# Patient Record
Sex: Male | Born: 1975 | Race: Black or African American | Hispanic: No | Smoking: Current every day smoker
Health system: Southern US, Community
[De-identification: ages and names within clinical notes are randomized; demographics above are authoritative.]

## PROBLEM LIST (undated history)

## (undated) DIAGNOSIS — I10 Essential (primary) hypertension: Secondary | ICD-10-CM

## (undated) DIAGNOSIS — F419 Anxiety disorder, unspecified: Secondary | ICD-10-CM

## (undated) DIAGNOSIS — M545 Low back pain, unspecified: Secondary | ICD-10-CM

## (undated) DIAGNOSIS — F32A Depression, unspecified: Secondary | ICD-10-CM

## (undated) DIAGNOSIS — G4700 Insomnia, unspecified: Secondary | ICD-10-CM

## (undated) HISTORY — DX: Depression, unspecified: F32.A

## (undated) HISTORY — DX: Insomnia, unspecified: G47.00

## (undated) HISTORY — DX: Anxiety disorder, unspecified: F41.9

---

## 1999-10-08 ENCOUNTER — Emergency Department (HOSPITAL_COMMUNITY): Admission: EM | Admit: 1999-10-08 | Discharge: 1999-10-08 | Payer: Self-pay | Admitting: Emergency Medicine

## 2000-02-02 ENCOUNTER — Emergency Department (HOSPITAL_COMMUNITY): Admission: EM | Admit: 2000-02-02 | Discharge: 2000-02-02 | Payer: Self-pay | Admitting: Emergency Medicine

## 2001-05-17 ENCOUNTER — Emergency Department (HOSPITAL_COMMUNITY): Admission: EM | Admit: 2001-05-17 | Discharge: 2001-05-17 | Payer: Self-pay | Admitting: Emergency Medicine

## 2002-07-09 ENCOUNTER — Emergency Department (HOSPITAL_COMMUNITY): Admission: EM | Admit: 2002-07-09 | Discharge: 2002-07-09 | Payer: Self-pay | Admitting: Emergency Medicine

## 2002-09-24 ENCOUNTER — Encounter: Admission: RE | Admit: 2002-09-24 | Discharge: 2002-09-24 | Payer: Self-pay | Admitting: Family Medicine

## 2003-04-26 ENCOUNTER — Emergency Department (HOSPITAL_COMMUNITY): Admission: AD | Admit: 2003-04-26 | Discharge: 2003-04-26 | Payer: Self-pay | Admitting: Family Medicine

## 2006-07-03 ENCOUNTER — Emergency Department (HOSPITAL_COMMUNITY): Admission: EM | Admit: 2006-07-03 | Discharge: 2006-07-03 | Payer: Self-pay | Admitting: Emergency Medicine

## 2006-07-13 DIAGNOSIS — R42 Dizziness and giddiness: Secondary | ICD-10-CM

## 2007-11-04 ENCOUNTER — Emergency Department (HOSPITAL_COMMUNITY): Admission: EM | Admit: 2007-11-04 | Discharge: 2007-11-04 | Payer: Self-pay | Admitting: Emergency Medicine

## 2011-12-20 ENCOUNTER — Emergency Department (HOSPITAL_COMMUNITY): Payer: Self-pay

## 2011-12-20 ENCOUNTER — Encounter (HOSPITAL_COMMUNITY): Payer: Self-pay | Admitting: *Deleted

## 2011-12-20 DIAGNOSIS — I10 Essential (primary) hypertension: Secondary | ICD-10-CM | POA: Insufficient documentation

## 2011-12-20 DIAGNOSIS — M545 Low back pain, unspecified: Secondary | ICD-10-CM | POA: Insufficient documentation

## 2011-12-20 DIAGNOSIS — F17200 Nicotine dependence, unspecified, uncomplicated: Secondary | ICD-10-CM | POA: Insufficient documentation

## 2011-12-20 DIAGNOSIS — R0602 Shortness of breath: Secondary | ICD-10-CM | POA: Insufficient documentation

## 2011-12-20 DIAGNOSIS — R079 Chest pain, unspecified: Principal | ICD-10-CM | POA: Insufficient documentation

## 2011-12-20 LAB — CBC
Hemoglobin: 15.6 g/dL (ref 13.0–17.0)
MCV: 90.8 fL (ref 78.0–100.0)
Platelets: 220 10*3/uL (ref 150–400)
RDW: 12.2 % (ref 11.5–15.5)
WBC: 6.8 10*3/uL (ref 4.0–10.5)

## 2011-12-20 NOTE — ED Notes (Signed)
Pt c/o left sided CP that radiates to the right for the pain 1-2 weeks. Associated with SOB, denies n/v, dizziness, diaphoresis.

## 2011-12-21 ENCOUNTER — Observation Stay (HOSPITAL_COMMUNITY)
Admission: EM | Admit: 2011-12-21 | Discharge: 2011-12-21 | Discharge status: Against Medical Advice | Payer: Self-pay | Attending: Emergency Medicine | Admitting: Emergency Medicine

## 2011-12-21 ENCOUNTER — Encounter (HOSPITAL_COMMUNITY): Payer: Self-pay | Admitting: Radiology

## 2011-12-21 ENCOUNTER — Emergency Department (HOSPITAL_COMMUNITY): Payer: Self-pay

## 2011-12-21 DIAGNOSIS — Z720 Tobacco use: Secondary | ICD-10-CM | POA: Diagnosis present

## 2011-12-21 DIAGNOSIS — F1020 Alcohol dependence, uncomplicated: Secondary | ICD-10-CM

## 2011-12-21 DIAGNOSIS — R030 Elevated blood-pressure reading, without diagnosis of hypertension: Secondary | ICD-10-CM

## 2011-12-21 DIAGNOSIS — IMO0001 Reserved for inherently not codable concepts without codable children: Secondary | ICD-10-CM | POA: Diagnosis present

## 2011-12-21 DIAGNOSIS — F17200 Nicotine dependence, unspecified, uncomplicated: Secondary | ICD-10-CM

## 2011-12-21 DIAGNOSIS — R079 Chest pain, unspecified: Secondary | ICD-10-CM

## 2011-12-21 HISTORY — DX: Low back pain: M54.5

## 2011-12-21 HISTORY — DX: Essential (primary) hypertension: I10

## 2011-12-21 HISTORY — DX: Chest pain, unspecified: R07.9

## 2011-12-21 HISTORY — DX: Low back pain, unspecified: M54.50

## 2011-12-21 LAB — POCT I-STAT TROPONIN I: Troponin i, poc: 0 ng/mL (ref 0.00–0.08)

## 2011-12-21 LAB — BASIC METABOLIC PANEL
BUN: 12 mg/dL (ref 6–23)
CO2: 29 mEq/L (ref 19–32)
Calcium: 9.7 mg/dL (ref 8.4–10.5)
Chloride: 98 mEq/L (ref 96–112)

## 2011-12-21 MED ORDER — IOHEXOL 350 MG/ML SOLN
100.0000 mL | Freq: Once | INTRAVENOUS | Status: AC | PRN
  Administered 2011-12-21: 100 mL via INTRAVENOUS

## 2011-12-21 NOTE — ED Provider Notes (Signed)
History     CSN: [id number]  Arrival date & time 12/20/11  2324   First MD Initiated Contact with Patient 12/21/11 0221      Chief Complaint  Patient presents with  . Chest Pain    (Consider location/radiation/quality/duration/timing/severity/associated sxs/prior treatment) HPI History provided by patient. Last week has been having off chest pains described as tightness across his chest. Has some associated shortness of breath. No leg pain or swelling. No significant cough. No fevers or chills. No recent illness. No recent travel or surgery or hospitalizations. Is a smoker no history of asthma or COPD. No history of similar symptoms. No history of heart problems. Past Medical History  Diagnosis Date  . Lower back pain   . Hypertension     History reviewed. No pertinent past surgical history.  History reviewed. No pertinent family history.  History  Substance Use Topics  . Smoking status: Current Everyday Smoker -- 1.0 packs/day  . Smokeless tobacco: Not on file  . Alcohol Use: Yes      Review of Systems  Constitutional: Negative for fever and chills.  HENT: Negative for neck pain and neck stiffness.   Eyes: Negative for pain.  Respiratory: Positive for chest tightness.   Cardiovascular: Positive for chest pain. Negative for palpitations and leg swelling.  Gastrointestinal: Negative for abdominal pain.  Genitourinary: Negative for dysuria.  Musculoskeletal: Negative for back pain.  Skin: Negative for rash.  Neurological: Negative for headaches.  All other systems reviewed and are negative.    Allergies  Review of patient's allergies indicates no known allergies.  Home Medications   Current Outpatient Rx  Name Route Sig Dispense Refill  . OXYCODONE-ACETAMINOPHEN 10-325 MG PO TABS Oral Take 1 tablet by mouth every 4 (four) hours as needed. For pain      BP 147/88  Pulse 85  Temp 98.6 F (37 C) (Oral)  Resp 17  SpO2 100%  Physical Exam  Constitutional:  He is oriented to person, place, and time. He appears well-developed and well-nourished.  HENT:  Head: Normocephalic and atraumatic.  Eyes: Conjunctivae and EOM are normal. Pupils are equal, round, and reactive to light.  Neck: Trachea normal. Neck supple. No thyromegaly present.  Cardiovascular: Regular rhythm, S1 normal, S2 normal and normal pulses.     No systolic murmur is present   No diastolic murmur is present  Pulses:      Radial pulses are 2+ on the right side, and 2+ on the left side.       Mild tachycardia  Pulmonary/Chest: Effort normal and breath sounds normal. He has no wheezes. He has no rhonchi. He has no rales. He exhibits no tenderness.  Abdominal: Soft. Normal appearance and bowel sounds are normal. There is no tenderness. There is no CVA tenderness and negative Murphy's sign.  Musculoskeletal:       BLE:s Calves nontender, no cords or erythema, negative Homans sign  Neurological: He is alert and oriented to person, place, and time. He has normal strength. No cranial nerve deficit or sensory deficit. GCS eye subscore is 4. GCS verbal subscore is 5. GCS motor subscore is 6.  Skin: Skin is warm and dry. No rash noted. He is not diaphoretic.  Psychiatric: His speech is normal.       Cooperative and appropriate    ED Course  Procedures (including critical care time)  Results for orders placed during the hospital encounter of 12/21/11  CBC      Component Value Range  WBC 6.8  4.0 - 10.5 K/uL   RBC 4.78  4.22 - 5.81 MIL/uL   Hemoglobin 15.6  13.0 - 17.0 g/dL   HCT 16.1  09.6 - 04.5 %   MCV 90.8  78.0 - 100.0 fL   MCH 32.6  26.0 - 34.0 pg   MCHC 35.9  30.0 - 36.0 g/dL   RDW 40.9  81.1 - 91.4 %   Platelets 220  150 - 400 K/uL  BASIC METABOLIC PANEL      Component Value Range   Sodium 136  135 - 145 mEq/L   Potassium 3.9  3.5 - 5.1 mEq/L   Chloride 98  96 - 112 mEq/L   CO2 29  19 - 32 mEq/L   Glucose, Bld 103 (*) 70 - 99 mg/dL   BUN 12  6 - 23 mg/dL    Creatinine, Ser 7.82  0.50 - 1.35 mg/dL   Calcium 9.7  8.4 - 95.6 mg/dL   GFR calc non Af Amer 69 (*) >90 mL/min   GFR calc Af Amer 80 (*) >90 mL/min  POCT I-STAT TROPONIN I      Component Value Range   Troponin i, poc 0.00  0.00 - 0.08 ng/mL   Comment 3            Dg Chest 2 View  12/20/2011  *RADIOLOGY REPORT*  Clinical Data: Chest pain, shortness of breath, hypertension  CHEST - 2 VIEW  Comparison: 07/03/2006  Findings: Slight central bronchial changes and interstitial prominence.  Low lung volumes evident.  No focal pneumonia, edema, collapse, consolidation, effusion or pneumothorax.  Trachea midline.  Normal heart size and vascularity.  IMPRESSION: Low volume exam with central bronchitic changes.  Original Report Authenticated By: Judie Petit. Ruel Favors, M.D.      Date: 12/21/2011  Rate: 105  Rhythm: sinus tachycardia  QRS Axis: normal  Intervals: normal  ST/T Wave abnormalities: nonspecific ST/T changes  Conduction Disutrbances:none  Narrative Interpretation: LVH  Old EKG Reviewed: none available   EKG reviewed with cardiologist Dr. Lurline Idol feels this is likely LVH related changes and recommends admit likely need stress echo.  EKG labs and imaging reviewed as above. Nursing consult for admission.  5:03 AM d/w Dr Toniann Fail who agrees to admit  MDM   Chest pain evaluated with EKG labs and chest x-ray. Troponins negative.  abnormal EKG. Plan Medical admission.        Sunnie Nielsen, MD 12/21/11 [phone]

## 2011-12-21 NOTE — ED Notes (Signed)
Pt requesting to sign out AMA stating he was not prepared to stay due to having kids and needing to work. Explained to pt why the Dr wanted him to stay for 24 hr observation, pt stated he still wanted to sign out, Dr Dierdre Highman notified.

## 2011-12-21 NOTE — ED Notes (Signed)
Pt signed out AMA stating he was not prepared to stay now but he would come back if he started hurting again. Pt informed of risk of leaving, pt verbalized understanding of these risk. Pt told to follow up with his PCP for possible referral to cardiologist and to return if he beings to have chest pain again. Pt verbalized understanding of these instructions.

## 2011-12-21 NOTE — H&P (Signed)
Jonathan [name] is an 36 y.o. male.   PCP - none. Chief Complaint: Chest pain. HPI: 36 year old male with history of chronic low back pain on pain relief medications present with complaints of chest pain. Patient has been experiencing chest pain all last week and half. The chest pain is more on the left anterior chest wall nonradiating pressure-like has no relation to exertion and no associated shortness of breath. Since it is becoming more frequent he decided to come to the ER. In the ER cardiac enzymes are negative EKG was showing features of LVH. Patient at this time is admitted for further workup. Patient denies any nausea vomiting abdominal pain dizziness or loss of consciousness.    Past Medical History  Diagnosis Date  . Lower back pain   . Hypertension     History reviewed. No pertinent past surgical history.  Family History  Problem Relation Age of Onset  . Hypertension Other    Social History:  reports that he has been smoking.  He does not have any smokeless tobacco history on file. He reports that he drinks alcohol. He reports that he uses illicit drugs (Marijuana).  Allergies: No Known Allergies   (Not in a hospital admission)  Results for orders placed during the hospital encounter of 12/21/11 (from the past 48 hour(s))  CBC     Status: Normal   Collection Time   12/20/11 11:32 PM      Component Value Range Comment   WBC 6.8  4.0 - 10.5 K/uL    RBC 4.78  4.22 - 5.81 MIL/uL    Hemoglobin 15.6  13.0 - 17.0 g/dL    HCT 96.0  45.4 - 09.8 %    MCV 90.8  78.0 - 100.0 fL    MCH 32.6  26.0 - 34.0 pg    MCHC 35.9  30.0 - 36.0 g/dL    RDW 11.9  14.7 - 82.9 %    Platelets 220  150 - 400 K/uL   BASIC METABOLIC PANEL     Status: Abnormal   Collection Time   12/20/11 11:32 PM      Component Value Range Comment   Sodium 136  135 - 145 mEq/L    Potassium 3.9  3.5 - 5.1 mEq/L    Chloride 98  96 - 112 mEq/L    CO2 29  19 - 32 mEq/L    Glucose, Bld 103 (*) 70 - 99 mg/dL    BUN 12  6 - 23 mg/dL    Creatinine, Ser 5.62  0.50 - 1.35 mg/dL    Calcium 9.7  8.4 - 13.0 mg/dL    GFR calc non Af Amer 69 (*) >90 mL/min    GFR calc Af Amer 80 (*) >90 mL/min   POCT I-STAT TROPONIN I     Status: Normal   Collection Time   12/20/11 11:38 PM      Component Value Range Comment   Troponin i, poc 0.00  0.00 - 0.08 ng/mL    Comment 3            POCT I-STAT TROPONIN I     Status: Normal   Collection Time   12/21/11  3:07 AM      Component Value Range Comment   Troponin i, poc 0.00  0.00 - 0.08 ng/mL    Comment 3             Dg Chest 2 View  12/20/2011  *RADIOLOGY REPORT*  Clinical Data:  Chest pain, shortness of breath, hypertension  CHEST - 2 VIEW  Comparison: 07/03/2006  Findings: Slight central bronchial changes and interstitial prominence.  Low lung volumes evident.  No focal pneumonia, edema, collapse, consolidation, effusion or pneumothorax.  Trachea midline.  Normal heart size and vascularity.  IMPRESSION: Low volume exam with central bronchitic changes.  Original Report Authenticated By: Judie Petit. Ruel Favors, M.D.   Ct Angio Chest Pe W/cm &/or Wo Cm  12/21/2011  *RADIOLOGY REPORT*  Clinical Data: Left-sided chest pain radiating to the right side for 1 - 2 weeks.  CT ANGIOGRAPHY CHEST  Technique:  Multidetector CT imaging of the chest using the standard protocol during bolus administration of intravenous contrast. Multiplanar reconstructed images including MIPs were obtained and reviewed to evaluate the vascular anatomy.  Contrast: OMNIPAQUE IOHEXOL 350 MG/ML SOLN  Comparison: None.  Findings: Technically adequate study with good opacification of the central and segmental pulmonary arteries.  No focal filling defects.  No evidence of significant pulmonary embolus.  Normal heart size.  Normal caliber thoracic aorta.  Mild infiltration in the anterior mediastinal fat likely represents residual thymic tissue.  No significant lymphadenopathy in the chest.  Esophagus is decompressed.   No pleural effusions.  Dependent atelectatic changes in the lung bases.  Scattered emphysematous changes.  No focal airspace consolidation.  No interstitial changes.  No pneumothorax. Airways appear patent.  IMPRESSION: No evidence of significant pulmonary embolus.  Original Report Authenticated By: Marlon Pel, M.D.    ROS  Blood pressure 144/86, pulse 76, temperature 98.6 F (37 C), temperature source Oral, resp. rate 16, SpO2 99.00%. Physical Exam   Assessment/Plan #1. Chest pain to rule out ACS - cycle cardiac markers. 2-D echo. Aspirin. When necessary nitroglycerin. #2. Elevated blood pressure - closely monitor blood pressure. If it stands elevated then may definite antihypertensives. #3. Tobacco abuse - strongly advised to quit smoking. #4. Alcoholism - patient will be placed on alcohol withdrawal protocol.  CODE STATUS - full code.  Natasa Stigall N. 12/21/2011, 5:45 AM

## 2013-04-27 ENCOUNTER — Encounter (HOSPITAL_COMMUNITY): Payer: Self-pay | Admitting: Emergency Medicine

## 2013-04-27 ENCOUNTER — Emergency Department (INDEPENDENT_AMBULATORY_CARE_PROVIDER_SITE_OTHER): Payer: Self-pay

## 2013-04-27 ENCOUNTER — Emergency Department (HOSPITAL_COMMUNITY): Payer: Self-pay

## 2013-04-27 ENCOUNTER — Emergency Department (INDEPENDENT_AMBULATORY_CARE_PROVIDER_SITE_OTHER)
Admission: EM | Admit: 2013-04-27 | Discharge: 2013-04-27 | Disposition: A | Discharge status: Home / Self Care | Payer: Self-pay | Source: Home / Self Care | Attending: Family Medicine | Admitting: Family Medicine

## 2013-04-27 DIAGNOSIS — S62309A Unspecified fracture of unspecified metacarpal bone, initial encounter for closed fracture: Secondary | ICD-10-CM

## 2013-04-27 DIAGNOSIS — S62339A Displaced fracture of neck of unspecified metacarpal bone, initial encounter for closed fracture: Secondary | ICD-10-CM

## 2013-04-27 NOTE — ED Notes (Signed)
Patient punched a wall with his right hand approx 2 days ago

## 2013-04-27 NOTE — ED Provider Notes (Signed)
Jonathan [name] is a 37 y.o. male who presents to Urgent Care today for right hand injury. Patient punched a wall about 2 days ago. He suspects that he is a fracture at the fifth metacarpal. He notes that initially it was displaced downward that he was able to push it up to normal position after the injury. He notes pain and swelling but otherwise feels well.  No radiating pain weakness or numbness   Past Medical History  Diagnosis Date  . Lower back pain   . Hypertension    History  Substance Use Topics  . Smoking status: Current Every Day Smoker -- 1.00 packs/day  . Smokeless tobacco: Not on file  . Alcohol Use: Yes   ROS as above Medications reviewed. No current facility-administered medications for this encounter.   No current outpatient prescriptions on file.    Exam:  BP 138/84  Pulse 81  Resp 18 Gen: Well NAD RIGHT HAND SWELLING AND TENDERNESS AT THE DISTAL FIFTH METACARPAL. No angular deformity. Does not appear to be displaced. No rotational deformity noted. Hand motion sensation and capillary refill are intact  No results found for this or any previous visit (from the past 24 hour(s)). Dg Hand Complete Right  04/27/2013   CLINICAL DATA:  Punching injury, pain/swelling to the 5th metacarpal  EXAM: RIGHT HAND - COMPLETE 3+ VIEW  COMPARISON:  None.  FINDINGS: Mildly comminuted, angulated fracture of the distal 5th metacarpal. Apex dorsal angulation.  No additional fracture is seen.  Associated dorsal/ulnar soft tissue swelling.  IMPRESSION: Mildly comminuted distal 5th metacarpal fracture.   Electronically Signed   By: Charline Bills M.D.   On: 04/27/2013 19:51    Patient was placed into a well-formed ulnar gutter splint in an anatomical hand position.  Assessment and Plan: 37 y.o. male with minimally radially angulated comminuted fifth metacarpal boxer's fracture. Patient was placed into an ulnar gutter splint and will followup with orthopedics in about one  week. Discussed warning signs or symptoms. Please see discharge instructions. Patient expresses understanding.      Rodolph Bong, MD 04/27/13 2025

## 2016-06-11 ENCOUNTER — Encounter (HOSPITAL_COMMUNITY): Payer: Self-pay | Admitting: Family Medicine

## 2016-06-11 ENCOUNTER — Ambulatory Visit (HOSPITAL_COMMUNITY)
Admission: EM | Admit: 2016-06-11 | Discharge: 2016-06-11 | Disposition: A | Discharge status: Home / Self Care | Payer: Self-pay | Attending: Family Medicine | Admitting: Family Medicine

## 2016-06-11 DIAGNOSIS — M542 Cervicalgia: Secondary | ICD-10-CM
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
## 2018-07-18 ENCOUNTER — Ambulatory Visit (HOSPITAL_COMMUNITY)
Admission: EM | Admit: 2018-07-18 | Discharge: 2018-07-18 | Disposition: A | Discharge status: Home / Self Care | Payer: Self-pay | Attending: Family Medicine | Admitting: Family Medicine

## 2018-07-18 ENCOUNTER — Ambulatory Visit (INDEPENDENT_AMBULATORY_CARE_PROVIDER_SITE_OTHER): Payer: Self-pay

## 2018-07-18 ENCOUNTER — Encounter (HOSPITAL_COMMUNITY): Payer: Self-pay | Admitting: Emergency Medicine

## 2018-07-18 DIAGNOSIS — J189 Pneumonia, unspecified organism: Secondary | ICD-10-CM

## 2018-07-18 MED ORDER — ALBUTEROL SULFATE HFA 108 (90 BASE) MCG/ACT IN AERS
2.0000 | INHALATION_SPRAY | Freq: Once | RESPIRATORY_TRACT | Status: AC
  Administered 2018-07-18: 2 via RESPIRATORY_TRACT

## 2018-07-18 MED ORDER — IPRATROPIUM-ALBUTEROL 0.5-2.5 (3) MG/3ML IN SOLN
3.0000 mL | Freq: Once | RESPIRATORY_TRACT | Status: AC
  Administered 2018-07-18: 3 mL via RESPIRATORY_TRACT

## 2018-07-18 MED ORDER — ALBUTEROL SULFATE HFA 108 (90 BASE) MCG/ACT IN AERS
INHALATION_SPRAY | RESPIRATORY_TRACT | Status: AC
  Filled 2018-07-18: qty 6.7

## 2018-07-18 MED ORDER — IPRATROPIUM-ALBUTEROL 0.5-2.5 (3) MG/3ML IN SOLN
RESPIRATORY_TRACT | Status: AC
  Filled 2018-07-18: qty 3

## 2018-07-18 MED ORDER — LEVOFLOXACIN 750 MG PO TABS: 750.0000 mg | ORAL_TABLET | Freq: Every day | ORAL | 0 refills | Status: DC

## 2018-07-18 NOTE — ED Notes (Signed)
Dr. Tracie Harrier at bedside

## 2018-07-18 NOTE — ED Triage Notes (Signed)
Pt c/o cough, chest congestion x1 week, fatigue. Saturations 91% on RA, 94% after takign some deep breaths.

## 2018-07-19 NOTE — ED Provider Notes (Signed)
[facility]   [id number] 07/18/18 Arrival Time: 1550  ASSESSMENT & PLAN:  1. Pneumonia of both lungs due to infectious organism, unspecified part of lung    I have personally viewed the imaging studies ordered this visit. Bilateral multifocal PNA.  Discussed ED evaluation but after neb, O2 sats stable at 95%. No respiratory distress.  Meds ordered this encounter  Medications  . ipratropium-albuterol (DUONEB) 0.5-2.5 (3) MG/3ML nebulizer solution 3 mL  . albuterol (PROVENTIL HFA;VENTOLIN HFA) 108 (90 Base) MCG/ACT inhaler 2 puff  . levofloxacin (LEVAQUIN) 750 MG tablet    Sig: Take 1 tablet (750 mg total) by mouth daily.    Dispense:  7 tablet    Refill:  0   Agrees to begin taking Levaquin tonight. Will use inhaler as needed. Agrees to strict return precautions; to ED with any worsening.  Will f/u here in 36 hours for recheck.  Reviewed expectations re: course of current medical issues. Questions answered. Outlined signs and symptoms indicating need for more acute intervention. Patient verbalized understanding. After Visit Summary given.   SUBJECTIVE:  History from: patient. Jonathan [name] is a 43 y.o. male who presents with complaint of persistent coughing and "feeling bad" in general for the past week. Nasal congestion. Chest beginning to hurt from coughing. Over the past two days has felt SOB. No wheezing reported. SOB worse with exertion. No back pain. Does not think he has run a fever but does report chills at night. Difficulty sleeping secondary to coughing. Is a smoker. No abdominal pain. Tolerating PO intake without n/v. No LE edema. Ambulatory without assistance. No recent travel. No known sick contacts. No specific aggravating or alleviating factors reported except those listed above. No OTC treatment. No h/o frequent infections or lung problems.  ROS: As per HPI. All other systems negative.   OBJECTIVE:  Vitals:   07/18/18 1704 07/18/18 1706  BP:  110/67   Pulse: 83   Resp:  (!) 22  Temp: 99.1 F (37.3 C)   SpO2: 91% 94%    General appearance: appears fatigued Eyes: PERRLA; EOMI; conjunctivae normal HENT: normocephalic; atraumatic; oropharynx moist Neck: supple with FROM Lungs: initially with somewhat labored respirations that have improved after DuoNeb treatment here; very coarse breath sounds diffusely; mild expiratory wheezing Heart: regular rate and rhythm without murmer Abdomen: soft, non-tender Extremities: without edema; without calf swelling or tenderness; symmetrical without gross deformities Skin: warm and dry; without rash or lesions Psychological: alert and cooperative; normal mood and affect  Imaging: Dg Chest 2 View  Result Date: 07/18/2018 CLINICAL DATA:  Productive cough and fever. EXAM: CHEST - 2 VIEW COMPARISON:  12/20/2011 FINDINGS: The heart size and mediastinal contours are within normal limits. Extensive patchy airspace disease is seen throughout both lungs with some associated bronchial thickening. Findings are likely consistent with multifocal pneumonia. No overt pulmonary edema or pleural fluid. No pneumothorax. The visualized skeletal structures are unremarkable. IMPRESSION: Evidence of multifocal bilateral pneumonia. Electronically Signed   By: Irish Lack M.D.   On: 07/18/2018 17:54     No Known Allergies  Past Medical History:  Diagnosis Date  . Hypertension   . Lower back pain    Social History   Socioeconomic History  . Marital status: Married    Spouse name: Not on file  . Number of children: Not on file  . Years of education: Not on file  . Highest education level: Not on file  Occupational History  . Not on file  Social  Needs  . Financial resource strain: Not on file  . Food insecurity:    Worry: Not on file    Inability: Not on file  . Transportation needs:    Medical: Not on file    Non-medical: Not on file  Tobacco Use  . Smoking status: Current Every Day Smoker     Packs/day: 1.00  . Smokeless tobacco: Never Used  Substance and Sexual Activity  . Alcohol use: Yes  . Drug use: Yes    Types: Marijuana  . Sexual activity: Not on file  Lifestyle  . Physical activity:    Days per week: Not on file    Minutes per session: Not on file  . Stress: Not on file  Relationships  . Social connections:    Talks on phone: Not on file    Gets together: Not on file    Attends religious service: Not on file    Active member of club or organization: Not on file    Attends meetings of clubs or organizations: Not on file    Relationship status: Not on file  . Intimate partner violence:    Fear of current or ex partner: Not on file    Emotionally abused: Not on file    Physically abused: Not on file    Forced sexual activity: Not on file  Other Topics Concern  . Not on file  Social History Narrative  . Not on file   Family History  Problem Relation Age of Onset  . Hypertension Other    History reviewed. No pertinent surgical history.   Mardella Layman, MD 07/19/18 1226

## 2021-06-21 ENCOUNTER — Other Ambulatory Visit: Payer: Self-pay

## 2021-06-21 ENCOUNTER — Ambulatory Visit (HOSPITAL_COMMUNITY)
Admission: EM | Admit: 2021-06-21 | Discharge: 2021-06-21 | Disposition: A | Discharge status: Home / Self Care | Payer: 59 | Attending: Family Medicine | Admitting: Family Medicine

## 2021-06-21 ENCOUNTER — Encounter (HOSPITAL_COMMUNITY): Payer: Self-pay | Admitting: Emergency Medicine

## 2021-06-21 DIAGNOSIS — I10 Essential (primary) hypertension: Secondary | ICD-10-CM

## 2021-06-21 DIAGNOSIS — R200 Anesthesia of skin: Secondary | ICD-10-CM

## 2021-06-21 MED ORDER — TRAZODONE HCL 50 MG PO TABS: 25.0000 mg | ORAL_TABLET | Freq: Every day | ORAL | 0 refills | Status: DC

## 2021-06-21 MED ORDER — LISINOPRIL 20 MG PO TABS: 20.0000 mg | ORAL_TABLET | Freq: Every day | ORAL | 0 refills | Status: DC

## 2021-06-21 NOTE — ED Provider Notes (Signed)
[facility]    CSN: [id number] Arrival date & time: 06/21/21  1419      History   Chief Complaint Chief Complaint  Patient presents with   facial numbness    HPI Jonathan [name] is a 46 y.o. male.   HPI Here for numbness on his right cheek and right chin in the last 1 to 2 weeks.  He has also noticed numbness in his right hand and arm.  No trouble speaking.  No muscle weakness noted.  A few months ago he had had some right thigh numbness that has improved but it is still lingering in his mid anterior thigh. He does have a history of hypertension and used to take lisinopril 20 mg.  His blood pressure here is 184/75.  He asked about pancreas problems and if that could cause his symptoms.  He also asked about a blood clot in his leg, if that could be causing his right thigh numbness.  He does admit to some stress with his mother having cancer.  He is not sleeping well; some nights he does not sleep at all.  No suicidal thoughts Past Medical History:  Diagnosis Date   Hypertension    Lower back pain     Patient Active Problem List   Diagnosis Date Noted   Chest pain 12/21/2011   Tobacco abuse 12/21/2011   Elevated blood pressure 12/21/2011   Alcoholism (HCC) 12/21/2011   VERTIGO NOS OR DIZZINESS 07/13/2006    History reviewed. No pertinent surgical history.     Home Medications    Prior to Admission medications   Medication Sig Start Date End Date Taking? Authorizing Provider  aspirin 325 MG tablet Take 325 mg by mouth daily.   Yes [provider]  lisinopril (ZESTRIL) 20 MG tablet Take 1 tablet (20 mg total) by mouth daily. 06/21/21  Yes Zenia Resides, MD  traZODone (DESYREL) 50 MG tablet Take 0.5-2 tablets (25-100 mg total) by mouth at bedtime. 06/21/21  Yes Zenia Resides, MD    Family History Family History  Problem Relation Age of Onset   Cancer Mother    Hypertension Other     Social History Social History   Tobacco Use    Smoking status: Every Day    Packs/day: 1.00    Types: Cigarettes   Smokeless tobacco: Never  Vaping Use   Vaping Use: Never used  Substance Use Topics   Alcohol use: Yes   Drug use: Yes    Types: Marijuana     Allergies   Patient has no known allergies.   Review of Systems Review of Systems   Physical Exam Triage Vital Signs ED Triage Vitals  Enc Vitals Group     BP 06/21/21 1657 (!) 184/75     Pulse Rate 06/21/21 1657 80     Resp 06/21/21 1657 (!) 21     Temp 06/21/21 1657 98.9 F (37.2 C)     Temp Source 06/21/21 1657 Oral     SpO2 06/21/21 1657 99 %     Weight --      Height --      Head Circumference --      Peak Flow --      Pain Score 06/21/21 1654 4     Pain Loc --      Pain Edu? --      Excl. in GC? --    No data found.  Updated Vital Signs BP (!) 184/75 (BP Location:  Right Arm) Comment (BP Location): large cuff   Pulse 80    Temp 98.9 F (37.2 C) (Oral)    Resp (!) 21    SpO2 99%   Visual Acuity Right Eye Distance:   Left Eye Distance:   Bilateral Distance:    Right Eye Near:   Left Eye Near:    Bilateral Near:     Physical Exam Vitals reviewed.  Constitutional:      General: He is not in acute distress.    Appearance: He is not toxic-appearing.  HENT:     Nose: Nose normal.     Mouth/Throat:     Mouth: Mucous membranes are moist.     Pharynx: No oropharyngeal exudate or posterior oropharyngeal erythema.  Eyes:     Extraocular Movements: Extraocular movements intact.     Conjunctiva/sclera: Conjunctivae normal.     Pupils: Pupils are equal, round, and reactive to light.  Cardiovascular:     Rate and Rhythm: Normal rate and regular rhythm.     Heart sounds: No murmur heard. Pulmonary:     Effort: Pulmonary effort is normal.     Breath sounds: Normal breath sounds.  Musculoskeletal:        General: No swelling or tenderness.     Cervical back: Neck supple.     Right lower leg: No edema.     Left lower leg: No edema.   Lymphadenopathy:     Cervical: No cervical adenopathy.  Skin:    Capillary Refill: Capillary refill takes less than 2 seconds.     Coloration: Skin is not jaundiced or pale.     Findings: No rash.  Neurological:     General: No focal deficit present.     Mental Status: He is alert and oriented to person, place, and time.     Cranial Nerves: No cranial nerve deficit.     Sensory: Sensory deficit (decrease in LT over right cheek) present.     Motor: No weakness.     Coordination: Coordination normal.     Gait: Gait normal.     Deep Tendon Reflexes: Reflexes normal.     Comments: No facial droop. Speech normal.  Psychiatric:        Behavior: Behavior normal.     UC Treatments / Results  Labs (all labs ordered are listed, but only abnormal results are displayed) Labs Reviewed - No data to display  EKG   Radiology No results found.  Procedures Procedures (including critical care time)  Medications Ordered in UC Medications - No data to display  Initial Impression / Assessment and Plan / UC Course  I have reviewed the triage vital signs and the nursing notes.  Pertinent labs & imaging results that were available during my care of the patient were reviewed by me and considered in my medical decision making (see chart for details).     I will restart his lisinopril to help with blood pressure.  Also will give him a few trazodone to try to help him rest for a few days.  Also help will be requested to help him find a primary doctor Final Clinical Impressions(s) / UC Diagnoses   Final diagnoses:  Right facial numbness  Essential hypertension, benign     Discharge Instructions      Lisinopril 20 mg 1 daily for blood pressure. Trazodone 50 mg, begin with one half nightly and titrate up for effect to help you rest.  If it is not helping at 100  mg (or 2 tablets nightly), then stop taking it  If you begin having muscle weakness of 1 side of your body or more severe  headache, proceed to the emergency room     ED Prescriptions     Medication Sig Dispense Auth. Provider   lisinopril (ZESTRIL) 20 MG tablet Take 1 tablet (20 mg total) by mouth daily. 30 tablet Zenia Resides, MD   traZODone (DESYREL) 50 MG tablet Take 0.5-2 tablets (25-100 mg total) by mouth at bedtime. 20 tablet Jaedin Regina, Janace Aris, MD      PDMP not reviewed this encounter.   Zenia Resides, MD 06/21/21 1739

## 2021-06-21 NOTE — Discharge Instructions (Addendum)
Lisinopril 20 mg 1 daily for blood pressure. Trazodone 50 mg, begin with one half nightly and titrate up for effect to help you rest.  If it is not helping at 100 mg (or 2 tablets nightly), then stop taking it  If you begin having muscle weakness of 1 side of your body or more severe headache, proceed to the emergency room

## 2021-06-21 NOTE — ED Triage Notes (Signed)
Right side of face numbness for one week.  Area to right cheek bone, right upper lip and area of right jaw.  .  Fingers are numb, flexion of elbow causes cramping in upper arm.    Patient reports right thigh has residual numbness from a similar episode "months ago".   Denies chest pain.  patient reports seeing black spots and dots for 3 days intermittently.  Patient has not taken blood pressure medicine for 2 years.

## 2021-11-01 ENCOUNTER — Ambulatory Visit (HOSPITAL_COMMUNITY)
Admission: EM | Admit: 2021-11-01 | Discharge: 2021-11-01 | Disposition: A | Discharge status: Home / Self Care | Payer: 59 | Attending: Internal Medicine | Admitting: Internal Medicine

## 2021-11-01 ENCOUNTER — Encounter (HOSPITAL_COMMUNITY): Payer: Self-pay

## 2021-11-01 ENCOUNTER — Ambulatory Visit (INDEPENDENT_AMBULATORY_CARE_PROVIDER_SITE_OTHER): Payer: 59

## 2021-11-01 DIAGNOSIS — I160 Hypertensive urgency: Secondary | ICD-10-CM | POA: Diagnosis not present

## 2021-11-01 DIAGNOSIS — S39012A Strain of muscle, fascia and tendon of lower back, initial encounter: Secondary | ICD-10-CM

## 2021-11-01 DIAGNOSIS — M25552 Pain in left hip: Secondary | ICD-10-CM

## 2021-11-01 DIAGNOSIS — I10 Essential (primary) hypertension: Secondary | ICD-10-CM | POA: Diagnosis not present

## 2021-11-01 MED ORDER — TIZANIDINE HCL 4 MG PO TABS: 4.0000 mg | ORAL_TABLET | Freq: Four times a day (QID) | ORAL | 0 refills | Status: DC | PRN

## 2021-11-01 MED ORDER — IBUPROFEN 800 MG PO TABS: 800.0000 mg | ORAL_TABLET | Freq: Three times a day (TID) | ORAL | 0 refills | Status: DC

## 2021-11-01 MED ORDER — AMLODIPINE BESYLATE 5 MG PO TABS: 5.0000 mg | ORAL_TABLET | Freq: Every day | ORAL | 3 refills | Status: DC

## 2021-11-01 NOTE — Discharge Instructions (Addendum)
Your hip x-ray was negative for fracture today.   Take ibuprofen 800 mg every 8 hours for pain and inflammation.  Apply heat to area of greatest tenderness to reduce muscle spasm.    Take Tizanidine muscle relaxer by mouth every 6 hours as needed for muscle spasms.  Do not take this medication and drive or go to work or drink alcohol as it can make you very sleepy.  Follow-up with [facility] as needed if your symptoms are not improving in the next 1 to 2 weeks.   Stop taking lisinopril and start taking amlodipine 5 mg every single day.  It is very important that you lower your blood pressure to prevent kidney damage and eye damage.  High blood pressure for long periods of time can lead to heart attack and stroke.   I have initiated PCP assistance for you today.  You may also go to [email or web address] to schedule a primary care appointment.  Work note is at the end of this packet.  If you develop any new or worsening symptoms or do not improve in the next 2 to 3 days, please return.  If your symptoms are severe, please go to the emergency room.  Follow-up with your primary care provider for further evaluation and management of your symptoms as well as ongoing wellness visits.  I hope you feel better!

## 2021-11-01 NOTE — ED Provider Notes (Incomplete)
[facility]    CSN: [id number] Arrival date & time: 11/01/21  1737      History   Chief Complaint Chief Complaint  Patient presents with   Back Pain   Hip Pain    HPI Jonathan [name] is a 46 y.o. male.   Patient presents to urgent care for evaluation of his left hip pain after he fell 7 days ago while in [facility] due to a toilet leak in his hotel room.  Patient did not see water on the floor, and slipped and fell onto his left hip/left back area.  He was able to stand up and walk after the fall, but walked with an unsteady gait and a limp.  He did not take much medication for his pain until he got home on Saturday, October 30, 2021 when he started taking two 800 mg ibuprofen tablets every 4 hours.  He has been taking this amount consistently for the last 2 days.  Last dose of 1600mg  of ibuprofen was 1 hour ago with mild improvement.  Pain to his left hip/femur is a 7 on a scale of 0-10 at this time.  Low back pain is at a 5 on a scale of 0-10.  He denies radiculopathy, numbness and tingling to bilateral lower extremities, and bowel/bladder incontinence.  Pain is worse with sitting position.  No other aggravating or relieving factors identified at this time.  Patient is noted to have elevated blood pressure in clinic today at 193/125.  He was prescribed lisinopril at urgent care in February 2023.  He states that his last dose of lisinopril was 1 week ago, then 2 months prior to that.  He states that the lisinopril makes his head feel "weird".  He is also reporting intermittent blurry vision and "seeing spots" but attributes this to high blood pressure.    Back Pain Hip Pain    Past Medical History:  Diagnosis Date   Hypertension    Lower back pain     Patient Active Problem List   Diagnosis Date Noted   Chest pain 12/21/2011   Tobacco abuse 12/21/2011   Elevated blood pressure 12/21/2011   Alcoholism (HCC) 12/21/2011   VERTIGO NOS OR DIZZINESS 07/13/2006     History reviewed. No pertinent surgical history.     Home Medications    Prior to Admission medications   Medication Sig Start Date End Date Taking? Authorizing Provider  amLODipine (NORVASC) 5 MG tablet Take 1 tablet (5 mg total) by mouth daily. 11/01/21  Yes 11/03/21, FNP  ibuprofen (ADVIL) 800 MG tablet Take 1 tablet (800 mg total) by mouth 3 (three) times daily. 11/01/21  Yes 11/03/21, FNP  tiZANidine (ZANAFLEX) 4 MG tablet Take 1 tablet (4 mg total) by mouth every 6 (six) hours as needed for muscle spasms. 11/01/21  Yes 11/03/21, FNP  aspirin 325 MG tablet Take 325 mg by mouth daily.    [provider]  traZODone (DESYREL) 50 MG tablet Take 0.5-2 tablets (25-100 mg total) by mouth at bedtime. 06/21/21   08/19/21, MD    Family History Family History  Problem Relation Age of Onset   Cancer Mother    Hypertension Other     Social History Social History   Tobacco Use   Smoking status: Every Day    Packs/day: 1.00    Types: Cigarettes   Smokeless tobacco: Never  Vaping Use   Vaping Use: Never used  Substance  Use Topics   Alcohol use: Yes   Drug use: Yes    Types: Marijuana     Allergies   Patient has no known allergies.   Review of Systems Review of Systems  Musculoskeletal:  Positive for back pain.   Per HPI  Physical Exam Triage Vital Signs ED Triage Vitals  Enc Vitals Group     BP 11/01/21 1808 (!) 193/125     Pulse Rate 11/01/21 1808 96     Resp 11/01/21 1808 16     Temp 11/01/21 1808 99 F (37.2 C)     Temp Source 11/01/21 1808 Oral     SpO2 11/01/21 1808 92 %     Weight 11/01/21 1810 225 lb (102.1 kg)     Height 11/01/21 1810 6' (1.829 m)     Head Circumference --      Peak Flow --      Pain Score 11/01/21 1810 7     Pain Loc --      Pain Edu? --      Excl. in GC? --    No data found.  Updated Vital Signs BP (!) 193/125 (BP Location: Left Arm)   Pulse 96   Temp 99 F (37.2 C)  (Oral)   Resp 16   Ht 6' (1.829 m)   Wt 225 lb (102.1 kg)   SpO2 92%   BMI 30.52 kg/m   Visual Acuity Right Eye Distance:   Left Eye Distance:   Bilateral Distance:    Right Eye Near:   Left Eye Near:    Bilateral Near:     Physical Exam Vitals and nursing note reviewed.  Constitutional:      Appearance: Normal appearance. He is not ill-appearing or toxic-appearing.     Comments: Very pleasant patient sitting on exam in position of comfort table in no acute distress.   HENT:     Head: Normocephalic and atraumatic.     Right Ear: Hearing, tympanic membrane, ear canal and external ear normal.     Left Ear: Hearing, tympanic membrane, ear canal and external ear normal.     Nose: Nose normal.     Mouth/Throat:     Lips: Pink.     Mouth: Mucous membranes are moist.  Eyes:     General: Lids are normal. Vision grossly intact. Gaze aligned appropriately.     Extraocular Movements: Extraocular movements intact.     Conjunctiva/sclera: Conjunctivae normal.  Cardiovascular:     Rate and Rhythm: Normal rate and regular rhythm.     Heart sounds: Normal heart sounds, S1 normal and S2 normal.  Pulmonary:     Effort: Pulmonary effort is normal. No respiratory distress.     Breath sounds: Normal breath sounds and air entry.  Abdominal:     General: Bowel sounds are normal.     Palpations: Abdomen is soft.     Tenderness: There is no abdominal tenderness. There is no right CVA tenderness, left CVA tenderness or guarding.  Musculoskeletal:     Cervical back: Normal and neck supple.     Thoracic back: Normal.     Lumbar back: Tenderness present. No swelling. Decreased range of motion.     Comments: Pain to the paraspinal muscles of lumbar spine with palpation.  No radiculopathy elicited.  No sciatic pain.  No bony tenderness of spine.  Patient is having difficulty bending forward to tie his shoes due to his left hip pain.  Range of motion at the  hips with forward flexion and lateral  flexion/extension is decreased due to tenderness.  No bruising or swelling to area of greatest tenderness.   Skin:    General: Skin is warm and dry.     Capillary Refill: Capillary refill takes less than 2 seconds.     Findings: No rash.  Neurological:     General: No focal deficit present.     Mental Status: He is alert and oriented to person, place, and time. Mental status is at baseline.     Cranial Nerves: No dysarthria or facial asymmetry.     Gait: Gait is intact.  Psychiatric:        Mood and Affect: Mood normal.        Speech: Speech normal.        Behavior: Behavior normal.        Thought Content: Thought content normal.        Judgment: Judgment normal.      UC Treatments / Results  Labs (all labs ordered are listed, but only abnormal results are displayed) Labs Reviewed - No data to display  EKG   Radiology DG Hip Unilat With Pelvis 2-3 Views Left  Result Date: 11/01/2021 CLINICAL DATA:  [id number] EXAM: DG HIP (WITH OR WITHOUT PELVIS) 2-3V LEFT COMPARISON:  None Available. FINDINGS: There is no evidence of hip fracture or dislocation. There is no evidence of arthropathy or other focal bone abnormality. IMPRESSION: Negative. Electronically Signed   By: Burman Nieves M.D.   On: 11/01/2021 19:26    Procedures Procedures (including critical care time)  Medications Ordered in UC Medications - No data to display  Initial Impression / Assessment and Plan / UC Course  I have reviewed the triage vital signs and the nursing notes.  Pertinent labs & imaging results that were available during my care of the patient were reviewed by me and considered in my medical decision making (see chart for details).  1.  Left hip pain Pelvic x-ray negative for acute bony abnormality and hip fracture.    Final Clinical Impressions(s) / UC Diagnoses   Final diagnoses:  Left hip pain  Strain of lumbar region, initial encounter     Discharge Instructions      Your hip x-ray  was negative for fracture today.   Take ibuprofen 800 mg every 8 hours for pain and inflammation.  Apply heat to area of greatest tenderness to reduce muscle spasm.    Take Tizanidine muscle relaxer by mouth every 6 hours as needed for muscle spasms.  Do not take this medication and drive or go to work or drink alcohol as it can make you very sleepy.  Follow-up with [facility] as needed if your symptoms are not improving in the next 1 to 2 weeks.   Stop taking lisinopril and start taking amlodipine 5 mg every single day.  It is very important that you lower your blood pressure to prevent kidney damage and eye damage.  High blood pressure for long periods of time can lead to heart attack and stroke.   I have initiated PCP assistance for you today.  You may also go to [email or web address] to schedule a primary care appointment.  Work note is at the end of this packet.  If you develop any new or worsening symptoms or do not improve in the next 2 to 3 days, please return.  If your symptoms are severe, please go to the emergency room.  Follow-up with your primary care provider for  further evaluation and management of your symptoms as well as ongoing wellness visits.  I hope you feel better!     ED Prescriptions     Medication Sig Dispense Auth. Provider   amLODipine (NORVASC) 5 MG tablet Take 1 tablet (5 mg total) by mouth daily. 30 tablet Reita May M, FNP   ibuprofen (ADVIL) 800 MG tablet Take 1 tablet (800 mg total) by mouth 3 (three) times daily. 21 tablet Carlisle Beers, FNP   tiZANidine (ZANAFLEX) 4 MG tablet Take 1 tablet (4 mg total) by mouth every 6 (six) hours as needed for muscle spasms. 30 tablet Carlisle Beers, FNP      PDMP not reviewed this encounter.

## 2021-11-01 NOTE — ED Triage Notes (Signed)
Patient fell last Monday an has been having left hip and back pain since.  Patient last took ibuprofen for pain 30 minutes ago.

## 2021-11-03 DIAGNOSIS — I10 Essential (primary) hypertension: Secondary | ICD-10-CM

## 2022-01-03 ENCOUNTER — Ambulatory Visit (HOSPITAL_COMMUNITY)
Admission: EM | Admit: 2022-01-03 | Discharge: 2022-01-03 | Disposition: A | Discharge status: Home / Self Care | Payer: Commercial Managed Care - HMO | Attending: Family Medicine | Admitting: Family Medicine

## 2022-01-03 ENCOUNTER — Encounter (HOSPITAL_COMMUNITY): Payer: Self-pay | Admitting: *Deleted

## 2022-01-03 DIAGNOSIS — M25552 Pain in left hip: Secondary | ICD-10-CM | POA: Insufficient documentation

## 2022-01-03 DIAGNOSIS — R531 Weakness: Secondary | ICD-10-CM | POA: Diagnosis present

## 2022-01-03 DIAGNOSIS — R0602 Shortness of breath: Secondary | ICD-10-CM | POA: Diagnosis present

## 2022-01-03 DIAGNOSIS — R5383 Other fatigue: Secondary | ICD-10-CM | POA: Diagnosis present

## 2022-01-03 DIAGNOSIS — R197 Diarrhea, unspecified: Secondary | ICD-10-CM | POA: Insufficient documentation

## 2022-01-03 LAB — CBC WITH DIFFERENTIAL/PLATELET
Abs Immature Granulocytes: 0.03 10*3/uL (ref 0.00–0.07)
Basophils Absolute: 0 10*3/uL (ref 0.0–0.1)
Basophils Relative: 0 %
Eosinophils Absolute: 0 10*3/uL (ref 0.0–0.5)
Eosinophils Relative: 0 %
HCT: 41.4 % (ref 39.0–52.0)
Hemoglobin: 14.2 g/dL (ref 13.0–17.0)
Immature Granulocytes: 0 %
Lymphocytes Relative: 4 %
Lymphs Abs: 0.4 10*3/uL — ABNORMAL LOW (ref 0.7–4.0)
MCH: 30.9 pg (ref 26.0–34.0)
MCHC: 34.3 g/dL (ref 30.0–36.0)
MCV: 90.2 fL (ref 80.0–100.0)
Monocytes Absolute: 0.4 10*3/uL (ref 0.1–1.0)
Monocytes Relative: 4 %
Neutro Abs: 10 10*3/uL — ABNORMAL HIGH (ref 1.7–7.7)
Neutrophils Relative %: 92 %
Platelets: 221 10*3/uL (ref 150–400)
RBC: 4.59 MIL/uL (ref 4.22–5.81)
RDW: 13 % (ref 11.5–15.5)
WBC: 10.9 10*3/uL — ABNORMAL HIGH (ref 4.0–10.5)
nRBC: 0 % (ref 0.0–0.2)

## 2022-01-03 LAB — COMPREHENSIVE METABOLIC PANEL
ALT: 16 U/L (ref 0–44)
AST: 20 U/L (ref 15–41)
Albumin: 3.5 g/dL (ref 3.5–5.0)
Alkaline Phosphatase: 67 U/L (ref 38–126)
Anion gap: 7 (ref 5–15)
BUN: 12 mg/dL (ref 6–20)
CO2: 24 mmol/L (ref 22–32)
Calcium: 8.6 mg/dL — ABNORMAL LOW (ref 8.9–10.3)
Chloride: 106 mmol/L (ref 98–111)
Creatinine, Ser: 1.36 mg/dL — ABNORMAL HIGH (ref 0.61–1.24)
GFR, Estimated: >60 mL/min (ref >60)
Glucose, Bld: 96 mg/dL (ref 70–99)
Potassium: 3.5 mmol/L (ref 3.5–5.1)
Sodium: 137 mmol/L (ref 135–145)
Total Bilirubin: 0.4 mg/dL (ref 0.3–1.2)
Total Protein: 6.8 g/dL (ref 6.5–8.1)

## 2022-01-03 NOTE — Discharge Instructions (Addendum)
You were seen today primarily for muscle fatigue and muscle spasms, as well as diarrhea and shortness of breath.  Overall your exam is okay.  I have ordered blood work to help figure out the cause of your symptoms.  This will be resulted tomorrow.  We will call you with results and discuss next steps.  If you having worsening symptoms, then you need to go to the ER for further evaluation and testing.  Please get plenty of rest and fluids over the next several days.  You may take tylenol for pain and body aches at this time.  Avoid NSAIDS as this may cause upset stomach.  I have given you off of work as well until we have a better idea what is going on.

## 2022-01-03 NOTE — ED Triage Notes (Signed)
Patient states that he is having fatigue and muscle pain for a couple weeks. He did go for a cortisone injection for some of the pain but he feels it didn't help. He had some Zanaflex at home he has been taking but states that it was helping but not anymore.   Started having diarrhea last night.

## 2022-01-03 NOTE — ED Provider Notes (Signed)
[facility]    CSN: [id number] Arrival date & time: 01/03/22  1416      History   Chief Complaint Chief Complaint  Patient presents with   Fatigue    HPI Jonathan [name] is a 46 y.o. male.   Patient is here for fatigue, and muscle cramps.  In his legs, arms, hand, etc.   This started about 2 weeks, but hurting more now than previous.  At times his hands feel numb, like he is loosing function in his hands.  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  He just feels very tired.  He did have an apt with his pcp today, but they said they don't take his insurance.  He also started with diarrhea last night.  He felt a bit nauseated this morning.  No vomiting.  He has been eating and drinking okay, but now he has diarrhea when he eats/drinking anything.  He takes norvasc for htn.  Found an old script of tizanidine and started taking that for muscle cramping.  Started norvasc in June 2023.  He was seen in June for hip pain.  He did get a steroid injection into the hip.  He is having pain there currently.   Past Medical History:  Diagnosis Date   Hypertension    Lower back pain     Patient Active Problem List   Diagnosis Date Noted   Essential hypertension 11/03/2021   Chest pain 12/21/2011   Tobacco abuse 12/21/2011   Elevated blood pressure 12/21/2011   Alcoholism (HCC) 12/21/2011   VERTIGO NOS OR DIZZINESS 07/13/2006    History reviewed. No pertinent surgical history.     Home Medications    Prior to Admission medications   Medication Sig Start Date End Date Taking? Authorizing Provider  amLODipine (NORVASC) 5 MG tablet Take 1 tablet (5 mg total) by mouth daily. 11/01/21  Yes Carlisle Beers, FNP  tiZANidine (ZANAFLEX) 4 MG tablet Take 1 tablet (4 mg total) by mouth every 6 (six) hours as needed for muscle spasms. 11/01/21  Yes Carlisle Beers, FNP  aspirin 325 MG tablet Take 325 mg by mouth daily.     [provider]  ibuprofen (ADVIL) 800 MG tablet Take 1 tablet (800 mg total) by mouth 3 (three) times daily. 11/01/21   Carlisle Beers, FNP  traZODone (DESYREL) 50 MG tablet Take 0.5-2 tablets (25-100 mg total) by mouth at bedtime. 06/21/21   Zenia Resides, MD    Family History Family History  Problem Relation Age of Onset   Cancer Mother    Hypertension Other     Social History Social History   Tobacco Use   Smoking status: Every Day    Packs/day: 1.00    Types: Cigarettes   Smokeless tobacco: Never  Vaping Use   Vaping Use: Never used  Substance Use Topics   Alcohol use: Yes   Drug use: Yes    Types: Marijuana     Allergies   Patient has no known allergies.   Review of Systems Review of Systems  Constitutional:  Positive for activity change, appetite change and fatigue. Negative for chills and fever.  HENT: Negative.    Respiratory:  Positive for shortness of breath.   Cardiovascular: Negative.   Gastrointestinal:  Positive for diarrhea and nausea. Negative for vomiting.  Genitourinary: Negative.   Musculoskeletal:  Positive for arthralgias and myalgias.  Skin: Negative.   Neurological:  Positive for weakness.  Psychiatric/Behavioral: Negative.       Physical Exam Triage Vital Signs ED Triage Vitals  Enc Vitals Group     BP 01/03/22 1444 (!) 153/93     Pulse Rate 01/03/22 1444 (!) 103     Resp 01/03/22 1444 18     Temp 01/03/22 1444 99.5 F (37.5 C)     Temp Source 01/03/22 1444 Oral     SpO2 01/03/22 1444 97 %     Weight --      Height --      Head Circumference --      Peak Flow --      Pain Score 01/03/22 1442 8     Pain Loc --      Pain Edu? --      Excl. in GC? --    No data found.  Updated Vital Signs BP (!) 153/93 (BP Location: Right Arm)   Pulse (!) 103   Temp 99.5 F (37.5 C) (Oral)   Resp 18   SpO2 97%   Visual Acuity Right Eye Distance:   Left Eye Distance:   Bilateral Distance:    Right Eye Near:    Left Eye Near:    Bilateral Near:     Physical Exam Constitutional:      General: He is not in acute distress.    Appearance: Normal appearance. He is ill-appearing.  HENT:     Head: Normocephalic.     Right Ear: Tympanic membrane normal.  Cardiovascular:     Rate and Rhythm: Normal rate and regular rhythm.  Pulmonary:     Effort: Pulmonary effort is normal. No respiratory distress.     Breath sounds: Normal breath sounds. No wheezing.  Abdominal:     General: Bowel sounds are normal.     Palpations: Abdomen is soft.     Tenderness: There is no abdominal tenderness.  Musculoskeletal:        General: No tenderness, deformity or signs of injury. Normal range of motion.     Cervical back: Normal range of motion and neck supple.  Lymphadenopathy:     Comments: Areas of fullness under the axilla bilaterally, but no obvious enlargement of lymph nodes  Skin:    General: Skin is warm and dry.  Neurological:     General: No focal deficit present.     Mental Status: He is alert and oriented to person, place, and time.     Motor: No weakness.     Coordination: Coordination normal.  Psychiatric:        Mood and Affect: Mood normal.        Behavior: Behavior normal.      UC Treatments / Results  Labs (all labs ordered are listed, but only abnormal results are displayed) Labs Reviewed  CBC WITH DIFFERENTIAL/PLATELET  COMPREHENSIVE METABOLIC PANEL    EKG   Radiology No results found.  Procedures Procedures (including critical care time)  Medications Ordered in UC Medications - No data to display  Initial Impression / Assessment and Plan / UC Course  I have reviewed the triage vital signs and the nursing notes.  Pertinent labs & imaging results that were available during my care of the patient were reviewed by me and considered in my medical decision making (see chart for details).   Final Clinical Impressions(s) / UC Diagnoses   Final diagnoses:  Other fatigue   Weakness  Diarrhea, unspecified type  SOB (shortness of breath)  Pain of  left hip     Discharge Instructions      You were seen today primarily for muscle fatigue and muscle spasms, as well as diarrhea and shortness of breath.  Overall your exam is okay.  I have ordered blood work to help figure out the cause of your symptoms.  This will be resulted tomorrow.  We will call you with results and discuss next steps.  If you having worsening symptoms, then you need to go to the ER for further evaluation and testing.  Please get plenty of rest and fluids over the next several days.  You may take tylenol for pain and body aches at this time.  Avoid NSAIDS as this may cause upset stomach.  I have given you off of work as well until we have a better idea what is going on.     ED Prescriptions   None    PDMP not reviewed this encounter.   Jannifer Franklin, MD 01/03/22 [phone]

## 2022-04-24 ENCOUNTER — Telehealth: Payer: Commercial Managed Care - HMO | Admitting: Physician Assistant

## 2022-04-24 DIAGNOSIS — K047 Periapical abscess without sinus: Secondary | ICD-10-CM

## 2022-04-24 MED ORDER — AMOXICILLIN 500 MG PO CAPS: 500.0000 mg | ORAL_CAPSULE | Freq: Three times a day (TID) | ORAL | 0 refills | Status: AC

## 2022-04-24 MED ORDER — IBUPROFEN 800 MG PO TABS: 800.0000 mg | ORAL_TABLET | Freq: Three times a day (TID) | ORAL | 0 refills | Status: DC | PRN

## 2022-04-24 NOTE — Progress Notes (Signed)
Virtual Visit Consent   Jonathan [name], you are scheduled for a virtual visit with a [facility] provider today. Just as with appointments in the office, your consent must be obtained to participate. Your consent will be active for this visit and any virtual visit you may have with one of our providers in the next 365 days. If you have a MyChart account, a copy of this consent can be sent to you electronically.  As this is a virtual visit, video technology does not allow for your provider to perform a traditional examination. This may limit your provider's ability to fully assess your condition. If your provider identifies any concerns that need to be evaluated in person or the need to arrange testing (such as labs, EKG, etc.), we will make arrangements to do so. Although advances in technology are sophisticated, we cannot ensure that it will always work on either your end or our end. If the connection with a video visit is poor, the visit may have to be switched to a telephone visit. With either a video or telephone visit, we are not always able to ensure that we have a secure connection.  By engaging in this virtual visit, you consent to the provision of healthcare and authorize for your insurance to be billed (if applicable) for the services provided during this visit. Depending on your insurance coverage, you may receive a charge related to this service.  I need to obtain your verbal consent now. Are you willing to proceed with your visit today? Jonathan [name] has provided verbal consent on 04/24/2022 for a virtual visit (video or telephone). Margaretann Loveless, PA-C  Date: 04/24/2022 4:42 PM  Virtual Visit via Video Note   I, Margaretann Loveless, connected with  Jonathan [name]  ([id number], 09-14-1975) on 04/24/22 at  4:30 PM EST by a video-enabled telemedicine application and verified that I am speaking with the correct person using two identifiers.  Location: Patient:  Virtual Visit Location Patient: Home Provider: Virtual Visit Location Provider: Home Office   I discussed the limitations of evaluation and management by telemedicine and the availability of in person appointments. The patient expressed understanding and agreed to proceed.    History of Present Illness: Jonathan [name] is a 46 y.o. who identifies as a male who was assigned male at birth, and is being seen today for dental pain.  HPI: Dental Pain  This is a new problem. The current episode started yesterday (broke tooth last week then worsened last night). The problem occurs constantly. The problem has been gradually worsening. The pain is severe. Associated symptoms include facial pain (radiates to ear pain), a fever, sinus pressure and thermal sensitivity. Pertinent negatives include no difficulty swallowing. He has tried acetaminophen and NSAIDs for the symptoms. The treatment provided no relief.     Problems:  Patient Active Problem List   Diagnosis Date Noted   Essential hypertension 11/03/2021   Chest pain 12/21/2011   Tobacco abuse 12/21/2011   Elevated blood pressure 12/21/2011   Alcoholism (HCC) 12/21/2011   VERTIGO NOS OR DIZZINESS 07/13/2006    Allergies: No Known Allergies Medications:  Current Outpatient Medications:    amoxicillin (AMOXIL) 500 MG capsule, Take 1 capsule (500 mg total) by mouth 3 (three) times daily for 10 days., Disp: 30 capsule, Rfl: 0   ibuprofen (ADVIL) 800 MG tablet, Take 1 tablet (800 mg total) by mouth every 8 (eight) hours as needed., Disp: 30 tablet, Rfl: 0   amLODipine (  NORVASC) 5 MG tablet, Take 1 tablet (5 mg total) by mouth daily., Disp: 30 tablet, Rfl: 3   aspirin 325 MG tablet, Take 325 mg by mouth daily., Disp: , Rfl:    tiZANidine (ZANAFLEX) 4 MG tablet, Take 1 tablet (4 mg total) by mouth every 6 (six) hours as needed for muscle spasms., Disp: 30 tablet, Rfl: 0   traZODone (DESYREL) 50 MG tablet, Take 0.5-2 tablets (25-100 mg total) by  mouth at bedtime., Disp: 20 tablet, Rfl: 0  Observations/Objective: Patient is well-developed, well-nourished in no acute distress.  Resting comfortably at home.  Head is normocephalic, atraumatic.  No labored breathing.  Speech is clear and coherent with logical content.  Patient is alert and oriented at baseline.    Assessment and Plan: 1. Dental infection - amoxicillin (AMOXIL) 500 MG capsule; Take 1 capsule (500 mg total) by mouth 3 (three) times daily for 10 days.  Dispense: 30 capsule; Refill: 0 - ibuprofen (ADVIL) 800 MG tablet; Take 1 tablet (800 mg total) by mouth every 8 (eight) hours as needed.  Dispense: 30 tablet; Refill: 0  - Suspected infection with broken tooth - Amoxicillin and ibuprofen prescribed - Can use ice on outside jaw/cheek for swelling - Can also take tylenol for pain with other medications - Discussed DenTemp putty that can be used to cover a broken tooth - Schedule a follow with a dentist as soon as possible (Can contact [facility] or [facility] [[phone]] associated with [facility] health department if underinsured or uninsured) - Seek in person evaluation if symptoms fail to improve or if they worsen   Follow Up Instructions: I discussed the assessment and treatment plan with the patient. The patient was provided an opportunity to ask questions and all were answered. The patient agreed with the plan and demonstrated an understanding of the instructions.  A copy of instructions were sent to the patient via MyChart unless otherwise noted below.    The patient was advised to call back or seek an in-person evaluation if the symptoms worsen or if the condition fails to improve as anticipated.  Time:  I spent 8 minutes with the patient via telehealth technology discussing the above problems/concerns.    Margaretann Loveless, PA-C

## 2022-04-24 NOTE — Patient Instructions (Signed)
Hedy Camara, thank you for joining Margaretann Loveless, PA-C for today's virtual visit.  While this provider is not your primary care provider (PCP), if your PCP is located in our provider database this encounter information will be shared with them immediately following your visit.   A [facility] MyChart account gives you access to today's visit and all your visits, tests, and labs performed at [facility] " click here if you don't have a [facility] MyChart account or go to [email or web address]  Consent: (Patient) Jonathan [name] provided verbal consent for this virtual visit at the beginning of the encounter.  Current Medications:  Current Outpatient Medications:    amoxicillin (AMOXIL) 500 MG capsule, Take 1 capsule (500 mg total) by mouth 3 (three) times daily for 10 days., Disp: 30 capsule, Rfl: 0   ibuprofen (ADVIL) 800 MG tablet, Take 1 tablet (800 mg total) by mouth every 8 (eight) hours as needed., Disp: 30 tablet, Rfl: 0   amLODipine (NORVASC) 5 MG tablet, Take 1 tablet (5 mg total) by mouth daily., Disp: 30 tablet, Rfl: 3   aspirin 325 MG tablet, Take 325 mg by mouth daily., Disp: , Rfl:    tiZANidine (ZANAFLEX) 4 MG tablet, Take 1 tablet (4 mg total) by mouth every 6 (six) hours as needed for muscle spasms., Disp: 30 tablet, Rfl: 0   traZODone (DESYREL) 50 MG tablet, Take 0.5-2 tablets (25-100 mg total) by mouth at bedtime., Disp: 20 tablet, Rfl: 0   Medications ordered in this encounter:  Meds ordered this encounter  Medications   amoxicillin (AMOXIL) 500 MG capsule    Sig: Take 1 capsule (500 mg total) by mouth 3 (three) times daily for 10 days.    Dispense:  30 capsule    Refill:  0    Order Specific Question:   Supervising Provider    Answer:   Merrilee Jansky [id number]   ibuprofen (ADVIL) 800 MG tablet    Sig: Take 1 tablet (800 mg total) by mouth every 8 (eight) hours as needed.    Dispense:  30 tablet    Refill:  0    Order  Specific Question:   Supervising Provider    Answer:   Merrilee Jansky [id number]     *If you need refills on other medications prior to your next appointment, please contact your pharmacy*  Follow-Up: Call back or seek an in-person evaluation if the symptoms worsen or if the condition fails to improve as anticipated.  [facility] [phone]  Other Instructions  [facility] at [address] [place], [place], [place] [address].For appointments and more information, call [phone].   Dental Abscess  A dental abscess is an area of pus in or around a tooth. It comes from an infection. It can cause pain and other symptoms. Treatment will help with symptoms and prevent the infection from spreading. What are the causes? This condition is caused by an infection in or around the tooth. This can be from: Very bad tooth decay (cavities). A bad injury to the tooth, such as a broken or chipped tooth. What increases the risk? The risk to get an abscess is higher in males. It is also more likely in people who: Have dental decay. Have very bad gum disease. Eat sugary snacks between meals. Use tobacco. Have diabetes. Have a weak disease-fighting system (immune system). Do not brush their teeth regularly. What are the signs or symptoms? Some mild symptoms are: Tenderness. Bad  breath. Fever. A sharp, sour taste in the mouth. Pain in and around the infected tooth. Worse symptoms of this condition include: Swollen neck glands. Chills. Pus draining around the tooth. Swelling and redness around the tooth, the mouth, or the face. Very bad pain in and around the tooth. The worst symptoms can include: Difficulty swallowing. Difficulty opening your mouth. Feeling like you may vomit or vomiting. How is this treated? This is treated by getting rid of the infection. Your dentist will discuss ways to do this, including: Antibiotic medicines. Antibacterial mouth  rinse. An incision in the abscess to drain out the pus. A root canal. Removing the tooth. Follow these instructions at home: Medicines Take over-the-counter and prescription medicines only as told by your dentist. If you were prescribed an antibiotic medicine, take it as told by your dentist. Do not stop taking it even if you start to feel better. If you were prescribed a gel that has numbing medicine in it, use it exactly as told. Ask your dentist if you should avoid driving or using machines while you are taking your medicine. General instructions Rinse your mouth often with salt water. To make salt water, dissolve -1 tsp (3-6 g) of salt in 1 cup (237 mL) of warm water. Eat a soft diet while your mouth is healing. Drink enough fluid to keep your pee (urine) pale yellow. Do not apply heat to the outside of your mouth. Do not smoke or use any products that contain nicotine or tobacco. If you need help quitting, ask your dentist. Keep all follow-up visits. Prevent an abscess Brush your teeth every morning and every night. Use fluoride toothpaste. Floss your teeth each day. Get dental cleanings as often as told by your dentist. Think about getting dental sealant put on teeth that have deep holes (decay). Drink water that has fluoride in it. Most tap water has fluoride. Check the label on bottled water to see if it has fluoride in it. Drink water instead of sugary drinks. Eat healthy meals and snacks. Wear a mouth guard or face shield when you play sports. Contact a doctor if: Your pain is worse and medicine does not help. Get help right away if: You have a fever or chills. Your symptoms suddenly get worse. You have a very bad headache. You have problems breathing or swallowing. You have trouble opening your mouth. You have swelling in your neck or close to your eye. These symptoms may be an emergency. Get help right away. Call your local emergency services (911 in the [place] Do  not wait to see if the symptoms will go away. Do not drive yourself to the hospital. Summary A dental abscess is an area of pus in or around a tooth. It is caused by an infection. Treatment will help with symptoms and prevent the infection from spreading. Take over-the-counter and prescription medicines only as told by your dentist. To prevent an abscess, take good care of your teeth. Brush your teeth every morning and night. Use floss every day. Get dental cleanings as often as told by your dentist. This information is not intended to replace advice given to you by your health care provider. Make sure you discuss any questions you have with your health care provider. Document Revised: 07/09/2020 Document Reviewed: 07/09/2020 Elsevier Patient Education  2023 Elsevier Inc.    If you have been instructed to have an in-person evaluation today at a local Urgent Care facility, please use the link below. It will take you  to a list of all of our available [facility], including address, phone number and hours of operation. Please do not delay care.  [facility]  If you or a family member do not have a primary care provider, use the link below to schedule a visit and establish care. When you choose a [facility] primary care physician or advanced practice provider, you gain a long-term partner in health. Find a Primary Care Provider  Learn more about [facility] in-office and virtual care options: [facility]

## 2022-06-30 ENCOUNTER — Encounter (HOSPITAL_COMMUNITY): Payer: Self-pay

## 2022-06-30 ENCOUNTER — Other Ambulatory Visit: Payer: Self-pay

## 2022-06-30 ENCOUNTER — Emergency Department (HOSPITAL_COMMUNITY): Payer: Medicaid Other

## 2022-06-30 ENCOUNTER — Inpatient Hospital Stay (HOSPITAL_COMMUNITY): Payer: Medicaid Other

## 2022-06-30 ENCOUNTER — Inpatient Hospital Stay (HOSPITAL_COMMUNITY)
Admission: EM | Admit: 2022-06-30 | Discharge: 2022-07-04 | DRG: 917 | Disposition: A | Discharge status: Rehabilitation Facility | Payer: Medicaid Other | Attending: Internal Medicine | Admitting: Internal Medicine

## 2022-06-30 DIAGNOSIS — Y92009 Unspecified place in unspecified non-institutional (private) residence as the place of occurrence of the external cause: Secondary | ICD-10-CM | POA: Diagnosis not present

## 2022-06-30 DIAGNOSIS — I69398 Other sequelae of cerebral infarction: Secondary | ICD-10-CM | POA: Diagnosis not present

## 2022-06-30 DIAGNOSIS — R29703 NIHSS score 3: Secondary | ICD-10-CM | POA: Diagnosis present

## 2022-06-30 DIAGNOSIS — Z716 Tobacco abuse counseling: Secondary | ICD-10-CM

## 2022-06-30 DIAGNOSIS — Z6835 Body mass index (BMI) 35.0-35.9, adult: Secondary | ICD-10-CM | POA: Diagnosis not present

## 2022-06-30 DIAGNOSIS — I6381 Other cerebral infarction due to occlusion or stenosis of small artery: Secondary | ICD-10-CM | POA: Diagnosis present

## 2022-06-30 DIAGNOSIS — R202 Paresthesia of skin: Secondary | ICD-10-CM

## 2022-06-30 DIAGNOSIS — G8194 Hemiplegia, unspecified affecting left nondominant side: Secondary | ICD-10-CM | POA: Diagnosis present

## 2022-06-30 DIAGNOSIS — F17210 Nicotine dependence, cigarettes, uncomplicated: Secondary | ICD-10-CM | POA: Diagnosis present

## 2022-06-30 DIAGNOSIS — E669 Obesity, unspecified: Secondary | ICD-10-CM | POA: Diagnosis present

## 2022-06-30 DIAGNOSIS — F1410 Cocaine abuse, uncomplicated: Secondary | ICD-10-CM | POA: Diagnosis present

## 2022-06-30 DIAGNOSIS — I10 Essential (primary) hypertension: Secondary | ICD-10-CM | POA: Diagnosis present

## 2022-06-30 DIAGNOSIS — Z720 Tobacco use: Secondary | ICD-10-CM

## 2022-06-30 DIAGNOSIS — Z7151 Drug abuse counseling and surveillance of drug abuser: Secondary | ICD-10-CM | POA: Diagnosis not present

## 2022-06-30 DIAGNOSIS — Z79899 Other long term (current) drug therapy: Secondary | ICD-10-CM | POA: Diagnosis not present

## 2022-06-30 DIAGNOSIS — I6389 Other cerebral infarction: Secondary | ICD-10-CM | POA: Diagnosis not present

## 2022-06-30 DIAGNOSIS — R531 Weakness: Secondary | ICD-10-CM | POA: Diagnosis not present

## 2022-06-30 DIAGNOSIS — T405X1A Poisoning by cocaine, accidental (unintentional), initial encounter: Secondary | ICD-10-CM | POA: Diagnosis present

## 2022-06-30 DIAGNOSIS — Z8249 Family history of ischemic heart disease and other diseases of the circulatory system: Secondary | ICD-10-CM

## 2022-06-30 DIAGNOSIS — I639 Cerebral infarction, unspecified: Secondary | ICD-10-CM | POA: Diagnosis not present

## 2022-06-30 DIAGNOSIS — E785 Hyperlipidemia, unspecified: Secondary | ICD-10-CM | POA: Diagnosis present

## 2022-06-30 DIAGNOSIS — G4733 Obstructive sleep apnea (adult) (pediatric): Secondary | ICD-10-CM | POA: Diagnosis present

## 2022-06-30 DIAGNOSIS — Z91148 Patient's other noncompliance with medication regimen for other reason: Secondary | ICD-10-CM | POA: Diagnosis not present

## 2022-06-30 DIAGNOSIS — Z809 Family history of malignant neoplasm, unspecified: Secondary | ICD-10-CM | POA: Diagnosis not present

## 2022-06-30 LAB — CBC
HCT: 43.2 % (ref 39.0–52.0)
HCT: 42.4 % (ref 39.0–52.0)
Hemoglobin: 14.7 g/dL (ref 13.0–17.0)
Hemoglobin: 14.5 g/dL (ref 13.0–17.0)
MCH: 30.5 pg (ref 26.0–34.0)
MCH: 30.4 pg (ref 26.0–34.0)
MCHC: 34 g/dL (ref 30.0–36.0)
MCHC: 34.2 g/dL (ref 30.0–36.0)
MCV: 89.6 fL (ref 80.0–100.0)
MCV: 88.9 fL (ref 80.0–100.0)
Platelets: 270 10*3/uL (ref 150–400)
Platelets: 262 10*3/uL (ref 150–400)
RBC: 4.82 MIL/uL (ref 4.22–5.81)
RBC: 4.77 MIL/uL (ref 4.22–5.81)
RDW: 12.6 % (ref 11.5–15.5)
RDW: 12.6 % (ref 11.5–15.5)
WBC: 6.4 10*3/uL (ref 4.0–10.5)
WBC: 6.9 10*3/uL (ref 4.0–10.5)
nRBC: 0 % (ref 0.0–0.2)
nRBC: 0 % (ref 0.0–0.2)

## 2022-06-30 LAB — I-STAT CHEM 8, ED
BUN: 11 mg/dL (ref 6–20)
Calcium, Ion: 1.22 mmol/L (ref 1.15–1.40)
Chloride: 106 mmol/L (ref 98–111)
Creatinine, Ser: 1.3 mg/dL — ABNORMAL HIGH (ref 0.61–1.24)
Glucose, Bld: 125 mg/dL — ABNORMAL HIGH (ref 70–99)
HCT: 42 % (ref 39.0–52.0)
Hemoglobin: 14.3 g/dL (ref 13.0–17.0)
Potassium: 3.7 mmol/L (ref 3.5–5.1)
Sodium: 142 mmol/L (ref 135–145)
TCO2: 25 mmol/L (ref 22–32)

## 2022-06-30 LAB — DIFFERENTIAL
Abs Immature Granulocytes: 0.01 10*3/uL (ref 0.00–0.07)
Basophils Absolute: 0 10*3/uL (ref 0.0–0.1)
Basophils Relative: 0 %
Eosinophils Absolute: 0.1 10*3/uL (ref 0.0–0.5)
Eosinophils Relative: 1 %
Immature Granulocytes: 0 %
Lymphocytes Relative: 25 %
Lymphs Abs: 1.6 10*3/uL (ref 0.7–4.0)
Monocytes Absolute: 0.4 10*3/uL (ref 0.1–1.0)
Monocytes Relative: 6 %
Neutro Abs: 4.3 10*3/uL (ref 1.7–7.7)
Neutrophils Relative %: 68 %

## 2022-06-30 LAB — COMPREHENSIVE METABOLIC PANEL
ALT: 19 U/L (ref 0–44)
AST: 21 U/L (ref 15–41)
Albumin: 4.2 g/dL (ref 3.5–5.0)
Alkaline Phosphatase: 66 U/L (ref 38–126)
Anion gap: 12 (ref 5–15)
BUN: 12 mg/dL (ref 6–20)
CO2: 22 mmol/L (ref 22–32)
Calcium: 9.1 mg/dL (ref 8.9–10.3)
Chloride: 107 mmol/L (ref 98–111)
Creatinine, Ser: 1.37 mg/dL — ABNORMAL HIGH (ref 0.61–1.24)
GFR, Estimated: >60 mL/min (ref >60)
Glucose, Bld: 125 mg/dL — ABNORMAL HIGH (ref 70–99)
Potassium: 3.6 mmol/L (ref 3.5–5.1)
Sodium: 141 mmol/L (ref 135–145)
Total Bilirubin: 0.6 mg/dL (ref 0.3–1.2)
Total Protein: 8 g/dL (ref 6.5–8.1)

## 2022-06-30 LAB — HIV ANTIBODY (ROUTINE TESTING W REFLEX): HIV Screen 4th Generation wRfx: NONREACTIVE

## 2022-06-30 LAB — URINALYSIS, ROUTINE W REFLEX MICROSCOPIC
Bilirubin Urine: NEGATIVE
Glucose, UA: NEGATIVE mg/dL
Hgb urine dipstick: NEGATIVE
Ketones, ur: NEGATIVE mg/dL
Leukocytes,Ua: NEGATIVE
Nitrite: NEGATIVE
Protein, ur: NEGATIVE mg/dL
Specific Gravity, Urine: 1.006 (ref 1.005–1.030)
pH: 6 (ref 5.0–8.0)

## 2022-06-30 LAB — PROTIME-INR
INR: 1 (ref 0.8–1.2)
Prothrombin Time: 12.7 seconds (ref 11.4–15.2)

## 2022-06-30 LAB — RAPID URINE DRUG SCREEN, HOSP PERFORMED
Amphetamines: NOT DETECTED
Barbiturates: NOT DETECTED
Benzodiazepines: NOT DETECTED
Cocaine: POSITIVE — AB
Opiates: NOT DETECTED
Tetrahydrocannabinol: NOT DETECTED

## 2022-06-30 LAB — APTT: aPTT: 29 seconds (ref 24–36)

## 2022-06-30 LAB — CBG MONITORING, ED: Glucose-Capillary: 132 mg/dL — ABNORMAL HIGH (ref 70–99)

## 2022-06-30 LAB — CREATININE, SERUM
Creatinine, Ser: 1.29 mg/dL — ABNORMAL HIGH (ref 0.61–1.24)
GFR, Estimated: >60 mL/min (ref >60)

## 2022-06-30 LAB — ETHANOL: Alcohol, Ethyl (B): <10 mg/dL (ref <10)

## 2022-06-30 LAB — HEMOGLOBIN A1C
Hgb A1c MFr Bld: 5.9 % — ABNORMAL HIGH (ref 4.8–5.6)
Mean Plasma Glucose: 122.63 mg/dL

## 2022-06-30 MED ORDER — ENOXAPARIN SODIUM 40 MG/0.4ML IJ SOSY
40.0000 mg | PREFILLED_SYRINGE | INTRAMUSCULAR | Status: DC
  Administered 2022-06-30 – 2022-07-03 (×4): 40 mg via SUBCUTANEOUS
  Filled 2022-06-30 (×4): qty 0.4

## 2022-06-30 MED ORDER — SENNOSIDES-DOCUSATE SODIUM 8.6-50 MG PO TABS: 1.0000 | ORAL_TABLET | Freq: Every evening | ORAL | Status: DC | PRN

## 2022-06-30 MED ORDER — STROKE: EARLY STAGES OF RECOVERY BOOK
Freq: Once | Status: AC
  Filled 2022-06-30: qty 1

## 2022-06-30 MED ORDER — ATORVASTATIN CALCIUM 10 MG PO TABS
20.0000 mg | ORAL_TABLET | Freq: Every day | ORAL | Status: DC
  Administered 2022-06-30 – 2022-07-04 (×5): 20 mg via ORAL
  Filled 2022-06-30 (×5): qty 2

## 2022-06-30 MED ORDER — ACETAMINOPHEN 650 MG RE SUPP: 650.0000 mg | RECTAL | Status: DC | PRN

## 2022-06-30 MED ORDER — ACETAMINOPHEN 325 MG PO TABS
650.0000 mg | ORAL_TABLET | ORAL | Status: DC | PRN
  Filled 2022-06-30: qty 2

## 2022-06-30 MED ORDER — CLOPIDOGREL BISULFATE 75 MG PO TABS
75.0000 mg | ORAL_TABLET | Freq: Every day | ORAL | Status: DC
  Administered 2022-07-01 – 2022-07-04 (×4): 75 mg via ORAL
  Filled 2022-06-30 (×5): qty 1

## 2022-06-30 MED ORDER — ACETAMINOPHEN 160 MG/5ML PO SOLN: 650.0000 mg | ORAL | Status: DC | PRN

## 2022-06-30 MED ORDER — CLOPIDOGREL BISULFATE 75 MG PO TABS
75.0000 mg | ORAL_TABLET | Freq: Once | ORAL | Status: AC
  Administered 2022-06-30: 75 mg via ORAL
  Filled 2022-06-30: qty 1

## 2022-06-30 MED ORDER — ACETAMINOPHEN 325 MG PO TABS: 650.0000 mg | ORAL_TABLET | Freq: Four times a day (QID) | ORAL | Status: DC | PRN

## 2022-06-30 MED ORDER — ASPIRIN 325 MG PO TABS
325.0000 mg | ORAL_TABLET | Freq: Every day | ORAL | Status: DC
  Administered 2022-06-30: 325 mg via ORAL
  Filled 2022-06-30: qty 1

## 2022-06-30 MED ORDER — NICOTINE 7 MG/24HR TD PT24
7.0000 mg | MEDICATED_PATCH | Freq: Every day | TRANSDERMAL | Status: DC
  Administered 2022-06-30 – 2022-07-04 (×5): 7 mg via TRANSDERMAL
  Filled 2022-06-30 (×5): qty 1

## 2022-06-30 MED ORDER — TEMAZEPAM 15 MG PO CAPS
15.0000 mg | ORAL_CAPSULE | Freq: Every evening | ORAL | Status: DC | PRN
  Administered 2022-07-02 – 2022-07-03 (×2): 15 mg via ORAL
  Filled 2022-06-30 (×2): qty 1

## 2022-06-30 MED ORDER — ONDANSETRON HCL 4 MG/2ML IJ SOLN: 4.0000 mg | Freq: Four times a day (QID) | INTRAMUSCULAR | Status: DC | PRN

## 2022-06-30 MED ORDER — ASPIRIN 81 MG PO CHEW
81.0000 mg | CHEWABLE_TABLET | Freq: Every day | ORAL | Status: DC
  Administered 2022-07-01 – 2022-07-04 (×4): 81 mg via ORAL
  Filled 2022-06-30 (×4): qty 1

## 2022-06-30 NOTE — ED Notes (Signed)
Back from CT, into room, no changes.

## 2022-06-30 NOTE — ED Provider Notes (Signed)
[facility] Provider Note   CSN: [id number] Arrival date & time: 06/30/22  1256  An emergency department physician performed an initial assessment on this suspected stroke patient at 1340.  History  Chief Complaint  Patient presents with   Numbness    Jonathan [name] is a 47 y.o. male.  HPI 47 yo male history of hypertension, noncompliance with medications, presents today with onset of left arm and leg weakness and numbness that began last night at 8 PM.  He did not seek medical evaluation prior to today.  He states that his left leg keeps giving out on him.  He denies any visual changes or difficulty speaking or communicating     Home Medications Prior to Admission medications   Medication Sig Start Date End Date Taking? Authorizing Provider  amLODipine (NORVASC) 5 MG tablet Take 1 tablet (5 mg total) by mouth daily. 11/01/21  Yes Talbot Grumbling, FNP  ibuprofen (ADVIL) 800 MG tablet Take 1 tablet (800 mg total) by mouth every 8 (eight) hours as needed. Patient taking differently: Take 800 mg by mouth every 8 (eight) hours as needed (for headaches). 04/24/22  Yes Mar Daring, PA-C  Multiple Vitamins-Minerals (MULTIVITAMIN MEN) TABS Take 1 tablet by mouth daily with breakfast.   Yes [provider]  tiZANidine (ZANAFLEX) 4 MG tablet Take 1 tablet (4 mg total) by mouth every 6 (six) hours as needed for muscle spasms. Patient not taking: Reported on 06/30/2022 11/01/21   Talbot Grumbling, FNP  traZODone (DESYREL) 50 MG tablet Take 0.5-2 tablets (25-100 mg total) by mouth at bedtime. Patient not taking: Reported on 06/30/2022 06/21/21   Barrett Henle, MD      Allergies    Patient has no known allergies.    Review of Systems   Review of Systems  Physical Exam Updated Vital Signs BP (!) 161/114   Pulse 92   Temp 99 F (37.2 C) (Oral)   Resp (!) 26   SpO2 100%  Physical Exam Vitals and nursing  note reviewed.  Constitutional:      Appearance: Normal appearance. He is obese.  HENT:     Head: Normocephalic.     Right Ear: External ear normal.     Left Ear: External ear normal.     Nose: Nose normal.     Mouth/Throat:     Pharynx: Oropharynx is clear.  Eyes:     Extraocular Movements: Extraocular movements intact.     Pupils: Pupils are equal, round, and reactive to light.  Cardiovascular:     Rate and Rhythm: Normal rate.     Pulses: Normal pulses.     Heart sounds: No murmur heard. Pulmonary:     Effort: Pulmonary effort is normal.  Abdominal:     General: Abdomen is flat. Bowel sounds are normal.  Musculoskeletal:        General: Normal range of motion.     Cervical back: Normal range of motion.  Skin:    General: Skin is warm and dry.     Capillary Refill: Capillary refill takes less than 2 seconds.  Neurological:     Mental Status: He is alert.     Comments: Unable to left left arm against gravity Left leg at 4 out of 5 strength No facial asymmetry No visual field cut  Psychiatric:        Mood and Affect: Mood normal.        Behavior:  Behavior normal.     ED Results / Procedures / Treatments   Labs (all labs ordered are listed, but only abnormal results are displayed) Labs Reviewed  COMPREHENSIVE METABOLIC PANEL - Abnormal; Notable for the following components:      Result Value   Glucose, Bld 125 (*)    Creatinine, Ser 1.37 (*)    All other components within normal limits  I-STAT CHEM 8, ED - Abnormal; Notable for the following components:   Creatinine, Ser 1.30 (*)    Glucose, Bld 125 (*)    All other components within normal limits  CBG MONITORING, ED - Abnormal; Notable for the following components:   Glucose-Capillary 132 (*)    All other components within normal limits  ETHANOL  PROTIME-INR  APTT  CBC  DIFFERENTIAL  RAPID URINE DRUG SCREEN, HOSP PERFORMED  URINALYSIS, ROUTINE W REFLEX MICROSCOPIC  I-STAT CHEM 8, ED    EKG EKG  Interpretation  Date/Time:  Thursday June 30 2022 13:04:28 EST Ventricular Rate:  98 PR Interval:  186 QRS Duration: 84 QT Interval:  330 QTC Calculation: 422 R Axis:   55 Text Interpretation: Sinus rhythm LAE, consider biatrial enlargement Probable left ventricular hypertrophy Abnormal T, consider ischemia, inferior leads Confirmed by Pattricia Boss [phone]) on 06/30/2022 2:18:08 PM  Radiology No results found.  Procedures .Critical Care  Performed by: Pattricia Boss, MD Authorized by: Pattricia Boss, MD   Critical care provider statement:    Critical care time (minutes):  60   Critical care was necessary to treat or prevent imminent or life-threatening deterioration of the following conditions:  CNS failure or compromise   Critical care was time spent personally by me on the following activities:  Development of treatment plan with patient or surrogate, discussions with consultants, evaluation of patient's response to treatment, examination of patient, ordering and review of laboratory studies, ordering and review of radiographic studies, ordering and performing treatments and interventions, pulse oximetry, re-evaluation of patient's condition and review of old charts     Medications Ordered in ED Medications  aspirin tablet 325 mg (325 mg Oral Given 06/30/22 1417)  clopidogrel (PLAVIX) tablet 75 mg (75 mg Oral Given 06/30/22 1418)    ED Course/ Medical Decision Making/ A&P                             Medical Decision Making Amount and/or Complexity of Data Reviewed Radiology: ordered.  Risk OTC drugs. Prescription drug management.   This patient presents to the ED for concern of weakness, this involves an extensive number of treatment options, and is a complaint that carries with it a high risk of complications and morbidity.  The differential diagnosis includes ischemic stroke, hemorrhagic stroke, hypoglycemia, intracranial mass, av malformation Patient presents >16  hours after onset of symptoms and does not meet lvo criteria   Co morbidities that complicate the patient evaluation  Hypertensions Noncompliance with medications   Additional history obtained:  Additional history obtained from family External records from outside source obtained and reviewed including none   Lab Tests:  I Ordered, and personally interpreted labs.  The pertinent results include:  labs, ct ekg   Imaging Studies ordered:  I ordered imaging studies including cthead  I independently visualized and interpreted imaging which showed no bleeding I agree with the radiologist interpretation   Cardiac Monitoring: / EKG:  The patient was maintained on a cardiac monitor.  I personally viewed and interpreted the  cardiac monitored which showed an underlying rhythm of: nsr hypertension   Consultations Obtained:  I requested consultation with the neurology,  and discussed lab and imaging findings as well as pertinent plan - they recommend: ct admission to stroke service   Problem List / ED Course / Critical interventions / Medication management  stroke I ordered medication including asa and plavix   for stroke  Reevaluation of the patient after these medicines showed that the patient stayed the same I have reviewed the patients home medicines and have made adjustments as needed   Social Determinants of Health:  Tobacco use noncompliance with meds  Test / Admission - Considered:  Discussed with stroke md and hospitalist consult Last bp 163/110         Final Clinical Impression(s) / ED Diagnoses Final diagnoses:  Cerebrovascular accident (CVA), unspecified mechanism ([place])    Rx / [place] Orders ED Discharge Orders     None         Pattricia Boss, MD 06/30/22 1425

## 2022-06-30 NOTE — ED Notes (Addendum)
Pt to go to MRI at [facility], ready now. Removing jewelry. MRI transport/tech at [facility].

## 2022-06-30 NOTE — ED Notes (Signed)
EDP at [facility] 

## 2022-06-30 NOTE — ED Notes (Signed)
Pt remains alert, NAD, calm, interactive. NIH 3. To CT now on monitor with paramedic.

## 2022-06-30 NOTE — H&P (Addendum)
History and Physical  Jonathan [name] [id number] DOB: Dec 12, 1975 DOA: 06/30/2022   PCP: Larina Earthly, MD   Patient coming from: Home   Chief Complaint: Left arm and leg weakness   HPI: Jonathan [name] is a 47 y.o. male with medical history significant for ongoing tobacco abuse of 1/2 pack/day, hypertension who takes his amlodipine only intermittently and is being admitted for left arm weakness and left leg weakness which started last night, and is being admitted to the hospital with suspected acute stroke.  History was provided by the patient, who tells me that he has amlodipine which she is supposed to take for blood pressure, probably takes it about half of the days, last took it last night.  About 2 weeks ago, there was 1 morning when he woke up from sleeping, and noticed that his left leg felt a little bit weak, however he was able to ambulate and the leg eventually went back to normal.  He was then completely normal, until last evening about 8 PM when he was taking a nap, woke up and noticed that his left leg again felt kind of weak, when he tried to stand on it, his left leg kept giving out.  He also noticed that he had significant left arm weakness, with some paresthesias in the ulnar side of the left arm.  He was unable to lift his left arm, was unable to grip.  He did not come to the ER for evaluation until this afternoon.  Since last night, the symptoms have been persistent without any significant change.  Patient denies any recent changes in medications, any recent illness specifically including chest pain, nausea, vomiting, fevers, chills, etc.  ED Course: Emergency department, he was noted to have an initial blood pressure of 191/116.  Stat noncontrast head CT shows small acute infarct in the right basal ganglia. .  Patient was given aspirin and Plavix, seen by teleneurologist Dr. Quinn Axe.  Recommendation is patient to be admitted to the hospitalist service at [facility].  Review of Systems: Please see HPI for pertinent positives and negatives. A complete 10 system review of systems are otherwise negative.  Past Medical History:  Diagnosis Date   Hypertension    Lower back pain    History reviewed. No pertinent surgical history.  Social History:  reports that he has been smoking cigarettes. He has been smoking an average of 1 pack per day. He has never used smokeless tobacco. He reports current alcohol use. He reports current drug use. Drug: Marijuana.   No Known Allergies  Family History  Problem Relation Age of Onset   Cancer Mother    Hypertension Other      Prior to Admission medications   Medication Sig Start Date End Date Taking? Authorizing Provider  amLODipine (NORVASC) 5 MG tablet Take 1 tablet (5 mg total) by mouth daily. 11/01/21  Yes Talbot Grumbling, FNP  ibuprofen (ADVIL) 800 MG tablet Take 1 tablet (800 mg total) by mouth every 8 (eight) hours as needed. Patient taking differently: Take 800 mg by mouth every 8 (eight) hours as needed (for headaches). 04/24/22  Yes Mar Daring, PA-C  Multiple Vitamins-Minerals (MULTIVITAMIN MEN) TABS Take 1 tablet by mouth daily with breakfast.   Yes [provider]  tiZANidine (ZANAFLEX) 4 MG tablet Take 1 tablet (4 mg total) by mouth every 6 (six) hours as needed for muscle spasms. Patient not taking: Reported on 06/30/2022 11/01/21   Talbot Grumbling, FNP  traZODone (DESYREL) 50 MG tablet Take 0.5-2 tablets (25-100 mg total) by mouth at bedtime. Patient not taking: Reported on 06/30/2022 06/21/21   Barrett Henle, MD    Physical Exam: BP (!) 161/114   Pulse 92   Temp 99 F (37.2 C) (Oral)   Resp (!) 26   SpO2 100%   General:  Alert, oriented, calm, in no acute distress, speech is clear Eyes: EOMI, clear conjuctivae, white sclerea Neck: supple, no masses, trachea mildline  Cardiovascular: RRR, no murmurs or rubs, no peripheral edema  Respiratory: clear to  auscultation bilaterally, no wheezes, no crackles  Abdomen: soft, nontender, nondistended, normal bowel tones heard  Skin: dry, no rashes  Musculoskeletal: no joint effusions, normal range of motion  Psychiatric: appropriate affect, normal speech  Neurologic: extraocular muscles intact, clear speech, able to lift the left arm against gravity, he has 1 out of 5 grip strength.  4 out of 5 strength in the left hip flexor.         Labs on Admission:  Basic Metabolic Panel: Recent Labs  Lab 06/30/22 1335 06/30/22 1340  NA 141 142  K 3.6 3.7  CL 107 106  CO2 22  --   GLUCOSE 125* 125*  BUN 12 11  CREATININE 1.37* 1.30*  CALCIUM 9.1  --    Liver Function Tests: Recent Labs  Lab 06/30/22 1335  AST 21  ALT 19  ALKPHOS 66  BILITOT 0.6  PROT 8.0  ALBUMIN 4.2   No results for input(s): "LIPASE", "AMYLASE" in the last 168 hours. No results for input(s): "AMMONIA" in the last 168 hours. CBC: Recent Labs  Lab 06/30/22 1335 06/30/22 1340  WBC 6.4  --   NEUTROABS 4.3  --   HGB 14.7 14.3  HCT 43.2 42.0  MCV 89.6  --   PLT 270  --    Cardiac Enzymes: No results for input(s): "CKTOTAL", "CKMB", "CKMBINDEX", "TROPONINI" in the last 168 hours.  BNP (last 3 results) No results for input(s): "BNP" in the last 8760 hours.  ProBNP (last 3 results) No results for input(s): "PROBNP" in the last 8760 hours.  CBG: Recent Labs  Lab 06/30/22 1341  GLUCAP 132*    Radiological Exams on Admission: Head CT with Suspect small acute infarct in the right basal ganglia.   Assessment/Plan Principal Problem:   Acute ischemic stroke -likely cause of his presentation is acute stroke seen on CT scan of the head, though results are currently not visible.  He has risk factors including tobacco abuse and uncontrolled hypertension. -Inpatient admission to [facility] neurology consultation on arrival -Permissive hypertension, hold home amlodipine -Regular diet if passes nurse  bedside swallow screen; otherwise keep n.p.o. and consult SLP -PT/OT consult -Noncontrast brain MRI and CTA head and neck are pending, as is echo -Hemoglobin A1c, fasting lipids -ASA/Plavix, statin Active Problems:   Tobacco abuse -5 minutes were spent counseling the patient on the need for complete cessation of tobacco abuse, nicotine patch for now   Essential hypertension   Noncompliance with medication regimen  DVT prophylaxis: Lovenox  Code Status: Full code  Consults called: EDP discussed with Dr. Quinn Axe   Admission status: Inpatient   Time spent: 42 minutes  Ollin Hochmuth Neva Seat MD [facility] Pager [phone]  If 7PM-7AM, please contact night-coverage [email or web address] Password [facility]  06/30/2022, 2:48 PM

## 2022-06-30 NOTE — ED Provider Triage Note (Signed)
Emergency Medicine Provider Triage Evaluation Note  Jonathan [name] , a 47 y.o. male  was evaluated in triage.  Pt complains of left-sided numbness and weakness starting at 8 PM yesterday evening.  Constant, worse to left upper extremity but also somewhat in the left.  Denies any chest pain, no diplopia, not on blood thinners.  Smokes cigarettes and has hypertension.  No previous TIA or stroke.  Review of Systems  Per HPI  Physical Exam  BP (!) 191/113 (BP Location: Right Arm)   Pulse 98   Temp 99 F (37.2 C) (Oral)   Resp 20   SpO2 100%  Gen:   Awake, no distress   Resp:  Normal effort  MSK:   Moves extremities without difficulty  Other:  cranial nerves II to XII are grossly intact.  Left shoulder shrug weaker compared to right, left grip strength reduced compared to right.  Unable to hold upper extremity against gravity.  Plantarflexion dorsiflexion 5/5 bilateral.  EOMI, no neglect  Medical Decision Making  Medically screening exam initiated at 1:11 PM.  Appropriate orders placed.  Jonathan [name] was informed that the remainder of the evaluation will be completed by another provider, this initial triage assessment does not replace that evaluation, and the importance of remaining in the ED until their evaluation is complete.  Outside of window to activate as a code stroke, Lucianne Lei negative, will initiate TIA/stroke workup but not a code stroke activation   Sherrill Raring, [place] 06/30/22 1313

## 2022-06-30 NOTE — Progress Notes (Signed)
Code stroke activated per elert @ [id number].  Pt with blurred vision and left arm weakness and sensation changes LKWT 2000 last PM.  Dr Quinn Axe notified @ 1340, on camera to assess patient @ 1345.  Pt to CT @1352$  when assessment complete and CT scanner available. Called CT @ [id number] and notified Lyka to wait for Quinn Axe to review NCCT before proceeding with advanced imaging.  39 called Eddie in [place] and reported that Dr Quinn Axe had reviewed imaging and did not wish to proceed with CTA and CTP. Pt to return to ER.    MRS 0  Jonathan [name] telelstroke RN

## 2022-06-30 NOTE — ED Notes (Signed)
PA aware of patient

## 2022-06-30 NOTE — ED Notes (Signed)
Dr. Quinn Axe tele neuro present, exam in progress

## 2022-06-30 NOTE — Consult Note (Signed)
NEUROLOGY TELECONSULTATION NOTE   Date of service: June 30, 2022 Patient Name: Jonathan [name] MRN:  [id number] DOB:  01/28/1976 Reason for consult: L sided weakness  Requesting Provider: Dr. Pattricia Boss Consult Participants: myself, patient, bedside RN, telestroke RN Location of the provider: [facility] Location of the patient: [facility]  This consult was provided via telemedicine with 2-way video and audio communication. The patient/family was informed that care would be provided in this way and agreed to receive care in this manner.   _ _ _   _ __   _ __ _ _  __ __   _ __   __ _  History of Present Illness   This is a 72 man with hx HTN, tobacco abuse who presented to ED with LUE weakness and sensory deficit. LKW 2000 last night when he went to take a nap. A few hours later when he woke up his LLE felt weak and he was unable to ambulate. He was unable to lift his LUE against gravity or grip with his L hand. He did not seek medical attention until this afternoon. Exam is remarkable for LUE weakness and L face and LUE numbness. NIHSS = 3. CT head showed small acute infarct R basal ganglia personal review. TNK not administered 2/2 presentation outside the window. Exam and imaging not consistent with LVO and patient would have been outside the window for intervention 2/2 completed infarct on CT therefore CTA was not performed as part of the stroke code.   ROS   Per HPI; all other systems reviewed and are negative  Past History   The following was personally reviewed:  Past Medical History:  Diagnosis Date   Hypertension    Lower back pain    History reviewed. No pertinent surgical history. Family History  Problem Relation Age of Onset   Cancer Mother    Hypertension Other    Social History   Socioeconomic History   Marital status: Married    Spouse name: Not on file   Number of children: Not on file   Years of education: Not on file   Highest education level: Not on file   Occupational History   Not on file  Tobacco Use   Smoking status: Every Day    Packs/day: 1.00    Types: Cigarettes   Smokeless tobacco: Never  Vaping Use   Vaping Use: Never used  Substance and Sexual Activity   Alcohol use: Yes   Drug use: Yes    Types: Marijuana   Sexual activity: Not on file  Other Topics Concern   Not on file  Social History Narrative   Not on file   Social Determinants of Health   Financial Resource Strain: Not on file  Food Insecurity: Not on file  Transportation Needs: Not on file  Physical Activity: Not on file  Stress: Not on file  Social Connections: Not on file   No Known Allergies  Medications   (Not in a hospital admission)    No current facility-administered medications for this encounter.  Current Outpatient Medications:    amLODipine (NORVASC) 5 MG tablet, Take 1 tablet (5 mg total) by mouth daily., Disp: 30 tablet, Rfl: 3   aspirin 325 MG tablet, Take 325 mg by mouth daily., Disp: , Rfl:    ibuprofen (ADVIL) 800 MG tablet, Take 1 tablet (800 mg total) by mouth every 8 (eight) hours as needed., Disp: 30 tablet, Rfl: 0   tiZANidine (ZANAFLEX) 4 MG tablet, Take  1 tablet (4 mg total) by mouth every 6 (six) hours as needed for muscle spasms., Disp: 30 tablet, Rfl: 0   traZODone (DESYREL) 50 MG tablet, Take 0.5-2 tablets (25-100 mg total) by mouth at bedtime., Disp: 20 tablet, Rfl: 0  Vitals   Vitals:   06/30/22 1310 06/30/22 1315 06/30/22 1320 06/30/22 1325  BP:      Pulse:      Resp: (!) 21 16 (!) 21 15  Temp:      TempSrc:      SpO2:         There is no height or weight on file to calculate BMI.  Physical Exam   Exam performed over telemedicine with 2-way video and audio communication and with assistance of bedside RN  Physical Exam Gen: A&O x4, NAD Resp: normal WOB CV: extremities appear well-perfused  Neuro: *MS: A&O x4. Follows multi-step commands.  *Speech: nondysarthric, no aphasia, able to name and  repeat *CN: PERRL 76m, EOMI, VFF by confrontation, L face numbness, smile symmetric, hearing intact to voice *Motor:   Normal bulk.  No tremor, rigidity or bradykinesia. LUE drift to bed, all other extremities full strength *Sensory: LUE numbness, otherwise intact. No double-simultaneous extinction.  *Coordination:  Finger-to-nose, heel-to-shin, rapid alternating motions were intact. *Reflexes:  UTA 2/2 tele-exam *Gait: deferred  NIHSS = 3 (2 for LUE drift and 1 for sensory deficit)   Premorbid mRS = 0   Labs   CBC:  Recent Labs  Lab 06/30/22 1335 06/30/22 1340  WBC 6.4  --   NEUTROABS 4.3  --   HGB 14.7 14.3  HCT 43.2 42.0  MCV 89.6  --   PLT 270  --     Basic Metabolic Panel:  Lab Results  Component Value Date   NA 142 06/30/2022   K 3.7 06/30/2022   CO2 24 01/03/2022   GLUCOSE 125 (H) 06/30/2022   BUN 11 06/30/2022   CREATININE 1.30 (H) 06/30/2022   CALCIUM 8.6 (L) 01/03/2022   GFRNONAA >60 01/03/2022   GFRAA 80 (L) 12/20/2011   Lipid Panel: No results found for: "LDLCALC" HgbA1c: No results found for: "HGBA1C" Urine Drug Screen: No results found for: "LABOPIA", "COCAINSCRNUR", "LABBENZ", "AMPHETMU", "THCU", "LABBARB"  Alcohol Level No results found for: "ETH"   Impression   This is a 89man with hx HTN, tobacco abuse who presented to ED with LUE weakness and sensory deficit since last night c/w acute ischemic stroke confirmed. CT head confirms evolving small acute infarct in R basal ganglia which localizes to his symptoms. He is outside the window for TNK.  Recommendations   - [facility]; [facility] - Permissive HTN x48 hrs from sx onset or until stroke ruled out by MRI goal BP <220/110. PRN labetalol or hydralazine if BP above these parameters. Avoid oral antihypertensives. - MRI brain wo contrast - CTA or MRA H&N - TTE w/ bubble - Check A1c and LDL + add statin per guidelines - ASA 859mdaily  + plavix 7511maily x21 days f/b ASA 55m32mily monotherapy after that - q4 hr neuro checks - STAT head CT for any change in neuro exam - Tele - PT/OT/SLP - Stroke education - Amb referral to neurology upon discharge   [facility]____________________________________________________________________   Thank you for the opportunity to take part in the care of this patient. If you have any further questions,  please contact the neurology consultation attending.  Signed,  Su Monks, MD [facility] [phone]  If 7pm- 7am, please page neurology on call as listed in [place].  **Any copied and pasted documentation in this note was written by me in another application not billed for and pasted by me into this document.

## 2022-06-30 NOTE — ED Triage Notes (Addendum)
C/o left sided weakness/numbness with sob. LKN 2000.  Intermittent blurry vision x2 months Pt reports falling at 2100 due to weakness.  Denies hitting head. Denies blood thinner usage.  Denies cp

## 2022-07-01 ENCOUNTER — Inpatient Hospital Stay (HOSPITAL_COMMUNITY): Payer: Medicaid Other

## 2022-07-01 DIAGNOSIS — I6389 Other cerebral infarction: Secondary | ICD-10-CM | POA: Diagnosis not present

## 2022-07-01 LAB — LIPID PANEL
Cholesterol: 148 mg/dL (ref 0–200)
HDL: 35 mg/dL — ABNORMAL LOW (ref >40)
LDL Cholesterol: 81 mg/dL (ref 0–99)
Total CHOL/HDL Ratio: 4.2 RATIO
Triglycerides: 161 mg/dL — ABNORMAL HIGH (ref <150)
VLDL: 32 mg/dL (ref 0–40)

## 2022-07-01 LAB — ECHOCARDIOGRAM COMPLETE
Area-P 1/2: 3.91 cm2
Height: 72 in
S' Lateral: 3.6 cm
Single Plane A2C EF: 47.2 %
Weight: 4162.28 oz

## 2022-07-01 MED ORDER — IOHEXOL 350 MG/ML SOLN
75.0000 mL | Freq: Once | INTRAVENOUS | Status: AC | PRN
  Administered 2022-07-01: 75 mL via INTRAVENOUS

## 2022-07-01 MED ORDER — ACETAMINOPHEN 325 MG PO TABS
650.0000 mg | ORAL_TABLET | Freq: Four times a day (QID) | ORAL | Status: DC | PRN
  Administered 2022-07-03 – 2022-07-04 (×2): 650 mg via ORAL
  Filled 2022-07-01 (×2): qty 2

## 2022-07-01 NOTE — Evaluation (Signed)
Physical Therapy Evaluation Patient Details Name: Jonathan [name] MRN: [id number] DOB: [birth year] Today's Date: 07/01/2022  History of Present Illness  Jonathan [name] is a 47 y.o. male admitted on 06/30/22 for left arm weakness and left leg weakness. MRI: small acute infarct in the descending white matter tracks of the right basal ganglia and overlying frontal lobe. PHMX: PHMx: tobacco abuse of 1/2 pack/day, hypertension who takes his amlodipine only intermittently.  Clinical Impression  Pt was able to walk with min assist for one significant LOB and for safety as the further we went the more he had a flexion moment at L knee in stance during gait and decreased foot clearance on L foot. He has some significant safety concerns related to safe mobility while his left leg and arm are so weak (keep finding him up in his room on his own).  He would benefit from intensive post acute rehab to get him at a higher level of function prior to d/c home where I am afraid he will do things that are unsafe.  PT to follow acutely for deficits listed below.        Recommendations for follow up therapy are one component of a multi-disciplinary discharge planning process, led by the attending physician.  Recommendations may be updated based on patient status, additional functional criteria and insurance authorization.  Follow Up Recommendations Acute inpatient rehab (3hours/day)      Assistance Recommended at Discharge Intermittent Supervision/Assistance  Patient can return home with the following  A little help with walking and/or transfers    Equipment Recommendations None recommended by PT  Recommendations for Other Services  Rehab consult    Functional Status Assessment Patient has had a recent decline in their functional status and demonstrates the ability to make significant improvements in function in a reasonable and predictable amount of time.     Precautions / Restrictions  Precautions Precautions: Fall Restrictions Weight Bearing Restrictions: No      Mobility  Bed Mobility Overal bed mobility: Modified Independent                  Transfers Overall transfer level: Needs assistance Equipment used: None Transfers: Sit to/from Stand Sit to Stand: Min assist           General transfer comment: Min assist for balance during transitions.  Fell back on the bed x 1    Ambulation/Gait Ambulation/Gait assistance: Herbalist (Feet): 150 Feet Assistive device: 1 person hand held assist Gait Pattern/deviations: Step-through pattern, Decreased dorsiflexion - left, Knee flexed in stance - left Gait velocity: decreased Gait velocity interpretation: 1.31 - 2.62 ft/sec, indicative of limited community ambulator   General Gait Details: Pt with staggering gait pattern requiring min assist to recover from LOB.  Pt with flexed left knee in stance with difficulty with placement of foot and increased weakness with further distance, presenting as buckling into flexion L knee and decreased foot clearance L side during gait.  Stairs            Wheelchair Mobility    Modified Rankin (Stroke Patients Only) Modified Rankin (Stroke Patients Only) Pre-Morbid Rankin Score: No symptoms Modified Rankin: Moderately severe disability     Balance Overall balance assessment: Needs assistance Sitting-balance support: No upper extremity supported Sitting balance-Leahy Scale: Good     Standing balance support: No upper extremity supported Standing balance-Leahy Scale: Fair Standing balance comment: close supervision in static standing.  Pertinent Vitals/Pain Pain Assessment Pain Assessment: No/denies pain    Home Living Family/patient expects to be discharged to:: Private residence Living Arrangements: Spouse/significant other;Children Available Help at Discharge: Family;Available 24  hours/day Type of Home: House Home Access: Stairs to enter Entrance Stairs-Rails: None Entrance Stairs-Number of Steps: 4   Home Layout: One level Home Equipment: None Additional Comments: fiance works night shift, 47 yo is home at night    Prior Function Prior Level of Function : Independent/Modified Independent;Driving             Mobility Comments: 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 ADLs Comments: fiance does A him with his socks "I'm too fat"     Hand Dominance   Dominant Hand: Right    Extremity/Trunk Assessment   Upper Extremity Assessment Upper Extremity Assessment: Defer to OT evaluation LUE Deficits / Details: shoulder and composite finger flex/ext 2/5, rest 3/5 LUE Sensation: decreased light touch LUE Coordination: decreased fine motor;decreased gross motor    Lower Extremity Assessment Lower Extremity Assessment: LLE deficits/detail LLE Deficits / Details: left leg is 3+/5 throughout with deminished LT sensation LLE Sensation: decreased light touch    Cervical / Trunk Assessment Cervical / Trunk Assessment: Normal  Communication   Communication: No difficulties  Cognition Arousal/Alertness: Awake/alert Behavior During Therapy: WFL for tasks assessed/performed Overall Cognitive Status: Impaired/Different from baseline Area of Impairment: Safety/judgement                         Safety/Judgement: Decreased awareness of safety     General Comments: Pt with no significant cognitive deficits, but is a more risky than someone should be with left sided weakness.        General Comments General comments (skin integrity, edema, etc.): Gave pt UE exercise handout from the OT    Exercises General Exercises - Lower Extremity Toe Raises: AROM, Both, 10 reps, Standing Heel Raises: AROM, Both, 10 reps, Standing Other Exercises Other Exercises: standing knee flexion bil holding to sink x 10 each bil.   Assessment/Plan    PT Assessment  Patient needs continued PT services  PT Problem List Decreased strength;Decreased activity tolerance;Decreased balance;Decreased mobility;Decreased coordination;Decreased cognition;Decreased knowledge of use of DME;Impaired sensation       PT Treatment Interventions DME instruction;Gait training;Stair training;Functional mobility training;Therapeutic exercise;Therapeutic activities;Balance training;Cognitive remediation;Patient/family education    PT Goals (Current goals can be found in the Care Plan section)  Acute Rehab PT Goals Patient Stated Goal: to get home PT Goal Formulation: With patient Time For Goal Achievement: 07/16/22 Potential to Achieve Goals: Good    Frequency Min 4X/week     Co-evaluation               AM-PAC PT "6 Clicks" Mobility  Outcome Measure Help needed turning from your back to your side while in a flat bed without using bedrails?: None Help needed moving from lying on your back to sitting on the side of a flat bed without using bedrails?: A Little Help needed moving to and from a bed to a chair (including a wheelchair)?: A Little Help needed standing up from a chair using your arms (e.g., wheelchair or bedside chair)?: A Little Help needed to walk in hospital room?: A Little Help needed climbing 3-5 steps with a railing? : A Little 6 Click Score: 19    End of Session Equipment Utilized During Treatment: Gait belt Activity Tolerance: Patient limited by fatigue Patient left: in bed;with call  bell/phone within reach   PT Visit Diagnosis: Muscle weakness (generalized) (M62.81);Difficulty in walking, not elsewhere classified (R26.2);Hemiplegia and hemiparesis Hemiplegia - Right/Left: Left Hemiplegia - dominant/non-dominant: Non-dominant Hemiplegia - caused by: Cerebral infarction    Time: [id number] PT Time Calculation (min) (ACUTE ONLY): 20 min   Charges:   PT Evaluation $PT Eval Moderate Complexity: 1 Mod         Verdene Lennert, PT, DPT   Acute Rehabilitation Secure chat is best for contact #[phone] office

## 2022-07-01 NOTE — Evaluation (Signed)
Occupational Therapy Evaluation Patient Details Name: Jonathan [name] MRN: [id number] DOB: [birth year] Today's Date: 07/01/2022   History of Present Illness ELIAM THILGES is a 47 y.o. male admitted for left arm weakness and left leg weakness. MRI: small acute infarct in the descending white matter tracks of the right basal ganglia and overlying frontal lobe. PHMX: PHMx: tobacco abuse of 1/2 pack/day, hypertension who takes his amlodipine only intermittently.   Clinical Impression   This 47 yo male admitted with above presents to acute OT with PLOF of being independent with basic ADLs, IADLs, and driving. He currently is limited by decreased use of LUE and LLE thus leading to decreased balance and decreased safety. He will continue to benefit from acute OT with follow up on AIR.      Recommendations for follow up therapy are one component of a multi-disciplinary discharge planning process, led by the attending physician.  Recommendations may be updated based on patient status, additional functional criteria and insurance authorization.   Follow Up Recommendations  Acute inpatient rehab (3hours/day)     Assistance Recommended at Discharge Intermittent Supervision/Assistance  Patient can return home with the following A little help with walking and/or transfers;A little help with bathing/dressing/bathroom;Help with stairs or ramp for entrance;Assist for transportation;Assistance with cooking/housework    Functional Status Assessment  Patient has had a recent decline in their functional status and demonstrates the ability to make significant improvements in function in a reasonable and predictable amount of time.  Equipment Recommendations  Other (comment) (TBD next venue)    Recommendations for Other Services Rehab consult     Precautions / Restrictions Precautions Precautions: Fall Restrictions Weight Bearing Restrictions: No      Mobility Bed Mobility Overal bed  mobility: Modified Independent             General bed mobility comments: increased time due to L sided weakness    Transfers Overall transfer level: Needs assistance Equipment used: None Transfers: Sit to/from Stand Sit to Stand: Min assist           General transfer comment: min A to ambulate in his room without AD--the further he ambulated the more his leg gave way      Balance Overall balance assessment: Needs assistance Sitting-balance support: No upper extremity supported, Feet supported Sitting balance-Leahy Scale: Good     Standing balance support: No upper extremity supported Standing balance-Leahy Scale: Fair                             ADL either performed or assessed with clinical judgement   ADL Overall ADL's : Needs assistance/impaired Eating/Feeding: Modified independent;Sitting Eating/Feeding Details (indicate cue type and reason): only using RUE Grooming: Min guard;Standing Grooming Details (indicate cue type and reason): really on using RUE Upper Body Bathing: Sitting;Minimal assistance   Lower Body Bathing: Minimal assistance;Sit to/from stand   Upper Body Dressing : Minimal assistance;Sitting   Lower Body Dressing: Minimal assistance;Sit to/from stand   Toilet Transfer: Minimal assistance;Ambulation Toilet Transfer Details (indicate cue type and reason): simulated bed>door>back to bed Toileting- Clothing Manipulation and Hygiene: Minimal assistance;Sit to/from stand               Vision Baseline Vision/History: 0 No visual deficits Ability to See in Adequate Light: 0 Adequate Patient Visual Report: No change from baseline Vision Assessment?: Yes Eye Alignment: Within Functional Limits Ocular Range of Motion: Within Functional Limits Alignment/Gaze Preference: Within Defined Limits  Tracking/Visual Pursuits: Able to track stimulus in all quads without difficulty Saccades: Within functional limits Convergence: Within  functional limits Visual Fields: No apparent deficits            Pertinent Vitals/Pain Pain Assessment Pain Assessment: No/denies pain     Hand Dominance Right   Extremity/Trunk Assessment Upper Extremity Assessment Upper Extremity Assessment: LUE deficits/detail LUE Deficits / Details: shoulder and composite finger flex/ext 2/5, rest 3/5 LUE Sensation: decreased light touch LUE Coordination: decreased fine motor;decreased gross motor           Communication Communication Communication: No difficulties   Cognition Arousal/Alertness: Awake/alert Behavior During Therapy: WFL for tasks assessed/performed Overall Cognitive Status: Impaired/Different from baseline Area of Impairment: Safety/judgement                         Safety/Judgement: Decreased awareness of safety     General Comments: Pt up in bathroom by himself when I first entered room and apparently also took a shower while in there --all by himself.                Home Living Family/patient expects to be discharged to:: Private residence Living Arrangements: Spouse/significant other;Children Available Help at Discharge: Family;Available 24 hours/day Type of Home: House Home Access: Stairs to enter [employer] of Steps: 4 Entrance Stairs-Rails: None Home Layout: One level     Bathroom Shower/Tub: Teacher, early years/pre: Standard     Home Equipment: None   Additional Comments: fiance works night shift, 47 yo is home at night      Prior Functioning/Environment Prior Level of Function : Independent/Modified Independent;Driving               ADLs Comments: fiance does A him with his socks "I'm too fat"        OT Problem List: Decreased strength;Decreased range of motion;Impaired balance (sitting and/or standing);Impaired UE functional use;Obesity      OT Treatment/Interventions: Self-care/ADL training;DME and/or AE instruction;Patient/family  education;Balance training;Therapeutic exercise;Therapeutic activities    OT Goals(Current goals can be found in the care plan section) Acute Rehab OT Goals Patient Stated Goal: to get out of hospital sooner than later OT Goal Formulation: With patient Time For Goal Achievement: 07/15/22 Potential to Achieve Goals: Good  OT Frequency: Min 2X/week       AM-PAC OT "6 Clicks" Daily Activity     Outcome Measure Help from another person eating meals?: None Help from another person taking care of personal grooming?: A Little Help from another person toileting, which includes using toliet, bedpan, or urinal?: A Little Help from another person bathing (including washing, rinsing, drying)?: A Little Help from another person to put on and taking off regular upper body clothing?: A Little Help from another person to put on and taking off regular lower body clothing?: A Little 6 Click Score: 19   End of Session Equipment Utilized During Treatment: Gait belt  Activity Tolerance: Patient tolerated treatment well Patient left: in bed;with call bell/phone within reach;with bed alarm set  OT Visit Diagnosis: Unsteadiness on feet (R26.81);Other abnormalities of gait and mobility (R26.89);Muscle weakness (generalized) (M62.81);Other symptoms and signs involving cognitive function;Hemiplegia and hemiparesis Hemiplegia - Right/Left: Left Hemiplegia - dominant/non-dominant: Non-Dominant Hemiplegia - caused by: Cerebral infarction                Time: [id number] OT Time Calculation (min): 20 min Charges:  OT General Charges $OT Visit: 1 Visit OT Evaluation $  OT Eval Moderate Complexity: [place] Office [phone]    Almon Register 07/01/2022, 12:58 PM

## 2022-07-01 NOTE — Progress Notes (Addendum)
Jonathan [name]  [id number] DOB: Oct 31, 1975 DOA: 06/30/2022 PCP: Larina Earthly, MD    Brief Narrative:  47 year old with a history of tobacco abuse and HTN noncompliant with medical therapy who presented to the hospital with the acute onset of left arm and leg weakness to the point that he was unable to lift his left arm or grip with his left hand, or stand without the left leg giving way.  CT head revealed a small acute infarct in the right basal ganglia.  Consultants:  Stroke Team  Goals of Care:  Code Status: Full Code   DVT prophylaxis: Lovenox  Interim Hx: Afebrile.  Vital signs stable.  Patient reports some mild improvement in his left arm weakness but still remains significantly weak with very poor grip strength.  Therapy has recommended an inpatient rehab stay.  He denies chest pain or shortness of breath.  Assessment & Plan:  Acute right basal ganglia ischemic stroke -Resultant left arm and leg weakness with loss of grip strength in left hand -MRI brain noted a small acute infarct in the descending white matter tracts of the right basal ganglia and overlying frontal lobe -CTa head and neck notes no emergent vessel findings without significant stenosis appreciated -Felt to be related to small vessel disease with cocaine abuse -TTE revealed EF 55-60% with no WMA and normal diastolic function and no atrial level shunt detected -medical treatment: Aspirin plus Plavix x 21 days then aspirin 81 mg alone -LDL 81 -A1c 5.9 -PT/OT/SLP -patient passed his stroke swallow screen -Allow for permissive hypertension -counseled to stop smoking and using cocaine   HTN Allow permissive hypertension for now  Tobacco abuse Patient has been counseled as to the absolute need to discontinue tobacco abuse  Cocaine abuse UDS positive for cocaine -this is likely directly linked to his CVA -patient has been warned that ongoing use of cocaine will likely lead to further CVAs, possible  MIs, and possible death  Family Communication:  Disposition: From home -awaiting determination on possible inpatient rehab by the rehab team   Objective: Blood pressure (!) 154/89, pulse 82, temperature 98.1 F (36.7 C), temperature source Oral, resp. rate 18, height 6' (1.829 m), weight 118 kg, SpO2 96 %. No intake or output data in the 24 hours ending 07/01/22 0832 Filed Weights   06/30/22 1930  Weight: 118 kg    Examination: General: No acute respiratory distress Lungs: Clear to auscultation bilaterally without wheezes or crackles Cardiovascular: Regular rate and rhythm without murmur gallop or rub normal S1 and S2 Abdomen: Nontender, nondistended, soft, bowel sounds positive, no rebound, no ascites, no appreciable mass Extremities: No significant cyanosis, clubbing, or edema bilateral lower extremities  CBC: Recent Labs  Lab 06/30/22 1335 06/30/22 1340 06/30/22 1624  WBC 6.4  --  6.9  NEUTROABS 4.3  --   --   HGB 14.7 14.3 14.5  HCT 43.2 42.0 42.4  MCV 89.6  --  88.9  PLT 270  --  [id number]   Basic Metabolic Panel: Recent Labs  Lab 06/30/22 1335 06/30/22 1340 06/30/22 1624  NA 141 142  --   K 3.6 3.7  --   CL 107 106  --   CO2 22  --   --   GLUCOSE 125* 125*  --   BUN 12 11  --   CREATININE 1.37* 1.30* 1.29*  CALCIUM 9.1  --   --    GFR: Estimated Creatinine Clearance: 94.9 mL/min (A) (by C-G formula based on SCr  of 1.29 mg/dL (H)).   Scheduled Meds:   stroke: early stages of recovery book   Does not apply Once   aspirin  81 mg Oral Daily   atorvastatin  20 mg Oral Daily   clopidogrel  75 mg Oral Daily   enoxaparin (LOVENOX) injection  40 mg Subcutaneous Q24H   nicotine  7 mg Transdermal Daily     LOS: 1 day   Cherene Altes, MD [facility] Office  [phone] Pager - Text Page per Shea Evans  If 7PM-7AM, please contact night-coverage per Amion 07/01/2022, 8:32 AM

## 2022-07-01 NOTE — Progress Notes (Signed)
  Echocardiogram 2D Echocardiogram has been performed.  Jonathan [name] 07/01/2022, 9:30 AM

## 2022-07-01 NOTE — Progress Notes (Signed)
Mobility Specialist: Progress Note   07/01/22 1617  Mobility  Activity Ambulated with assistance in hallway  Level of Assistance Contact guard assist, steadying assist  Assistive Device None  Distance Ambulated (ft) 300 ft  Activity Response Tolerated well  Mobility Referral Yes  $Mobility charge 1 Mobility   Pt received up at the sink in the room and agreeable to mobility. Contact guard during ambulation for balance d/t Lt knee instability/weakness. No overt LOB. Pt to the chair after session with call bell and phone at his side.   Jonathan [name] Mobility Specialist Please contact via SecureChat or Rehab office at [phone]

## 2022-07-01 NOTE — Progress Notes (Signed)
Stroke education given, specifically BEFAST, 911 activation and individual risk factor of HTN discussed.  [place] RN Stroke Response

## 2022-07-01 NOTE — Consult Note (Addendum)
Stroke Neurology Consultation Note  Consult Requested by: Dr. Thereasa Solo  Reason for Consult: Left-sided weakness and sensory deficit  Consult Date:  07/01/22  The history was obtained from the patient and chart.  During history and examination, all items were able to obtain unless otherwise noted.  History of Present Illness:  Jonathan [name] is an 47 y.o. African American male with PMH of hypertension and smoking who presented with left upper extremity weakness, left lower extremity weakness and left-sided sensory deficit.  Initial head CT showed small acute infarct in the right basal ganglia. MRI scan confirms a large right basal ganglia infarct extending into white matter.  CT angiogram of the brain and neck showed only mild atheromatous changes without large vessel stenosis or occlusion.  Urine drug screen is positive for cocaine.  Carotid ultrasound shows ejection fraction 55 to 60% without significant clot.  LDL cholesterol is 81 mg percent.  Hemoglobin A1c is 5.9.  Denies any prior history of strokes or TIAs.  Does admit to snoring and is fianc feels she may have sleep apnea but has never been evaluated for the same Date last known well: Date: 06/29/2022 Time last known well: Time: 20:00 tPA Given: No: Presented outside of window MRS:  0 NIHSS:  3  Past Medical History:  Diagnosis Date   Hypertension    Lower back pain      History reviewed. No pertinent surgical history.  Family History  Problem Relation Age of Onset   Cancer Mother    Hypertension Other      Social History:  reports that he has been smoking cigarettes. He has been smoking an average of 1 pack per day. He has never used smokeless tobacco. He reports current alcohol use. He reports current drug use. Drug: Marijuana.  Review of Systems: A full ROS was attempted today and was  able to be performed.  Systems assessed include - Constitutional, Eyes, HENT, Respiratory, Cardiovascular, Gastrointestinal,  Genitourinary, Integument/breast, Hematologic/lymphatic, Musculoskeletal, Neurological, Behavioral/Psych, Endocrine, Allergic/Immunologic - with pertinent responses as per HPI.  Allergies: No Known Allergies   Medications: Prior to Admission:  Medications Prior to Admission  Medication Sig Dispense Refill Last Dose   amLODipine (NORVASC) 5 MG tablet Take 1 tablet (5 mg total) by mouth daily. 30 tablet 3 06/29/2022   ibuprofen (ADVIL) 800 MG tablet Take 1 tablet (800 mg total) by mouth every 8 (eight) hours as needed. (Patient taking differently: Take 800 mg by mouth every 8 (eight) hours as needed (for headaches).) 30 tablet 0 unk   Multiple Vitamins-Minerals (MULTIVITAMIN MEN) TABS Take 1 tablet by mouth daily with breakfast.   06/30/2022 at am   tiZANidine (ZANAFLEX) 4 MG tablet Take 1 tablet (4 mg total) by mouth every 6 (six) hours as needed for muscle spasms. (Patient not taking: Reported on 06/30/2022) 30 tablet 0 Not Taking   traZODone (DESYREL) 50 MG tablet Take 0.5-2 tablets (25-100 mg total) by mouth at bedtime. (Patient not taking: Reported on 06/30/2022) 20 tablet 0 Not Taking   Scheduled:  aspirin  81 mg Oral Daily   atorvastatin  20 mg Oral Daily   clopidogrel  75 mg Oral Daily   enoxaparin (LOVENOX) injection  40 mg Subcutaneous Q24H   nicotine  7 mg Transdermal Daily   Continuous:  Test Results: CBC:  Recent Labs  Lab 06/30/22 1335 06/30/22 1340 06/30/22 1624  WBC 6.4  --  6.9  NEUTROABS 4.3  --   --   HGB 14.7 14.3  14.5  HCT 43.2 42.0 42.4  MCV 89.6  --  88.9  PLT 270  --  99991111   Basic Metabolic Panel:  Recent Labs  Lab 06/30/22 1335 06/30/22 1340 06/30/22 1624  NA 141 142  --   K 3.6 3.7  --   CL 107 106  --   CO2 22  --   --   GLUCOSE 125* 125*  --   BUN 12 11  --   CREATININE 1.37* 1.30* 1.29*  CALCIUM 9.1  --   --    Liver Function Tests: Recent Labs  Lab 06/30/22 1335  AST 21  ALT 19  ALKPHOS 66  BILITOT 0.6  PROT 8.0  ALBUMIN 4.2   No  results for input(s): "LIPASE", "AMYLASE" in the last 168 hours. No results for input(s): "AMMONIA" in the last 168 hours. Coagulation Studies:  Recent Labs    06/30/22 1335  LABPROT 12.7  INR 1.0   Cardiac Enzymes: No results for input(s): "CKTOTAL", "CKMB", "CKMBINDEX", "TROPONINI" in the last 168 hours. BNP: Invalid input(s): "POCBNP" CBG:  Recent Labs  Lab 06/30/22 1341  GLUCAP 132*   Urinalysis:  Recent Labs  Lab 06/30/22 [place] 1.006  PHURINE 6.0  GLUCOSEU NEGATIVE  HGBUR NEGATIVE  BILIRUBINUR NEGATIVE  KETONESUR NEGATIVE  PROTEINUR NEGATIVE  NITRITE NEGATIVE  LEUKOCYTESUR NEGATIVE   Microbiology: No results found for this or any previous visit. Lipid Panel:     Component Value Date/Time   CHOL 148 07/01/2022 0728   TRIG 161 (H) 07/01/2022 0728   HDL 35 (L) 07/01/2022 0728   CHOLHDL 4.2 07/01/2022 0728   VLDL 32 07/01/2022 0728   LDLCALC 81 07/01/2022 0728   HgbA1c:  Lab Results  Component Value Date   HGBA1C 5.9 (H) 06/30/2022   Urine Drug Screen:     Component Value Date/Time   LABOPIA NONE DETECTED 06/30/2022 1454   COCAINSCRNUR POSITIVE (A) 06/30/2022 1454   LABBENZ NONE DETECTED 06/30/2022 1454   AMPHETMU NONE DETECTED 06/30/2022 1454   THCU NONE DETECTED 06/30/2022 1454   LABBARB NONE DETECTED 06/30/2022 1454    Alcohol Level:  Recent Labs  Lab 06/30/22 1346  ETH <10    ECHOCARDIOGRAM COMPLETE  Result Date: 07/01/2022    ECHOCARDIOGRAM REPORT   Patient Name:   Jonathan [name] Date of Exam: 07/01/2022 Medical Rec #:  [id number]            Height:       72.0 in Accession #:    [id number]           Weight:       260.1 lb Date of Birth:  1975/11/12            BSA:          2.382 m Patient Age:    67 years             BP:           154/89 mmHg Patient Gender: M                    HR:           77 bpm. Exam Location:  Inpatient Procedure: 2D Echo, Cardiac Doppler and Color Doppler Indications:    stroke   History:        Patient has no prior history of Echocardiogram examinations.  Risk Factors:Hypertension and Current Smoker.  Sonographer:    Johny Chess RDCS Referring Phys: [id number] [place]  1. Left ventricular ejection fraction, by estimation, is 55 to 60%. The left ventricle has normal function. The left ventricle has no regional wall motion abnormalities. Left ventricular diastolic parameters were normal.  2. Right ventricular systolic function is normal. The right ventricular size is normal.  3. The mitral valve is normal in structure. No evidence of mitral valve regurgitation. No evidence of mitral stenosis.  4. The aortic valve is tricuspid. There is mild calcification of the aortic valve. There is mild thickening of the aortic valve. Aortic valve regurgitation is not visualized. Aortic valve sclerosis is present, with no evidence of aortic valve stenosis.  5. The inferior vena cava is normal in size with greater than 50% respiratory variability, suggesting right atrial pressure of 3 mmHg. Conclusion(s)/Recommendation(s): No intracardiac source of embolism detected on this transthoracic study. Consider a transesophageal echocardiogram to exclude cardiac source of embolism if clinically indicated. FINDINGS  Left Ventricle: Left ventricular ejection fraction, by estimation, is 55 to 60%. The left ventricle has normal function. The left ventricle has no regional wall motion abnormalities. The left ventricular internal cavity size was normal in size. There is  no left ventricular hypertrophy. Left ventricular diastolic parameters were normal. Right Ventricle: The right ventricular size is normal. No increase in right ventricular wall thickness. Right ventricular systolic function is normal. Left Atrium: Left atrial size was normal in size. Right Atrium: Right atrial size was normal in size. Pericardium: There is no evidence of pericardial effusion. Mitral Valve: The mitral  valve is normal in structure. No evidence of mitral valve regurgitation. No evidence of mitral valve stenosis. Tricuspid Valve: The tricuspid valve is normal in structure. Tricuspid valve regurgitation is trivial. No evidence of tricuspid stenosis. Aortic Valve: The aortic valve is tricuspid. There is mild calcification of the aortic valve. There is mild thickening of the aortic valve. Aortic valve regurgitation is not visualized. Aortic valve sclerosis is present, with no evidence of aortic valve stenosis. Pulmonic Valve: The pulmonic valve was normal in structure. Pulmonic valve regurgitation is trivial. No evidence of pulmonic stenosis. Aorta: The aortic root is normal in size and structure. Venous: The inferior vena cava is normal in size with greater than 50% respiratory variability, suggesting right atrial pressure of 3 mmHg. IAS/Shunts: No atrial level shunt detected by color flow Doppler.  LEFT VENTRICLE PLAX 2D LVIDd:         4.90 cm     Diastology LVIDs:         3.60 cm     LV e' medial:    6.09 cm/s LV PW:         1.10 cm     LV E/e' medial:  14.2 LV IVS:        1.10 cm     LV e' lateral:   9.90 cm/s LVOT diam:     2.10 cm     LV E/e' lateral: 8.7 LV SV:         65 LV SV Index:   27 LVOT Area:     3.46 cm  LV Volumes (MOD) LV vol d, MOD A2C: 85.1 ml LV vol s, MOD A2C: 44.9 ml LV SV MOD A2C:     40.2 ml RIGHT VENTRICLE RV Basal diam:  2.80 cm RV S prime:     12.40 cm/s TAPSE (M-mode): 1.9 cm LEFT ATRIUM  Index        RIGHT ATRIUM           Index LA diam:        3.60 cm 1.51 cm/m   RA Area:     12.70 cm LA Vol (A2C):   51.3 ml 21.53 ml/m  RA Volume:   28.20 ml  11.84 ml/m LA Vol (A4C):   46.2 ml 19.39 ml/m LA Biplane Vol: 49.7 ml 20.86 ml/m  AORTIC VALVE LVOT Vmax:   103.00 cm/s LVOT Vmean:  62.900 cm/s LVOT VTI:    0.187 m  AORTA Ao Root diam: 2.50 cm Ao Asc diam:  2.60 cm MITRAL VALVE MV Area (PHT): 3.91 cm    SHUNTS MV Decel Time: 194 msec    Systemic VTI:  0.19 m MV E velocity: 86.60  cm/s  Systemic Diam: 2.10 cm MV A velocity: 88.60 cm/s MV E/A ratio:  0.98 Candee Furbish MD Electronically signed by Candee Furbish MD Signature Date/Time: 07/01/2022/11:25:55 AM    Final    MR BRAIN WO CONTRAST  Result Date: 06/30/2022 EXAM: MRI HEAD WITHOUT CONTRAST TECHNIQUE: Multiplanar, multiecho pulse sequences of the brain and surrounding structures were obtained without intravenous contrast. COMPARISON:  Same day CT head. FINDINGS: Brain: Small acute infarct in the descending white matter tracks of the right basal ganglia and overlying frontal lobe. Mild edema without mass effect. No evidence of acute hemorrhage, mass lesion, midline shift or hydrocephalus. Vascular: Major arterial flow voids are maintained at the skull base. Skull and upper cervical spine: Normal marrow signal. Sinuses/Orbits: Clear sinuses.  No acute orbital findings. Other: Trace mastoid effusions. IMPRESSION: Small acute infarct in the descending white matter tracks of the right basal ganglia and overlying frontal lobe. Electronically Signed   By: Margaretha Sheffield M.D.   On: 06/30/2022 16:03   CT HEAD CODE STROKE WO CONTRAST  Result Date: 06/30/2022 CLINICAL DATA:  Left-sided numbness EXAM: CT HEAD WITHOUT CONTRAST TECHNIQUE: Contiguous axial images were obtained from the base of the skull through the vertex without intravenous contrast. RADIATION DOSE REDUCTION: This exam was performed according to the departmental dose-optimization program which includes automated exposure control, adjustment of the mA and/or kV according to patient size and/or use of iterative reconstruction technique. COMPARISON:  None Available. FINDINGS: Brain: There is hypodensity in the right lentiform nucleus/corona radiata which could reflect acute infarct. There is no other evidence of acute infarct. There is no acute intracranial hemorrhage or extra-axial fluid collection Parenchymal volume is normal. The ventricles are normal in size. There is a small  remote infarct in the left thalamus. There is no mass lesion. There is no mass effect or midline shift. ASPECTS [facility] Stroke Program Early CT Score) - Ganglionic level infarction (caudate, lentiform nuclei, internal capsule, insula, M1-M3 cortex): 6 - Supraganglionic infarction (M4-M6 cortex): 3 Total score (0-10 with 10 being normal): 9 Vascular: No definite dense vessel is seen. Skull: Normal. Negative for fracture or focal lesion. Sinuses/Orbits: There is mucosal thickening in the right maxillary sinus. The globes and orbits are unremarkable. Other: None. IMPRESSION: Suspect small acute infarct in the right basal ganglia. ASPECTS is 9. Findings communicated to Dr. Leonel Ramsay via Shea Evans at 2:15 pm. Electronically Signed   By: Valetta Mole M.D.   On: 06/30/2022 14:29     EKG: normal EKG, normal sinus rhythm.   Physical Examination: Temp:  [97.7 F (36.5 C)-99 F (37.2 C)] 98.2 F (36.8 C) (02/16 1136) Pulse Rate:  [82-98] 90 (02/16 1136) Resp:  [  11-26] 18 (02/16 1136) BP: (141-191)/(82-134) 168/82 (02/16 1136) SpO2:  [95 %-100 %] 98 % (02/16 1136) Weight:  GI:4022782 kg] 118 kg (02/15 1930)  General -obese middle-aged African-American male in no apparent distress.  Cardiovascular - Regular rate and rhythm.  Mental Status -  Level of arousal and orientation to time, place, and person were intact. Language including expression, naming, repetition, comprehension was assessed and found intact. Attention span and concentration were normal.   Cranial Nerves II - XII - II - Visual field intact OU. III, IV, VI - Extraocular movements intact. V - Facial sensation diminished on the left VII - Facial movement intact bilaterally. VIII - Hearing & vestibular intact bilaterally. X - Palate elevates symmetrically. XI -shoulder shrug weaker on the left XII - Tongue protrusion intact.  Motor Strength - The patient's strength was normal in right upper and lower extremities, 4- out of 5 strength to  left upper extremity with mild drift, 5 out of 5 strength to left lower extremity Motor Tone - Muscle tone was assessed at the neck and appendages and was normal.  Sensory - Light touch was assessed and found to be diminished in the left arm only  Coordination - The patient had normal movements in the hands and feet with no ataxia or dysmetria.  Tremor was absent  Gait and Station - deferred.   NIH stroke scale 2 .Premorbid modified Rankin score 0  Assessment:  [name] is a 47 y.o. male with history of hypertension and smoking presenting with left-sided sensory deficit, left arm weakness and left leg weakness and feet interfering with ambulation. He did not receive IV TNK due to presenting outside of window, with completed stroke on CT.  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    CTA head & neck LVO or hemodynamically significant stenosis MRI small acute infarct in right basal ganglia and overlying frontal lobe 2D Echo EF 55 to 60%, normal left atrial size, no atrial level shunt LDL 81 HgbA1c 5.9  Lovenox for VTE prophylaxis Diet Order             Diet regular Room service appropriate? Yes; Fluid consistency: Thin  Diet effective now                   No antithrombotic prior to admission, now on aspirin 81 mg daily and clopidogrel 75 mg daily for 3 weeks, followed by Plavix alone Therapy recommendations: Pending Disposition: Pending  Hypertension Stable Permissive hypertension (OK if < 220/120) but gradually normalize in 5-7 days Long-term BP goal normotensive  Hyperlipidemia Home meds: None LDL 81, goal < 70 Add atorvastatin 20 mg daily Continue statin at discharge   Other Stroke Risk Factors Cigarette smoker advised to stop smoking Obesity, Body mass index is 35.28 kg/m., recommend weight loss, diet and exercise as  appropriate  Cocaine use, advised to quit using  Other Active Problems None  Hospital day # 1   Thank you for this consultation and allowing [place] to participate in the care of this patient.  Leshara , MSN, AGACNP-BC [facility] See Amion for schedule and pager information 07/01/2022 1:14 PM   I have personally obtained history,examined this patient, reviewed notes, independently viewed imaging studies, participated in medical decision making and plan of care.ROS completed by me personally and pertinent positives fully documented  I have made any additions or clarifications directly to the above note. Agree with note above.  Patient presented with left hemiparesis secondary to right basal ganglia infarct likely due to small vessel disease as well as cocaine abuse.  Patient counseled to quit smoking cigarettes as well as using cocaine to make in the lifestyle changes.  Recommend aspirin and Plavix for 3 weeks followed by Plavix alone and aggressive risk factor modification.  Continue ongoing stroke workup.  Physical Occupational Therapy consults will likely need inpatient rehab.  Patient also appears to be at risk for obstructive sleep apnea and may benefit with consideration for possible participation in the sleep smart stroke prevention study.  He was given information to review and decide and expressed interest.  It was made clear to him that study participation is voluntary and he can withdraw at any time and he will get the same excellent standard of care wether he participates in the study or not.Long discussion with patient, his sister and fianc and answered questions.  Discussed with Dr. Thereasa Solo Greater than 50% time during this 50-minute visit was spent on counseling and coordination of care about his lacunar stroke and discussion about stroke evaluation, prevention and treatment and answering questions.  Antony Contras, MD Medical Director [facility] Stroke  Center Pager: [phone] 07/01/2022 1:44 PM  To contact Stroke Continuity provider, please refer to [email or web address] After hours, contact General Neurology

## 2022-07-01 NOTE — TOC Initial Note (Addendum)
Transition of Care [facility]) - Initial/Assessment Note    Patient Details  Name: Jonathan [name] MRN: [id number] Date of Birth: 06-08-1975  Transition of Care [facility]) CM/SW Contact:    Ninfa Meeker, RN Phone Number: 07/01/2022, 12:21 PM  Clinical Narrative:                 Transition of Care Screening Note:  Patient is 47 yr old male,from home with left leg and arm weakness. Being worked up for acute stroke.   Transition of Care [facility]) Department has reviewed patient and no TOC needs have been identified at this time. We will continue to monitor patient advancement through Interdisciplinary progressions and if new patient needs arise, please place a consult.         Patient Goals and CMS Choice            Expected Discharge Plan and Services                                              Prior Living Arrangements/Services                       Activities of Daily Living Home Assistive Devices/Equipment: None ADL Screening (condition at time of admission) Patient's cognitive ability adequate to safely complete daily activities?: Yes Is the patient deaf or have difficulty hearing?: No Does the patient have difficulty seeing, even when wearing glasses/contacts?: No Does the patient have difficulty concentrating, remembering, or making decisions?: No Patient able to express need for assistance with ADLs?: Yes Does the patient have difficulty dressing or bathing?: Yes Independently performs ADLs?: No Communication: Independent Dressing (OT): Needs assistance Is this a change from baseline?: Change from baseline, expected to last >3 days Grooming: Needs assistance Is this a change from baseline?: Change from baseline, expected to last >3 days Feeding: Needs assistance Is this a change from baseline?: Change from baseline, expected to last >3 days Bathing: Needs assistance Is this a change from baseline?: Change from baseline, expected to last >3  days Toileting: Needs assistance Is this a change from baseline?: Change from baseline, expected to last >3days In/Out Bed: Needs assistance Is this a change from baseline?: Change from baseline, expected to last >3 days Walks in Home: Needs assistance Is this a change from baseline?: Change from baseline, expected to last >3 days Does the patient have difficulty walking or climbing stairs?: Yes Weakness of Legs: Left Weakness of Arms/Hands: Left  Permission Sought/Granted                  Emotional Assessment              Admission diagnosis:  Acute ischemic stroke [facility]) [I63.9] Cerebrovascular accident (CVA), unspecified mechanism ([place]) [I63.9] Patient Active Problem List   Diagnosis Date Noted   Acute ischemic stroke ([place]) 06/30/2022   Noncompliance with medication regimen 06/30/2022   Essential hypertension 11/03/2021   Chest pain 12/21/2011   Tobacco abuse 12/21/2011   Elevated blood pressure 12/21/2011   Alcoholism ([place]) 12/21/2011   VERTIGO NOS OR DIZZINESS 07/13/2006   PCP:  Larina Earthly, MD Pharmacy:   [id number] [place]: [phone]: [phone]    Social Determinants of Health (SDOH) Social History:  SDOH Screenings   Food Insecurity: No Food Insecurity (06/30/2022)  Housing: Low Risk  (06/30/2022)  Transportation Needs: No Transportation Needs (06/30/2022)  Utilities: Not At Risk (06/30/2022)  Tobacco Use: High Risk (06/30/2022)   SDOH Interventions:     Readmission Risk Interventions     No data to display

## 2022-07-02 MED ORDER — BACLOFEN 10 MG PO TABS
5.0000 mg | ORAL_TABLET | Freq: Three times a day (TID) | ORAL | Status: DC | PRN
  Administered 2022-07-02 – 2022-07-03 (×2): 5 mg via ORAL
  Filled 2022-07-02 (×2): qty 1

## 2022-07-02 NOTE — Progress Notes (Signed)
STROKE TEAM PROGRESS NOTE   INTERVAL HISTORY Patient is seen in his room with 1 family member at the bedside.  His vital signs have been stable overnight, and his neurological exam is improving.  Patient states that he is motivated to try and exercise his left side.  Improved strength and mobility and dexterity seen on the left this morning.  He is participating in the sleep smart study.  Vitals:   07/01/22 2018 07/01/22 2321 07/02/22 0403 07/02/22 0958  BP: (!) 165/107 (!) 152/83 (!) 152/79 (!) 175/108  Pulse: 91 89 92 90  Resp: 18 19 18 18  $ Temp: 98.3 F (36.8 C) 98.5 F (36.9 C) 97.9 F (36.6 C) 98.6 F (37 C)  TempSrc: Oral Oral Oral Oral  SpO2: 100% 97% 97% 99%  Weight:      Height:       CBC:  Recent Labs  Lab 06/30/22 1335 06/30/22 1340 06/30/22 1624  WBC 6.4  --  6.9  NEUTROABS 4.3  --   --   HGB 14.7 14.3 14.5  HCT 43.2 42.0 42.4  MCV 89.6  --  88.9  PLT 270  --  99991111   Basic Metabolic Panel:  Recent Labs  Lab 06/30/22 1335 06/30/22 1340 06/30/22 1624  NA 141 142  --   K 3.6 3.7  --   CL 107 106  --   CO2 22  --   --   GLUCOSE 125* 125*  --   BUN 12 11  --   CREATININE 1.37* 1.30* 1.29*  CALCIUM 9.1  --   --    Lipid Panel:  Recent Labs  Lab 07/01/22 0728  CHOL 148  TRIG 161*  HDL 35*  CHOLHDL 4.2  VLDL 32  LDLCALC 81   HgbA1c:  Recent Labs  Lab 06/30/22 1625  HGBA1C 5.9*   Urine Drug Screen:  Recent Labs  Lab 06/30/22 1454  LABOPIA NONE DETECTED  COCAINSCRNUR POSITIVE*  LABBENZ NONE DETECTED  AMPHETMU NONE DETECTED  THCU NONE DETECTED  LABBARB NONE DETECTED    Alcohol Level  Recent Labs  Lab 06/30/22 1346  ETH <10    IMAGING past 24 hours No results found.  PHYSICAL EXAM General: Alert, well-nourished, well-developed patient in no acute distress Respiratory: Regular, unlabored respirations on room air  NEURO:  Mental Status: AA&Ox3  Speech/Language: speech is without dysarthria or aphasia.    Cranial Nerves:  II:  PERRL. Visual fields full.  III, IV, VI: EOMI. Eyelids elevate symmetrically.  V: Sensation is intact to light touch with tingling and odd feeling on left side of face.  VII: Smile is symmetrical.  VIII: hearing intact to voice. IX, X: Phonation is normal.  [id number] shrug weaker on the left XII: tongue is midline without fasciculations. Motor: 5/5 strength to right upper and lower extremities, 4 out of 5 strength to left upper extremity, 4+ out of 5 strength to left lower extremity Tone: is normal and bulk is normal Sensation- Intact to light touch bilaterally but diminished in left upper extremity Coordination: FTN intact on right, within limits of weakness on left Gait- deferred   ASSESSMENT/PLAN [name] is a 47 y.o. male with history of hypertension and smoking presenting with left-sided weakness and left-sided sensory deficit.  Acute infarct in right basal ganglia seen on initial CT.  Patient presented outside of window for TNK, and mechanical thrombectomy was not performed as he does not have an LVO.  Urine drug screen was positive  for cocaine, and patient has been counseled to stop cocaine use.  Stroke:  right basal ganglia infarction Etiology: Small vessel disease and cocaine use Code Stroke CT head No acute abnormality.  Acute infarct in right basal ganglia ASPECTS 10.    CTA head & neck no LVO or hemodynamically significant stenosis MRI acute infarct in right basal ganglia and overlying frontal lobe 2D Echo EF 55 to 60%, normal left atrial size, no atrial level shunt LDL 81 HgbA1c 5.9 VTE prophylaxis -Lovenox    Diet   Diet regular Room service appropriate? Yes; Fluid consistency: Thin   No antithrombotic prior to admission, now on aspirin 81 mg daily and clopidogrel 75 mg daily for 3 weeks followed by aspirin alone Therapy recommendations: CIR Disposition: Pending  Hypertension Home meds: None Stable Permissive hypertension (OK if < 220/120) but  gradually normalize in 5-7 days Long-term BP goal normotensive  Hyperlipidemia Home meds: None LDL 81, goal < 70 Add atorvastatin 20 mg daily High intensity statin not indicated as LDL close to goal Continue statin at discharge   Other Stroke Risk Factors Cigarette smoker advised to stop smoking Substance abuse - UDS:  THC NONE DETECTED, Cocaine POSITIVE. Patient advised to stop using due to stroke risk. Obesity, Body mass index is 35.28 kg/m., BMI >/= 30 associated with increased stroke risk, recommend weight loss, diet and exercise as appropriate  Obstructive sleep apnea  Other Active Problems None  Hospital day # [place] , MSN, AGACNP-BC [facility] See Amion for schedule and pager information 07/02/2022 2:37 PM    To contact Stroke Continuity provider, please refer to [email or web address] After hours, contact General Neurology

## 2022-07-02 NOTE — Progress Notes (Signed)
Jonathan [name]  [id number] DOB: 1975-09-28 DOA: 06/30/2022 PCP: Larina Earthly, MD    Brief Narrative:  47 year old with a history of tobacco abuse and HTN noncompliant with medical therapy who presented to the hospital with the acute onset of left arm and leg weakness to the point that he was unable to lift his left arm or grip with his left hand, or stand without the left leg giving way.  CT head revealed a small acute infarct in the right basal ganglia. His UDS was + for cocaine.   Consultants:  Stroke Team  Goals of Care:  Code Status: Full Code   DVT prophylaxis: Lovenox  Interim Hx: No acute events reported overnight.  Afebrile.  Vital signs stable.  Blood pressure modestly elevated with systolics 99991111 but this is within our target range.  We are presently awaiting a determination on possible CIR placement.  Assessment & Plan:  Acute right basal ganglia ischemic stroke -Felt to be related to small vessel disease with cocaine abuse -Resultant left arm and leg weakness with loss of grip strength in left hand -MRI brain noted a small acute infarct in the descending white matter tracts of the right basal ganglia and overlying frontal lobe -CTa head and neck notes no emergent vessel findings without significant stenosis appreciated -TTE revealed EF 55-60% with no WMA and normal diastolic function and no atrial level shunt detected -medical treatment: Aspirin plus Plavix x 21 days then Plavix alone -LDL 81 -A1c 5.9 -PT/OT/SLP - CIR suggested - patient passed his stroke swallow screen -Allow for permissive hypertension -counseled to stop smoking and using cocaine   HTN Allow permissive hypertension for now  HLD  In setting of acute CVA - lipitor added   Tobacco abuse Patient has been counseled as to the absolute need to discontinue tobacco abuse  Cocaine abuse UDS positive for cocaine -this is likely directly linked to his CVA -patient has been warned that  ongoing use of cocaine will likely lead to further CVAs, possible MIs, and possible death  Family Communication:  Disposition: From home -awaiting determination on possible inpatient rehab by the rehab team   Objective: Blood pressure (!) 152/79, pulse 92, temperature 97.9 F (36.6 C), temperature source Oral, resp. rate 18, height 6' (1.829 m), weight 118 kg, SpO2 97 %.  Intake/Output Summary (Last 24 hours) at 07/02/2022 0814 Last data filed at 07/01/2022 1500 Gross per 24 hour  Intake 720 ml  Output --  Net 720 ml   Filed Weights   06/30/22 1930  Weight: 118 kg    Examination: General: No acute respiratory distress Lungs: Clear to auscultation bilaterally without wheezes or crackles Cardiovascular: Regular rate and rhythm without murmur  Abdomen: Nontender, nondistended, soft, bowel sounds positive Extremities: No significant cyanosis, clubbing, or edema bilateral lower extremities  CBC: Recent Labs  Lab 06/30/22 1335 06/30/22 1340 06/30/22 1624  WBC 6.4  --  6.9  NEUTROABS 4.3  --   --   HGB 14.7 14.3 14.5  HCT 43.2 42.0 42.4  MCV 89.6  --  88.9  PLT 270  --  99991111    Basic Metabolic Panel: Recent Labs  Lab 06/30/22 1335 06/30/22 1340 06/30/22 1624  NA 141 142  --   K 3.6 3.7  --   CL 107 106  --   CO2 22  --   --   GLUCOSE 125* 125*  --   BUN 12 11  --   CREATININE 1.37* 1.30* 1.29*  CALCIUM 9.1  --   --     GFR: Estimated Creatinine Clearance: 94.9 mL/min (A) (by C-G formula based on SCr of 1.29 mg/dL (H)).   Scheduled Meds:  aspirin  81 mg Oral Daily   atorvastatin  20 mg Oral Daily   clopidogrel  75 mg Oral Daily   enoxaparin (LOVENOX) injection  40 mg Subcutaneous Q24H   nicotine  7 mg Transdermal Daily     LOS: 2 days   Cherene Altes, MD [facility] Office  [phone] Pager - Text Page per Shea Evans  If 7PM-7AM, please contact night-coverage per Amion 07/02/2022, 8:14 AM

## 2022-07-02 NOTE — Progress Notes (Addendum)
Physical Therapy Treatment Patient Details Name: TAG WOITAS MRN: [id number] DOB: 1976/01/17 Today's Date: 07/02/2022   History of Present Illness Jonathan [name] is a 47 y.o. male admitted on 06/30/22 for left arm weakness and left leg weakness. MRI: small acute infarct in the descending white matter tracks of the right basal ganglia and overlying frontal lobe. PHMX: PHMx: tobacco abuse of 1/2 pack/day, hypertension who takes his amlodipine only intermittently.    PT Comments    Pt is continuing to demonstrate L knee instability that places him at high risk for falls. He did demonstrate some improvement in his balance when using a cane. Pt had x3 LOB bouts ambulating on an even surface in an open environment and x2 LOB bouts navigating stairs, needing minA to recover his balance each time. Pt needs continual reminders to slow his gait, focus on his L knee stability during stance phase, and to sequence his feet when navigating stairs to maximize his safety. Pt is very interested in AIR to maximize his independence upon d/c. Pt could likely progress to a mod I level with intensive rehab. Will continue to follow acutely.      Recommendations for follow up therapy are one component of a multi-disciplinary discharge planning process, led by the attending physician.  Recommendations may be updated based on patient status, additional functional criteria and insurance authorization.  Follow Up Recommendations  Acute inpatient rehab (3hours/day)     Assistance Recommended at Discharge Intermittent Supervision/Assistance  Patient can return home with the following A little help with walking and/or transfers;Assistance with cooking/housework;Help with stairs or ramp for entrance;Assist for transportation   Equipment Recommendations  Cane    Recommendations for Other Services       Precautions / Restrictions Precautions Precautions: Fall Restrictions Weight Bearing Restrictions: No      Mobility  Bed Mobility Overal bed mobility: Modified Independent             General bed mobility comments: Poor control due to L sided deficits, but able to complete bed mobility without assistance    Transfers Overall transfer level: Needs assistance Equipment used: None Transfers: Sit to/from Stand Sit to Stand: Min assist           General transfer comment: Min assist for balance during transitions.  Fell back on the bed x 1    Ambulation/Gait Ambulation/Gait assistance: Herbalist (Feet): 180 Feet Assistive device: None, Straight cane Gait Pattern/deviations: Step-through pattern, Decreased dorsiflexion - left, Knee flexed in stance - left, Narrow base of support, Scissoring, Drifts right/left Gait velocity: decreased Gait velocity interpretation: 1.31 - 2.62 ft/sec, indicative of limited community ambulator   General Gait Details: Pt would demonstrate intermittent scissoring steps, a resulting effort to regain his balance when he would have a LOB. Pt needing minA to recover x3 LOB bouts ambulating part of the way without UE support and part of the way with a SPC in his R hand, demonstrating good coordination of the cane with his L leg. Pt needing cues to slow his gait to focus on L knee control due to noted knee flexion (almost buckling) during stance phase. Noted improved control with concentration.   Stairs Stairs: Yes Stairs assistance: Min assist Stair Management: Alternating pattern, Step to pattern, Forwards Number of Stairs: 10 (ascending x6 short stairs & x4 standard stairs, descending x6 short stairs & x4 standard stairs) General stair comments: Pt cued to lead up with his R leg and down with his L  with a step-to pattern for improved safety, but pt performing reciprocal steps ascending with mild instability noted. Pt did have to peform step-to pattern descending due to increased instability and x2 LOB needing minA to  recover.   Wheelchair Mobility    Modified Rankin (Stroke Patients Only) Modified Rankin (Stroke Patients Only) Pre-Morbid Rankin Score: No symptoms Modified Rankin: Moderately severe disability     Balance Overall balance assessment: Needs assistance Sitting-balance support: No upper extremity supported Sitting balance-Leahy Scale: Good     Standing balance support: No upper extremity supported Standing balance-Leahy Scale: Fair Standing balance comment: close supervision in static standing, up to minA to recover x5 LOB bouts this session                            Cognition Arousal/Alertness: Awake/alert Behavior During Therapy: WFL for tasks assessed/performed Overall Cognitive Status: Impaired/Different from baseline Area of Impairment: Safety/judgement                         Safety/Judgement: Decreased awareness of safety     General Comments: Pt with no significant cognitive deficits, but is a more risky than someone should be with left sided weakness. Fiance states he is "just stubborn".        Exercises Other Exercises Other Exercises: R foot placed anterior to L during x8 reps sit <> stand from EOB    General Comments        Pertinent Vitals/Pain Pain Assessment Pain Assessment: Faces Faces Pain Scale: No hurt Pain Intervention(s): Monitored during session    Home Living                          Prior Function            PT Goals (current goals can now be found in the care plan section) Acute Rehab PT Goals Patient Stated Goal: to go to AIR PT Goal Formulation: With patient/family Time For Goal Achievement: 07/16/22 Potential to Achieve Goals: Good Progress towards PT goals: Progressing toward goals    Frequency    Min 4X/week      PT Plan Equipment recommendations need to be updated    Co-evaluation              AM-PAC PT "6 Clicks" Mobility   Outcome Measure  Help needed turning from your  back to your side while in a flat bed without using bedrails?: None Help needed moving from lying on your back to sitting on the side of a flat bed without using bedrails?: None Help needed moving to and from a bed to a chair (including a wheelchair)?: A Little Help needed standing up from a chair using your arms (e.g., wheelchair or bedside chair)?: A Little Help needed to walk in hospital room?: A Little Help needed climbing 3-5 steps with a railing? : A Little 6 Click Score: 20    End of Session Equipment Utilized During Treatment: Gait belt Activity Tolerance: Patient tolerated treatment well Patient left: in bed;with call bell/phone within reach;with family/visitor present   PT Visit Diagnosis: Muscle weakness (generalized) (M62.81);Difficulty in walking, not elsewhere classified (R26.2);Hemiplegia and hemiparesis;Unsteadiness on feet (R26.81);Other abnormalities of gait and mobility (R26.89);Other symptoms and signs involving the nervous system (R29.898) Hemiplegia - Right/Left: Left Hemiplegia - dominant/non-dominant: Non-dominant Hemiplegia - caused by: Cerebral infarction     Time: 1215-1228 PT Time Calculation (min) (  ACUTE ONLY): 13 min  Charges:  $Gait Training: 8-22 mins                     Moishe Spice, PT, DPT Acute Rehabilitation Services  Office: [phone]    Orvan Falconer 07/02/2022, 3:26 PM

## 2022-07-02 NOTE — Progress Notes (Signed)
Inpatient Rehab Admissions Coordinator:    I met with pt. And fiance at bedside to discuss potential CIR. Admit. Pt  is interested, states that he may be alone for 12 hr stretches while fiance works. Physically, Pt. Likely to be able to progress to mod I level on CIR. I will request SLP assess cognition to see if this is reasonable from a cognitive perspective.   Clemens Catholic, [place], [place] Admissions Coordinator  [phone] ([place]) [phone] (office)

## 2022-07-03 MED ORDER — AMLODIPINE BESYLATE 10 MG PO TABS
10.0000 mg | ORAL_TABLET | Freq: Every day | ORAL | Status: DC
  Administered 2022-07-03 – 2022-07-04 (×2): 10 mg via ORAL
  Filled 2022-07-03 (×2): qty 1

## 2022-07-03 NOTE — Progress Notes (Signed)
Mobility Specialist: Progress Note   07/03/22 1455  Mobility  Activity Ambulated with assistance in hallway  Level of Assistance Contact guard assist, steadying assist  Assistive Device Cane  Distance Ambulated (ft) 150 ft  Activity Response Tolerated well  Mobility Referral Yes  $Mobility charge 1 Mobility   Pt received in the bed and agreeable to mobility. Mod I with bed mobility and contact guard during ambulation. Mild unsteadiness during ambulation but no overt LOB. Pt back to bed after session with call bell and phone in reach.   [place] Mobility Specialist Please contact via SecureChat or Rehab office at [phone]

## 2022-07-03 NOTE — Progress Notes (Signed)
Jonathan [name]  [id number] DOB: 12-Feb-1976 DOA: 06/30/2022 PCP: Larina Earthly, MD    Brief Narrative:  47 year old with a history of tobacco abuse and HTN noncompliant with medical therapy who presented to the hospital with the acute onset of left arm and leg weakness to the point that he was unable to lift his left arm or grip with his left hand, or stand without the left leg giving way.  CT head revealed a small acute infarct in the right basal ganglia. His UDS was + for cocaine.   Consultants:  Stroke Team  Goals of Care:  Code Status: Full Code   DVT prophylaxis: Lovenox  Interim Hx: Afebrile.  Vital signs stable.  No acute events recorded overnight.  Up working with mobility team at time of my evaluation.  No new complaints.  Is highly motivated to continue working with therapy.  Assessment & Plan:  Acute right basal ganglia ischemic stroke -Felt to be related to small vessel disease with cocaine abuse -Resultant left arm and leg weakness with loss of grip strength in left hand -MRI brain noted a small acute infarct in the descending white matter tracts of the right basal ganglia and overlying frontal lobe -CTa head and neck notes no emergent vessel findings without significant stenosis appreciated -TTE revealed EF 55-60% with no WMA and normal diastolic function and no atrial level shunt detected -medical treatment: Aspirin plus Plavix x 21 days then ASA alone is now the recommendation  -LDL 81 -A1c 5.9 -PT/OT/SLP - CIR suggested - patient passed his stroke swallow screen -Allow for permissive hypertension -counseled to stop smoking and using cocaine  -to f/u w/ the Stroke Clinic [facility] at [facility] in 4 weeks   HTN Now at the point in time that initiation of BP meds is appropriate -maximize Norvasc dose and follow trend  HLD  In setting of acute CVA - lipitor added   Tobacco abuse Patient has been counseled as to the absolute need to discontinue tobacco  abuse  Cocaine abuse UDS positive for cocaine -this is likely directly linked to his CVA -patient has been warned that ongoing use of cocaine will likely lead to further CVAs, possible MIs, and possible death  Family Communication:  Disposition: From home -awaiting determination on possible inpatient rehab by the rehab team   Objective: Blood pressure (!) 164/89, pulse 95, temperature 98 F (36.7 C), temperature source Oral, resp. rate 18, height 6' (1.829 m), weight 118 kg, SpO2 97 %. No intake or output data in the 24 hours ending 07/03/22 0916  Filed Weights   06/30/22 1930  Weight: 118 kg    Examination: General: No acute respiratory distress Lungs: Clear to auscultation bilaterally Cardiovascular: RRR without murmur Abdomen: Nontender, nondistended, soft, bowel sounds positive Extremities: No significant cyanosis, clubbing, or edema bilateral lower extremities  CBC: Recent Labs  Lab 06/30/22 1335 06/30/22 1340 06/30/22 1624  WBC 6.4  --  6.9  NEUTROABS 4.3  --   --   HGB 14.7 14.3 14.5  HCT 43.2 42.0 42.4  MCV 89.6  --  88.9  PLT 270  --  99991111    Basic Metabolic Panel: Recent Labs  Lab 06/30/22 1335 06/30/22 1340 06/30/22 1624  NA 141 142  --   K 3.6 3.7  --   CL 107 106  --   CO2 22  --   --   GLUCOSE 125* 125*  --   BUN 12 11  --   CREATININE 1.37*  1.30* 1.29*  CALCIUM 9.1  --   --     GFR: Estimated Creatinine Clearance: 94.9 mL/min (A) (by C-G formula based on SCr of 1.29 mg/dL (H)).   Scheduled Meds:  aspirin  81 mg Oral Daily   atorvastatin  20 mg Oral Daily   clopidogrel  75 mg Oral Daily   enoxaparin (LOVENOX) injection  40 mg Subcutaneous Q24H   nicotine  7 mg Transdermal Daily     LOS: 3 days   Cherene Altes, MD [facility] Office  [phone] Pager - Text Page per Shea Evans  If 7PM-7AM, please contact night-coverage per Amion 07/03/2022, 9:16 AM

## 2022-07-04 ENCOUNTER — Other Ambulatory Visit: Payer: Self-pay

## 2022-07-04 ENCOUNTER — Encounter (HOSPITAL_COMMUNITY): Payer: Self-pay | Admitting: Internal Medicine

## 2022-07-04 ENCOUNTER — Inpatient Hospital Stay (HOSPITAL_COMMUNITY)
Admission: RE | Admit: 2022-07-04 | Discharge: 2022-07-08 | DRG: 057 | Disposition: A | Discharge status: Home / Self Care | Payer: Medicaid Other | Source: Intra-hospital | Attending: Physical Medicine & Rehabilitation | Admitting: Physical Medicine & Rehabilitation

## 2022-07-04 ENCOUNTER — Encounter (HOSPITAL_COMMUNITY): Payer: Self-pay | Admitting: Physical Medicine & Rehabilitation

## 2022-07-04 DIAGNOSIS — I69354 Hemiplegia and hemiparesis following cerebral infarction affecting left non-dominant side: Secondary | ICD-10-CM

## 2022-07-04 DIAGNOSIS — Z79899 Other long term (current) drug therapy: Secondary | ICD-10-CM | POA: Diagnosis not present

## 2022-07-04 DIAGNOSIS — I69398 Other sequelae of cerebral infarction: Principal | ICD-10-CM

## 2022-07-04 DIAGNOSIS — N179 Acute kidney failure, unspecified: Secondary | ICD-10-CM | POA: Insufficient documentation

## 2022-07-04 DIAGNOSIS — R7301 Impaired fasting glucose: Secondary | ICD-10-CM | POA: Diagnosis present

## 2022-07-04 DIAGNOSIS — Z8249 Family history of ischemic heart disease and other diseases of the circulatory system: Secondary | ICD-10-CM

## 2022-07-04 DIAGNOSIS — F17210 Nicotine dependence, cigarettes, uncomplicated: Secondary | ICD-10-CM | POA: Diagnosis present

## 2022-07-04 DIAGNOSIS — F14188 Cocaine abuse with other cocaine-induced disorder: Secondary | ICD-10-CM | POA: Diagnosis present

## 2022-07-04 DIAGNOSIS — E785 Hyperlipidemia, unspecified: Secondary | ICD-10-CM | POA: Insufficient documentation

## 2022-07-04 DIAGNOSIS — M25362 Other instability, left knee: Secondary | ICD-10-CM | POA: Diagnosis present

## 2022-07-04 DIAGNOSIS — Z833 Family history of diabetes mellitus: Secondary | ICD-10-CM | POA: Diagnosis not present

## 2022-07-04 DIAGNOSIS — M545 Low back pain, unspecified: Secondary | ICD-10-CM | POA: Diagnosis present

## 2022-07-04 DIAGNOSIS — G479 Sleep disorder, unspecified: Secondary | ICD-10-CM | POA: Diagnosis present

## 2022-07-04 DIAGNOSIS — N189 Chronic kidney disease, unspecified: Secondary | ICD-10-CM | POA: Diagnosis present

## 2022-07-04 DIAGNOSIS — I129 Hypertensive chronic kidney disease with stage 1 through stage 4 chronic kidney disease, or unspecified chronic kidney disease: Secondary | ICD-10-CM | POA: Diagnosis present

## 2022-07-04 DIAGNOSIS — I639 Cerebral infarction, unspecified: Secondary | ICD-10-CM | POA: Diagnosis present

## 2022-07-04 DIAGNOSIS — I10 Essential (primary) hypertension: Secondary | ICD-10-CM | POA: Insufficient documentation

## 2022-07-04 LAB — CREATININE, SERUM
Creatinine, Ser: 1.58 mg/dL — ABNORMAL HIGH (ref 0.61–1.24)
GFR, Estimated: 54 mL/min — ABNORMAL LOW (ref >60)

## 2022-07-04 MED ORDER — BISACODYL 10 MG RE SUPP: 10.0000 mg | Freq: Every day | RECTAL | Status: DC | PRN

## 2022-07-04 MED ORDER — PROCHLORPERAZINE 25 MG RE SUPP: 12.5000 mg | Freq: Four times a day (QID) | RECTAL | Status: DC | PRN

## 2022-07-04 MED ORDER — ALUM & MAG HYDROXIDE-SIMETH 200-200-20 MG/5ML PO SUSP: 30.0000 mL | ORAL | Status: DC | PRN

## 2022-07-04 MED ORDER — ACETAMINOPHEN 325 MG PO TABS
325.0000 mg | ORAL_TABLET | ORAL | Status: DC | PRN
  Administered 2022-07-07: 650 mg via ORAL
  Filled 2022-07-04 (×4): qty 2

## 2022-07-04 MED ORDER — SENNOSIDES-DOCUSATE SODIUM 8.6-50 MG PO TABS: 1.0000 | ORAL_TABLET | Freq: Every evening | ORAL | Status: DC | PRN

## 2022-07-04 MED ORDER — AMLODIPINE BESYLATE 10 MG PO TABS
10.0000 mg | ORAL_TABLET | Freq: Every day | ORAL | Status: DC
  Administered 2022-07-05 – 2022-07-08 (×4): 10 mg via ORAL
  Filled 2022-07-04 (×4): qty 1

## 2022-07-04 MED ORDER — ENOXAPARIN SODIUM 40 MG/0.4ML IJ SOSY: 40.0000 mg | PREFILLED_SYRINGE | INTRAMUSCULAR | Status: DC

## 2022-07-04 MED ORDER — BLOOD PRESSURE CONTROL BOOK
Freq: Once | Status: AC
  Filled 2022-07-04: qty 1

## 2022-07-04 MED ORDER — PROCHLORPERAZINE MALEATE 5 MG PO TABS: 5.0000 mg | ORAL_TABLET | Freq: Four times a day (QID) | ORAL | Status: DC | PRN

## 2022-07-04 MED ORDER — TEMAZEPAM 15 MG PO CAPS: 15.0000 mg | ORAL_CAPSULE | Freq: Every evening | ORAL | Status: DC | PRN

## 2022-07-04 MED ORDER — GUAIFENESIN-DM 100-10 MG/5ML PO SYRP: 5.0000 mL | ORAL_SOLUTION | Freq: Four times a day (QID) | ORAL | Status: DC | PRN

## 2022-07-04 MED ORDER — ONDANSETRON HCL 4 MG/2ML IJ SOLN: 4.0000 mg | Freq: Four times a day (QID) | INTRAMUSCULAR | Status: DC | PRN

## 2022-07-04 MED ORDER — ENOXAPARIN SODIUM 40 MG/0.4ML IJ SOSY
40.0000 mg | PREFILLED_SYRINGE | INTRAMUSCULAR | Status: DC
  Administered 2022-07-04 – 2022-07-07 (×4): 40 mg via SUBCUTANEOUS
  Filled 2022-07-04 (×4): qty 0.4

## 2022-07-04 MED ORDER — HYDRALAZINE HCL 10 MG PO TABS: 10.0000 mg | ORAL_TABLET | Freq: Four times a day (QID) | ORAL | Status: DC | PRN

## 2022-07-04 MED ORDER — TRAZODONE HCL 50 MG PO TABS
25.0000 mg | ORAL_TABLET | Freq: Every evening | ORAL | Status: DC | PRN
  Administered 2022-07-04 – 2022-07-07 (×4): 50 mg via ORAL
  Filled 2022-07-04 (×4): qty 1

## 2022-07-04 MED ORDER — NICOTINE 7 MG/24HR TD PT24
7.0000 mg | MEDICATED_PATCH | Freq: Every day | TRANSDERMAL | Status: DC
  Administered 2022-07-05 – 2022-07-07 (×3): 7 mg via TRANSDERMAL
  Filled 2022-07-04 (×4): qty 1

## 2022-07-04 MED ORDER — FLEET ENEMA 7-19 GM/118ML RE ENEM: 1.0000 | ENEMA | Freq: Once | RECTAL | Status: DC | PRN

## 2022-07-04 MED ORDER — HYDROCHLOROTHIAZIDE 12.5 MG PO TABS
12.5000 mg | ORAL_TABLET | Freq: Every day | ORAL | Status: DC
  Administered 2022-07-04: 12.5 mg via ORAL
  Filled 2022-07-04: qty 1

## 2022-07-04 MED ORDER — IRBESARTAN 75 MG PO TABS
75.0000 mg | ORAL_TABLET | Freq: Every day | ORAL | Status: DC
  Administered 2022-07-05 – 2022-07-08 (×4): 75 mg via ORAL
  Filled 2022-07-04 (×4): qty 1

## 2022-07-04 MED ORDER — POLYETHYLENE GLYCOL 3350 17 G PO PACK: 17.0000 g | PACK | Freq: Every day | ORAL | Status: DC | PRN

## 2022-07-04 MED ORDER — IRBESARTAN 150 MG PO TABS
75.0000 mg | ORAL_TABLET | Freq: Every day | ORAL | Status: DC
  Administered 2022-07-04: 75 mg via ORAL
  Filled 2022-07-04: qty 1

## 2022-07-04 MED ORDER — HYDROCHLOROTHIAZIDE 12.5 MG PO TABS
12.5000 mg | ORAL_TABLET | Freq: Every day | ORAL | Status: DC
  Administered 2022-07-05 – 2022-07-06 (×2): 12.5 mg via ORAL
  Filled 2022-07-04 (×3): qty 1

## 2022-07-04 MED ORDER — ASPIRIN 81 MG PO CHEW
81.0000 mg | CHEWABLE_TABLET | Freq: Every day | ORAL | Status: DC
  Administered 2022-07-05 – 2022-07-08 (×4): 81 mg via ORAL
  Filled 2022-07-04 (×4): qty 1

## 2022-07-04 MED ORDER — ATORVASTATIN CALCIUM 10 MG PO TABS
20.0000 mg | ORAL_TABLET | Freq: Every day | ORAL | Status: DC
  Administered 2022-07-05 – 2022-07-08 (×4): 20 mg via ORAL
  Filled 2022-07-04 (×4): qty 2

## 2022-07-04 MED ORDER — PROCHLORPERAZINE EDISYLATE 10 MG/2ML IJ SOLN: 5.0000 mg | Freq: Four times a day (QID) | INTRAMUSCULAR | Status: DC | PRN

## 2022-07-04 MED ORDER — DIPHENHYDRAMINE HCL 12.5 MG/5ML PO ELIX: 12.5000 mg | ORAL_SOLUTION | Freq: Four times a day (QID) | ORAL | Status: DC | PRN

## 2022-07-04 MED ORDER — CLOPIDOGREL BISULFATE 75 MG PO TABS
75.0000 mg | ORAL_TABLET | Freq: Every day | ORAL | Status: DC
  Administered 2022-07-05 – 2022-07-08 (×4): 75 mg via ORAL
  Filled 2022-07-04 (×4): qty 1

## 2022-07-04 MED ORDER — BACLOFEN 5 MG HALF TABLET
5.0000 mg | ORAL_TABLET | Freq: Three times a day (TID) | ORAL | Status: DC | PRN
  Administered 2022-07-04 – 2022-07-08 (×6): 5 mg via ORAL
  Filled 2022-07-04 (×7): qty 1

## 2022-07-04 NOTE — Progress Notes (Signed)
Inpatient Rehab Admissions Coordinator:   Following for potential CIR admit. Insurance opened this [place], [place], [place] Admissions Coordinator  [phone] ([place]) [phone] (office)

## 2022-07-04 NOTE — Progress Notes (Signed)
Physical Therapy Treatment Patient Details Name: Jonathan [name] MRN: [id number] DOB: May 04, 1976 Today's Date: 07/04/2022   History of Present Illness Jonathan [name] is a 47 y.o. male admitted on 06/30/22 for left arm weakness and left leg weakness. MRI: small acute infarct in the descending white matter tracks of the right basal ganglia and overlying frontal lobe. PHMX: PHMx: tobacco abuse of 1/2 pack/day, hypertension who takes his amlodipine only intermittently.    PT Comments    Pt making excellent progress towards his physical therapy goals and remains motivated to participate. Session focused on gait training and therapeutic exercises for increased LLE neuromuscular control. Pt with great activity tolerance throughout. Continues to demonstrate L sided hemiplegia (LUE weaker than LLE), gait abnormalities, impaired standing balance. Would benefit from AIR to address deficits and maximize functional independence. Suspect pt can progress to modI level.    Recommendations for follow up therapy are one component of a multi-disciplinary discharge planning process, led by the attending physician.  Recommendations may be updated based on patient status, additional functional criteria and insurance authorization.  Follow Up Recommendations  Acute inpatient rehab (3hours/day)     Assistance Recommended at Discharge Intermittent Supervision/Assistance  Patient can return home with the following A little help with walking and/or transfers;Assistance with cooking/housework;Help with stairs or ramp for entrance;Assist for transportation   Equipment Recommendations  Cane    Recommendations for Other Services       Precautions / Restrictions Precautions Precautions: Fall Restrictions Weight Bearing Restrictions: No     Mobility  Bed Mobility               General bed mobility comments: up in recliner    Transfers Overall transfer level: Needs assistance Equipment used:  None Transfers: Sit to/from Stand Sit to Stand: Min guard           General transfer comment: min guard for balance    Ambulation/Gait Ambulation/Gait assistance: Min guard Gait Distance (Feet): 65 Feet (65", 65") Assistive device: None, Straight cane Gait Pattern/deviations: Step-through pattern, Decreased dorsiflexion - left, Knee flexed in stance - left, Decreased weight shift to left, Decreased stance time - left Gait velocity: decreased     General Gait Details: Min cues for L quad activation in midstance, heel strike, and neutral left foot positioning. Trialed gait with and without cane   Stairs Stairs: Yes Stairs assistance: Min guard Stair Management: Two rails Number of Stairs: 20 General stair comments: Forward and lateral step ups with min guard assist   Wheelchair Mobility    Modified Rankin (Stroke Patients Only) Modified Rankin (Stroke Patients Only) Pre-Morbid Rankin Score: No symptoms Modified Rankin: Moderately severe disability     Balance Overall balance assessment: Needs assistance Sitting-balance support: No upper extremity supported Sitting balance-Leahy Scale: Good     Standing balance support: No upper extremity supported Standing balance-Leahy Scale: Fair                              Cognition Arousal/Alertness: Awake/alert Behavior During Therapy: WFL for tasks assessed/performed Overall Cognitive Status: Impaired/Different from baseline Area of Impairment: Safety/judgement                         Safety/Judgement: Decreased awareness of safety              Exercises Other Exercises Other Exercises: Sit to stands x 3; LLE posteriorly to increase WB Other  Exercises: Bilateral hand support on railing: squats x 10 Other Exercises: Bilateral hand support on railings: x 10 forward step ups with LLE leading, x10 lateral step ups with LLE leading, x10 calf raises    General Comments        Pertinent  Vitals/Pain Pain Assessment Pain Assessment: No/denies pain    Home Living                          Prior Function            PT Goals (current goals can now be found in the care plan section) Acute Rehab PT Goals Potential to Achieve Goals: Good Progress towards PT goals: Progressing toward goals    Frequency    Min 3X/week      PT Plan Frequency needs to be updated    Co-evaluation              AM-PAC PT "6 Clicks" Mobility   Outcome Measure  Help needed turning from your back to your side while in a flat bed without using bedrails?: None Help needed moving from lying on your back to sitting on the side of a flat bed without using bedrails?: None Help needed moving to and from a bed to a chair (including a wheelchair)?: A Little Help needed standing up from a chair using your arms (e.g., wheelchair or bedside chair)?: A Little Help needed to walk in hospital room?: A Little Help needed climbing 3-5 steps with a railing? : A Little 6 Click Score: 20    End of Session Equipment Utilized During Treatment: Gait belt Activity Tolerance: Patient tolerated treatment well Patient left: in chair;with call bell/phone within reach;with family/visitor present Nurse Communication: Mobility status PT Visit Diagnosis: Muscle weakness (generalized) (M62.81);Difficulty in walking, not elsewhere classified (R26.2);Hemiplegia and hemiparesis;Unsteadiness on feet (R26.81);Other abnormalities of gait and mobility (R26.89);Other symptoms and signs involving the nervous system (R29.898) Hemiplegia - Right/Left: Left Hemiplegia - dominant/non-dominant: Non-dominant Hemiplegia - caused by: Cerebral infarction     Time: 1142-1201 PT Time Calculation (min) (ACUTE ONLY): 19 min  Charges:  $Gait Training: 8-22 mins                     Wyona Almas, PT, DPT Acute Rehabilitation Services Office [phone]    Deno Etienne 07/04/2022, 12:20 PM

## 2022-07-04 NOTE — Discharge Summary (Signed)
DISCHARGE SUMMARY  Jonathan [name]  MR#: [id number]  DOB:January 06, 1976  Date of Admission: 06/30/2022 Date of Discharge: 08-Jul-2022  Attending Physician:Munira Polson Hennie Duos, MD  Patient's [id number], Lavella Lemons, MD  Consults: Stroke Team   Disposition: D/C to CIR   Follow-up Appts:  Follow-up Information     [facility]. Schedule an appointment as soon as possible for a visit in 1 month(s).   Specialty: Neurology Why: stroke clinic Contact information: [address] [place] [phone]                Tests Needing Follow-up: -monitor BP as further titration of BP meds likely to be needed  -assure patient has a reliable PCP for f/u after d/c from CIR   Discharge Diagnoses: Acute right basal ganglia ischemic stroke HTN HLD  Tobacco abuse Cocaine abuse  Initial presentation: 47 year old with a history of tobacco abuse and HTN noncompliant with medical therapy who presented to the hospital with the acute onset of left arm and leg weakness to the point that he was unable to lift his left arm or grip with his left hand, or stand without the left leg giving way. CT head revealed a small acute infarct in the right basal ganglia. His UDS was + for cocaine.   Hospital Course:  Acute right basal ganglia ischemic stroke -Felt to be related to small vessel disease with cocaine abuse -Resultant left arm and leg weakness with loss of grip strength in left hand -MRI brain noted a small acute infarct in the descending white matter tracts of the right basal ganglia and overlying frontal lobe -CTa head and neck noted no emergent vessel findings without significant stenosis appreciated -TTE revealed EF 55-60% with no WMA and normal diastolic function and no atrial level shunt detected -medical treatment: Aspirin plus Plavix x 21 days then ASA alone is now the recommendation  -LDL 81 -A1c 5.9 -PT/OT/SLP - CIR  suggested - patient passed his stroke swallow screen -counseled to stop smoking and using cocaine  -to f/u w/ the Stroke Clinic NP at [facility] in 4 weeks    HTN Now at the point in time that initiation of BP meds is appropriate - Norvasc dose maximized - add ARB and HCTZ on day of d/c to CIR    HLD  In setting of acute CVA - lipitor added    Tobacco abuse Patient has been counseled as to the absolute need to discontinue tobacco abuse   Cocaine abuse UDS positive for cocaine - pt educated this is likely directly linked to his CVA - patient has been warned that ongoing use of cocaine will likely lead to further CVAs, possible MIs, and possible death    Allergies as of 07-08-22   No Known Allergies    MEDS AT TIME OF D/C to CIR   Current Facility-Administered Medications:    acetaminophen (TYLENOL) tablet 650 mg, 650 mg, Oral, Q6H PRN, Cherene Altes, MD, 650 mg at July 08, 2022 0750   amLODipine (NORVASC) tablet 10 mg, 10 mg, Oral, Daily, Cherene Altes, MD, 10 mg at Jul 08, 2022 [id number]   aspirin chewable tablet 81 mg, 81 mg, Oral, Daily, Hollice Gong, Mir M, MD, 81 mg at 2022-07-08 0752   atorvastatin (LIPITOR) tablet 20 mg, 20 mg, Oral, Daily, Hollice Gong, Mir M, MD, 20 mg at 07-08-2022 0752   baclofen (LIORESAL) tablet 5 mg, 5 mg, Oral, TID PRN, Cherene Altes, MD, 5 mg at 07/03/22 2023  clopidogrel (PLAVIX) tablet 75 mg, 75 mg, Oral, Daily, Hollice Gong, Mir M, MD, 75 mg at 07/04/22 0752   enoxaparin (LOVENOX) injection 40 mg, 40 mg, Subcutaneous, Q24H, Hollice Gong, Mir M, MD, 40 mg at 07/03/22 1758   hydrochlorothiazide (HYDRODIURIL) tablet 12.5 mg, 12.5 mg, Oral, Daily, Joette Catching T, MD, 12.5 mg at 07/04/22 1045   irbesartan (AVAPRO) tablet 75 mg, 75 mg, Oral, Daily, Cherene Altes, MD, 75 mg at 07/04/22 1045   nicotine (NICODERM CQ - dosed in mg/24 hr) patch 7 mg, 7 mg, Transdermal, Daily, Hollice Gong, Mir M, MD, 7 mg at 07/04/22 0752   ondansetron (ZOFRAN) injection 4 mg, 4 mg,  Intravenous, Q6H PRN, Hollice Gong, Mir M, MD   senna-docusate (Senokot-S) tablet 1 tablet, 1 tablet, Oral, QHS PRN, Hollice Gong, Mir M, MD   temazepam (RESTORIL) capsule 15 mg, 15 mg, Oral, QHS PRN, Hollice Gong, Mir M, MD, 15 mg at 07/03/22 2149  Day of Discharge BP (!) 171/101 (BP Location: Right Arm)   Pulse 95   Temp 98 F (36.7 C)   Resp 16   Ht 6' (1.829 m)   Wt 118 kg   SpO2 96%   BMI 35.28 kg/m   Physical Exam: General: No acute respiratory distress Lungs: Clear to auscultation bilaterally without wheezes or crackles Cardiovascular: Regular rate and rhythm without murmur gallop or rub normal S1 and S2 Abdomen: Nontender, nondistended, soft, bowel sounds positive, no rebound, no ascites, no appreciable mass Extremities: No significant cyanosis, clubbing, or edema bilateral lower extremities  Basic Metabolic Panel: Recent Labs  Lab 06/30/22 1335 06/30/22 1340 06/30/22 1624  NA 141 142  --   K 3.6 3.7  --   CL 107 106  --   CO2 22  --   --   GLUCOSE 125* 125*  --   BUN 12 11  --   CREATININE 1.37* 1.30* 1.29*  CALCIUM 9.1  --   --    CBC: Recent Labs  Lab 06/30/22 1335 06/30/22 1340 06/30/22 1624  WBC 6.4  --  6.9  NEUTROABS 4.3  --   --   HGB 14.7 14.3 14.5  HCT 43.2 42.0 42.4  MCV 89.6  --  88.9  PLT 270  --  262   Time spent in discharge (includes decision making & examination of pt): 35 minutes  07/04/2022, 2:52 PM   Cherene Altes, MD [facility] Office  [phone]

## 2022-07-04 NOTE — Progress Notes (Signed)
Occupational Therapy Treatment Patient Details Name: Jonathan [name] MRN: [id number] DOB: [birth year] Today's Date: 07/04/2022   History of present illness Jonathan [name] is a 47 y.o. male admitted on 06/30/22 for left arm weakness and left leg weakness. MRI: small acute infarct in the descending white matter tracks of the right basal ganglia and overlying frontal lobe. PHMX: PHMx: tobacco abuse of 1/2 pack/day, hypertension who takes his amlodipine only intermittently.   OT comments  Patient continues to make good gains. Patient able to walk to sink without an assistive device to perform grooming with difficulty using LUE to assist. Patient demonstrated posterior leaning and was able to correct without LOB. Patient able to doff sock but requires assistance to donn. Education on LUE HEP for elbow flexion and extension with level one therapy band, squeeze ball exercises, and pinch strength/fine motor with foam exercises. Patient would benefit from continue OT treatment to increase functional use of LUE to assist with bathing, dressing, and grooming tasks for safe discharge home. Acute OT to continue to follow.    Recommendations for follow up therapy are one component of a multi-disciplinary discharge planning process, led by the attending physician.  Recommendations may be updated based on patient status, additional functional criteria and insurance authorization.    Follow Up Recommendations  Acute inpatient rehab (3hours/day)     Assistance Recommended at Discharge Intermittent Supervision/Assistance  Patient can return home with the following  A little help with walking and/or transfers;A little help with bathing/dressing/bathroom;Help with stairs or ramp for entrance;Assist for transportation;Assistance with cooking/housework   Equipment Recommendations  Other (comment) (TBD next venue)    Recommendations for Other Services      Precautions / Restrictions  Precautions Precautions: Fall Restrictions Weight Bearing Restrictions: No       Mobility Bed Mobility Overal bed mobility: Modified Independent             General bed mobility comments: up in recliner    Transfers Overall transfer level: Needs assistance Equipment used: None Transfers: Sit to/from Stand Sit to Stand: Min guard           General transfer comment: min guard for balance     Balance Overall balance assessment: Needs assistance Sitting-balance support: No upper extremity supported Sitting balance-Leahy Scale: Good     Standing balance support: No upper extremity supported Standing balance-Leahy Scale: Fair Standing balance comment: min guard when standing at sink, posterior leaning at times but able to correct                           ADL either performed or assessed with clinical judgement   ADL Overall ADL's : Needs assistance/impaired     Grooming: Wash/dry hands;Wash/dry face;Min guard;Standing Grooming Details (indicate cue type and reason): for safety             Lower Body Dressing: Moderate assistance;Sitting/lateral leans Lower Body Dressing Details (indicate cue type and reason): able to doff socks without assistance and mod assist to donn due to assistance needed to get over toes               General ADL Comments: limited due to difficulty using LUE    Extremity/Trunk Assessment Upper Extremity Assessment LUE Deficits / Details: shoulder and composite finger flex/ext 2/5, rest 3/5 LUE Sensation: decreased light touch LUE Coordination: decreased fine motor;decreased gross motor            Vision  Perception     Praxis      Cognition Arousal/Alertness: Awake/alert Behavior During Therapy: WFL for tasks assessed/performed Overall Cognitive Status: Impaired/Different from baseline Area of Impairment: Safety/judgement                         Safety/Judgement: Decreased awareness  of safety              Exercises Exercises: General Upper Extremity, Other exercises General Exercises - Upper Extremity Elbow Flexion: Left, Seated, Theraband, 5 reps Theraband Level (Elbow Flexion): Level 1 (Yellow) Elbow Extension: Strengthening, Left, 5 reps, Seated, Theraband Theraband Level (Elbow Extension): Level 1 (Yellow) Other Exercises Other Exercises: squeeze ball exercises with Left hand Other Exercises: pinch exercises with foam for left hand    Shoulder Instructions       General Comments      Pertinent Vitals/ Pain       Pain Assessment Pain Assessment: No/denies pain Pain Intervention(s): Monitored during session  Home Living                                          Prior Functioning/Environment              Frequency  Min 2X/week        Progress Toward Goals  OT Goals(current goals can now be found in the care plan section)  Progress towards OT goals: Progressing toward goals  Acute Rehab OT Goals Patient Stated Goal: increase use of LUE OT Goal Formulation: With patient Time For Goal Achievement: 07/15/22 Potential to Achieve Goals: Good ADL Goals Pt Will Perform Grooming: with modified independence;standing Pt Will Perform Upper Body Bathing: with modified independence;sitting Pt Will Perform Lower Body Bathing: with modified independence;sit to/from stand;with adaptive equipment Pt Will Perform Upper Body Dressing: with modified independence;sitting Pt Will Perform Lower Body Dressing: with modified independence;with adaptive equipment;sit to/from stand Pt Will Transfer to Toilet: with modified independence;ambulating;grab bars Pt Will Perform Toileting - Clothing Manipulation and hygiene: with modified independence;sit to/from stand Pt/caregiver will Perform Home Exercise Program: Increased ROM;Increased strength;Left upper extremity;With written HEP provided;Independently  Plan Discharge plan remains appropriate     Co-evaluation                 AM-PAC OT "6 Clicks" Daily Activity     Outcome Measure   Help from another person eating meals?: None Help from another person taking care of personal grooming?: A Little Help from another person toileting, which includes using toliet, bedpan, or urinal?: A Little Help from another person bathing (including washing, rinsing, drying)?: A Little Help from another person to put on and taking off regular upper body clothing?: A Little Help from another person to put on and taking off regular lower body clothing?: A Lot 6 Click Score: 18    End of Session Equipment Utilized During Treatment: Gait belt  OT Visit Diagnosis: Unsteadiness on feet (R26.81);Other abnormalities of gait and mobility (R26.89);Muscle weakness (generalized) (M62.81);Other symptoms and signs involving cognitive function;Hemiplegia and hemiparesis Hemiplegia - Right/Left: Left Hemiplegia - dominant/non-dominant: Non-Dominant Hemiplegia - caused by: Cerebral infarction   Activity Tolerance Patient tolerated treatment well   Patient Left in chair;with call bell/phone within reach   Nurse Communication Mobility status        Time: 1030-1058 OT Time Calculation (min): 28 min  Charges: OT General Charges $OT Visit:  1 Visit OT Treatments $Self Care/Home Management : 8-22 mins $Neuromuscular Re-education: 8-22 mins  Lodema Hong, [place]  Office [phone]   Trixie Dredge 07/04/2022, 11:08 AM

## 2022-07-04 NOTE — H&P (Shared)
Physical Medicine and Rehabilitation Admission H&P    Chief Complaint  Patient presents with   Functional deficits due to stroke.     HPI:  Jonathan [name]. Koppel is a 47 year old male with history of HTN (but intermittent med use), tobacco use who was admitted on 06/30/22 with left arm and leg weakness which started the night before. CTA head/neck was negative for LVO. UDS positive for cocaine.  MRI brain showed small acute infarct in white matter tracks in right basal ganglia and overlying frontal lobe.  2D echo showed EF 55-60% with no wall abnormality and AV sclerosis. Dr. 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 He recommended DAPT X 3 weeks followed by Plavix alone as well as participation in sleep smart study given risk of sleep apnea. Therapy has been working with patient who continues to be limited by LLE weakness with knee instability, LUE weakness with sensory deficits and decreased in safety awareness. CIR recommended due to functional decline.     Review of Systems  Constitutional:  Negative for fever.  HENT:  Negative for hearing loss.   Eyes:  Positive for blurred vision (at times--sees spots when BP is high).  Respiratory:  Negative for cough and shortness of breath.   Cardiovascular:  Negative for chest pain and leg swelling.  Gastrointestinal:  Positive for heartburn (takes Pepto-Bismol). Negative for constipation.  Genitourinary:  Negative for dysuria and urgency.  Musculoskeletal:  Positive for back pain (occasionally due to prior back injury). Negative for myalgias.  Skin:  Negative for rash.  Neurological:  Positive for sensory change, focal weakness and headaches (at times due to BP elevation). Negative for dizziness.  Psychiatric/Behavioral:  The patient has insomnia (usually sleeps from 3/4 am to noon due to his job.).      Past Medical History:  Diagnosis Date   Hypertension    Lower back pain     History reviewed. No pertinent  surgical history.   Family History  Problem Relation Age of Onset   Cancer Mother    Diabetes Maternal Grandmother    Hypertension Other     Social History:  Lives with fiance. 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 He  reports that he has been smoking cigarettes. He has been smoking an average of 1 pack per day. He has never used smokeless tobacco. He reports current alcohol use--couple of beer or liqour few times a month.  He reports current drug use. Drug: Marijuana and cocaine occasionally. .   Allergies: No Known Allergies   Medications Prior to Admission  Medication Sig Dispense Refill   amLODipine (NORVASC) 5 MG tablet Take 1 tablet (5 mg total) by mouth daily. 30 tablet 3   ibuprofen (ADVIL) 800 MG tablet Take 1 tablet (800 mg total) by mouth every 8 (eight) hours as needed. (Patient taking differently: Take 800 mg by mouth every 8 (eight) hours as needed (for headaches).) 30 tablet 0   Multiple Vitamins-Minerals (MULTIVITAMIN MEN) TABS Take 1 tablet by mouth daily with breakfast.     tiZANidine (ZANAFLEX) 4 MG tablet Take 1 tablet (4 mg total) by mouth every 6 (six) hours as needed for muscle spasms. (Patient not taking: Reported on 06/30/2022) 30 tablet 0   traZODone (DESYREL) 50 MG tablet Take 0.5-2 tablets (25-100 mg total) by mouth at bedtime. (Patient not taking: Reported on 06/30/2022) 20 tablet 0      Home: Home Living Family/patient expects to be discharged to:: Private residence  Living Arrangements: Spouse/significant other, Children Available Help at Discharge: Family, Available 24 hours/day Type of Home: House Home Access: Stairs to enter [employer] of Steps: 4 Entrance Stairs-Rails: None Home Layout: One level Bathroom Shower/Tub: Chiropodist: Standard Home Equipment: None Additional Comments: fiance works night shift, 47 yo is home at night   Functional History: Prior Function Prior Level of Function : Independent/Modified  Independent, Driving Mobility Comments: 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 ADLs Comments: fiance does A him with his socks "I'm too fat"  Functional Status:  Mobility: Bed Mobility Overal bed mobility: Modified Independent General bed mobility comments: up in recliner Transfers Overall transfer level: Needs assistance Equipment used: None Transfers: Sit to/from Stand Sit to Stand: Min guard General transfer comment: min guard for balance Ambulation/Gait Ambulation/Gait assistance: Min guard Gait Distance (Feet): 65 Feet (65", 65") Assistive device: None, Straight cane Gait Pattern/deviations: Step-through pattern, Decreased dorsiflexion - left, Knee flexed in stance - left, Decreased weight shift to left, Decreased stance time - left General Gait Details: Min cues for L quad activation in midstance, heel strike, and neutral left foot positioning. Trialed gait with and without cane Gait velocity: decreased Gait velocity interpretation: 1.31 - 2.62 ft/sec, indicative of limited community ambulator Stairs: Yes Stairs assistance: Min guard Stair Management: Two rails Number of Stairs: 20 General stair comments: Forward and lateral step ups with min guard assist    ADL: ADL Overall ADL's : Needs assistance/impaired Eating/Feeding: Modified independent, Sitting Eating/Feeding Details (indicate cue type and reason): only using RUE Grooming: Wash/dry hands, Wash/dry face, Min guard, Standing Grooming Details (indicate cue type and reason): for safety Upper Body Bathing: Sitting, Minimal assistance Lower Body Bathing: Minimal assistance, Sit to/from stand Upper Body Dressing : Minimal assistance, Sitting Lower Body Dressing: Moderate assistance, Sitting/lateral leans Lower Body Dressing Details (indicate cue type and reason): able to doff socks without assistance and mod assist to donn due to assistance needed to get over toes Toilet Transfer: Minimal assistance,  Ambulation Toilet Transfer Details (indicate cue type and reason): simulated bed>door>back to bed Toileting- Clothing Manipulation and Hygiene: Minimal assistance, Sit to/from stand General ADL Comments: limited due to difficulty using LUE  Cognition: Cognition Overall Cognitive Status: Impaired/Different from baseline Orientation Level: Oriented X4 Cognition Arousal/Alertness: Awake/alert Behavior During Therapy: WFL for tasks assessed/performed Overall Cognitive Status: Impaired/Different from baseline Area of Impairment: Safety/judgement Safety/Judgement: Decreased awareness of safety General Comments: Pt with no significant cognitive deficits, but is a more risky than someone should be with left sided weakness. Fiance states he is "just stubborn".  Physical Exam: Blood pressure (!) 171/101, pulse 95, temperature 98 F (36.7 C), resp. rate 16, height 6' (1.829 m), weight 118 kg, SpO2 96 %. Physical Exam Vitals and nursing note reviewed.  Constitutional:      Appearance: Normal appearance.  Pulmonary:     Effort: Pulmonary effort is normal.     Breath sounds: Normal breath sounds.  Musculoskeletal:     Cervical back: Normal range of motion.  Neurological:     Mental Status: He is alert and oriented to person, place, and time.     Sensory: Sensory deficit present.     Comments: Speech clear. Able to follow simple  step commands. Sitting at EOB with good balance. Keft hemiplegia with sensory deficits LUE/LLE.      No results found for this or any previous visit (from the past 48 hour(s)). No results found.    Blood pressure (!) 171/101,  pulse 95, temperature 98 F (36.7 C), resp. rate 16, height 6' (1.829 m), weight 118 kg, SpO2 96 %.  Medical Problem List and Plan: 1. Functional deficits secondary to ***  -patient may *** shower  -ELOS/Goals: *** 2.  Antithrombotics: -DVT/anticoagulation:  Pharmaceutical: Lovenox  -antiplatelet therapy: DAPT X 3 weeks followed by  Plavix alone starting 03/08 3. Pain Management: Tylenol prn. Baclofen TID prn for issues with back pain.  4. Mood/Behavior/Sleep:    -antipsychotic agents: N/A  --Restoril prn for sleep.  5. Neuropsych/cognition: This patient is capable of making decisions on his own behalf. 6. Skin/Wound Care: Routine pressure relief measures.  7. Fluids/Electrolytes/Nutrition: Monitor I/O. Check CMET in am.  8. HTN: Does not have PCP/used meds prn. Will monitor BP TID --Continue  Avapro and HCTZ (were added 02/19). Amlodipine resumed on 02/18.  --Avoid hypoperfusion. Titrate as indicated.  9. Elevated Lipids: Trig-161. HDL-35-->Now on Lipitor. 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 Restoril prn effective 11. Tobacco use: Continue nicotine patch.  13.  Impaired fasting blood sugar: Hgb A1C- 5.9.   14.CKD?: SCr 1.37 at admission Recheck CMET in am.    ***  Bary Leriche, PA-C 07/04/2022

## 2022-07-04 NOTE — TOC Initial Note (Signed)
Transition of Care [facility]) - Initial/Assessment Note    Patient Details  Name: Jonathan [name] MRN: [id number] Date of Birth: August 05, 1975  [facility]) CM/SW Contact:    Pollie Friar, RN Phone Number: 07/04/2022, 2:14 PM  Clinical Narrative:                 Pt is from home with fiance. CIR has started insurance auth for rehab stay.  TOC following.  Expected Discharge Plan: IP Rehab Facility Barriers to Discharge: Continued Medical Work up   Patient Goals and CMS Choice   CMS Medicare.gov Compare Post Acute Care list provided to:: Patient Choice offered to / list presented to : Patient      Expected Discharge Plan and Services   Discharge Planning Services: CM Consult Post Acute Care Choice: IP Rehab Living arrangements for the past 2 months: Single Family Home                                      Prior Living Arrangements/Services Living arrangements for the past 2 months: Single Family Home Lives with:: Spouse                   Activities of Daily Living Home Assistive Devices/Equipment: None ADL Screening (condition at time of admission) Patient's cognitive ability adequate to safely complete daily activities?: Yes Is the patient deaf or have difficulty hearing?: No Does the patient have difficulty seeing, even when wearing glasses/contacts?: No Does the patient have difficulty concentrating, remembering, or making decisions?: No Patient able to express need for assistance with ADLs?: Yes Does the patient have difficulty dressing or bathing?: Yes Independently performs ADLs?: No Communication: Independent Dressing (OT): Needs assistance Is this a change from baseline?: Change from baseline, expected to last >3 days Grooming: Needs assistance Is this a change from baseline?: Change from baseline, expected to last >3 days Feeding: Needs assistance Is this a change from baseline?: Change from baseline, expected to last >3  days Bathing: Needs assistance Is this a change from baseline?: Change from baseline, expected to last >3 days Toileting: Needs assistance Is this a change from baseline?: Change from baseline, expected to last >3days In/Out Bed: Needs assistance Is this a change from baseline?: Change from baseline, expected to last >3 days Walks in Home: Needs assistance Is this a change from baseline?: Change from baseline, expected to last >3 days Does the patient have difficulty walking or climbing stairs?: Yes Weakness of Legs: Left Weakness of Arms/Hands: Left  Permission Sought/Granted                  Emotional Assessment              Admission diagnosis:  Acute ischemic stroke [facility]) [I63.9] Cerebrovascular accident (CVA), unspecified mechanism ([place]) [I63.9] Patient Active Problem List   Diagnosis Date Noted   Acute ischemic stroke ([place]) 06/30/2022   Noncompliance with medication regimen 06/30/2022   Essential hypertension 11/03/2021   Chest pain 12/21/2011   Tobacco abuse 12/21/2011   Elevated blood pressure 12/21/2011   Alcoholism ([place]) 12/21/2011   VERTIGO NOS OR DIZZINESS 07/13/2006   PCP:  Larina Earthly, MD Pharmacy:   [id number] [place], [id number]: [phone]: [phone]    Social Determinants of Health (SDOH) Social History: SDOH  Screenings   Food Insecurity: No Food Insecurity (06/30/2022)  Housing: Low Risk  (06/30/2022)  Transportation Needs: No Transportation Needs (06/30/2022)  Utilities: Not At Risk (06/30/2022)  Tobacco Use: High Risk (06/30/2022)   SDOH Interventions:     Readmission Risk Interventions     No data to display

## 2022-07-04 NOTE — Progress Notes (Signed)
Patient ID: CANDICE MISNER, male   DOB: [birth year], 47 y.o.   MRN: [id number] Met with the patient and significant other to review current situation, rehab process, team conference and plan of care. Discussed secondary risks including HTN, HLD, prediabetes and smoking. Reviewed medications including DAPT x 3 weeks then Plavix solo, and dietary modification recommendations with smoking cessation. Continue to follow along to address educational needs to facilitate preparation for discharge. Margarito Liner

## 2022-07-04 NOTE — Progress Notes (Signed)
Inpatient Rehabilitation Admission Medication Review by a Pharmacist  A complete drug regimen review was completed for this patient to identify any potential clinically significant medication issues.  High Risk Drug Classes Is patient taking? Indication by Medication  Antipsychotic Yes Compazine - prn N/V  Anticoagulant Yes Lovenox - VTE ppx  Antibiotic No   Opioid No   Antiplatelet Yes Aspirin/clopidogrel x 21 days then aspirin alone - CVA  Hypoglycemics/insulin No   Vasoactive Medication Yes Amlodipine, Irbesartan, HCTZ - BP  Chemotherapy No   Other Yes Atorvastatin - HLD Methocarbamol - prn spasms Trazodone - prn sleep     Type of Medication Issue Identified Description of Issue Recommendation(s)  Drug Interaction(s) (clinically significant)     Duplicate Therapy     Allergy     No Medication Administration End Date     Incorrect Dose     Additional Drug Therapy Needed     Significant med changes from prior encounter (inform family/care partners about these prior to discharge).    Other       Clinically significant medication issues were identified that warrant physician communication and completion of prescribed/recommended actions by midnight of the next day:  No  Pharmacist comments: None  Time spent performing this drug regimen review (minutes):  20 minutes  Thank you Anette Guarneri, PharmD

## 2022-07-04 NOTE — Progress Notes (Addendum)
Patient ID: Jonathan [name], male   DOB: 1975/09/24, 47 y.o.   MRN: [id number]  Pt arrived to 4M12 with visitor at bedside. Pt oriented to rehab and policies reviewed. Pt impulsive. Pt educated on safety precautions. Pt educated on importance of bed alarm and staff assistance. Pt hesitant about safety precautions. Bed alarm on, bed in lowest position, non skid socks on, and call light in reach. Pt reminded to call for assistance. Vitals obtained, assessment complete. Sheela Stack, LPN

## 2022-07-04 NOTE — Progress Notes (Signed)
PMR Admission Coordinator Pre-Admission Assessment   Patient: Jonathan [name] is an 47 y.o., male MRN: [id number] DOB: 1976/04/02 Height: 6' (182.9 cm) Weight: 118 kg   Insurance Information HMO: yes    PPO:      PCP:      IPA:      80/20:      OTHER:  PRIMARY: [employer]       Policy#: [id number]      Subscriber: pt.  CM Name: Oscar La fax: [phone]      Phone#: [phone] x [phone]     UM with [employer] called 2/19 with approval for admit on 2/19 for 7 days with updates due [id number] Pre-Cert#: [id number]      Employer:  Eff Date: 05/16/2022- 05/16/2023 Deductible: does not have one  OOP Max: $2,400 ($0 met)  CIR: 90% coverage, 10% co-insurance  SNF: 90% coverage, 10% co-insurance; limited to 27 days/cal yr (24 remaining)  Outpatient: 90% coverage, 10% co-insurance; limited to 30 combined visits/cal yr (30 remaining)  Home Health:  90% coverage, 10% co-insurance  DME: 90% coverage, 10% co-insurance  Providers: in network  SECONDARY:       Policy#:      Phone#:      Development worker, community:       Phone#:    The Engineer, [employer]" for patients in Inpatient Rehabilitation Facilities with attached "Privacy Act [place] Records" was provided and verbally reviewed with: Patient   Emergency Contact Information Contact Information       Name Relation Home Work Mobile    [place] Sister     [phone]           Current Medical History  Patient Admitting Diagnosis: CVA History of Present Illness: Jonathan [name] is a 47 y.o. male with medical history significant for ongoing tobacco abuse of 1/2 pack/day, hypertension who takes his amlodipine only intermittently who presented to the [facility] ED 06/30/22 or left arm weakness and left leg weakness and  suspected acute stroke.In the ED, he was noted to have an initial blood pressure of 191/116.  Stat noncontrast head CT shows small acute infarct in the right basal ganglia.   Patient was given  aspirin and Plavix, seen by teleneurologist Dr. Quinn Axe.  MRI brain noted a small acute infarct in the descending white matter tracts of the right basal ganglia and overlying frontal lobe CTa head and neck notes no emergent vessel findings without significant stenosis appreciated. TTE revealed EF 55-60% with no WMA and normal diastolic function and no atrial level shunt detected. Neurology recommends  Aspirin plus Plavix x 21 days then Plavix alone. Pt. Seen by PT/OT and they recommend CIR to assist return to PLOF.  Complete NIHSS TOTAL: 3   Patient's medical record from [facility] has been reviewed by the rehabilitation admission coordinator and physician.   Past Medical History      Past Medical History:  Diagnosis Date   Hypertension     Lower back pain        Has the patient had major surgery during 100 days prior to admission? No   Family History   family history includes Cancer in his mother; Hypertension in an other family member.   Current Medications   Current Facility-Administered Medications:    acetaminophen (TYLENOL) tablet 650 mg, 650 mg, Oral, Q6H PRN, Cherene Altes, MD, 650 mg at 07/04/22 0750   amLODipine (NORVASC) tablet 10 mg, 10 mg, Oral, Daily, Cherene Altes,  MD, 10 mg at 07/04/22 [id number]   aspirin chewable tablet 81 mg, 81 mg, Oral, Daily, Hollice Gong, Mir M, MD, 81 mg at 07/04/22 [id number]   atorvastatin (LIPITOR) tablet 20 mg, 20 mg, Oral, Daily, Hollice Gong, Mir M, MD, 20 mg at 07/04/22 [id number]   baclofen (LIORESAL) tablet 5 mg, 5 mg, Oral, TID PRN, Cherene Altes, MD, 5 mg at 07/03/22 2023   clopidogrel (PLAVIX) tablet 75 mg, 75 mg, Oral, Daily, Hollice Gong, Mir M, MD, 75 mg at 07/04/22 0752   enoxaparin (LOVENOX) injection 40 mg, 40 mg, Subcutaneous, Q24H, Hollice Gong, Mir M, MD, 40 mg at 07/03/22 1758   hydrochlorothiazide (HYDRODIURIL) tablet 12.5 mg, 12.5 mg, Oral, Daily, Joette Catching T, MD, 12.5 mg at 07/04/22 1045   irbesartan (AVAPRO)  tablet 75 mg, 75 mg, Oral, Daily, Joette Catching T, MD, 75 mg at 07/04/22 1045   nicotine (NICODERM CQ - dosed in mg/24 hr) patch 7 mg, 7 mg, Transdermal, Daily, Hollice Gong, Mir M, MD, 7 mg at 07/04/22 0752   ondansetron (ZOFRAN) injection 4 mg, 4 mg, Intravenous, Q6H PRN, Hollice Gong, Mir M, MD   senna-docusate (Senokot-S) tablet 1 tablet, 1 tablet, Oral, QHS PRN, Hollice Gong, Mir M, MD   temazepam (RESTORIL) capsule 15 mg, 15 mg, Oral, QHS PRN, Hollice Gong, Mir M, MD, 15 mg at 07/03/22 2149   Patients Current Diet:  Diet Order                  Diet regular Room service appropriate? Yes; Fluid consistency: Thin  Diet effective now                         Precautions / Restrictions Precautions Precautions: Fall Restrictions Weight Bearing Restrictions: No    Has the patient had 2 or more falls or a fall with injury in the past year? 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 Independent   Indoor Mobility: Did the patient need assistance with walking from room to room (with or without device)? Independent   Stairs: Did the patient need assistance with internal or external stairs (with or without device)? Independent   Functional Cognition: Did the patient need help planning regular tasks such as shopping or remembering to take medications? Independent   Patient Information Are you of Hispanic, Latino/a,or Spanish origin?: A. No, not of Hispanic, Latino/a, or Spanish origin What is your race?: B. Black or African American Do you need or want an interpreter to communicate with a doctor or health care staff?: 0. No   Patient's Response To:  Health Literacy and Transportation Is the patient able to respond to health literacy and transportation needs?: Yes Health Literacy - How often do you need to have someone help you when you read  instructions, pamphlets, or other written material from your doctor or pharmacy?: Never In the past 12 months, has lack of transportation kept you from medical appointments or from getting medications?: No In the past 12 months, has lack of transportation kept you from meetings, work, or from getting things needed for daily living?: No   Development worker, international aid / [place] Devices/Equipment: None Home Equipment: None   Prior Device Use: Indicate devices/aids used by the patient prior to current illness, exacerbation or injury? None of the above   Current Functional Level Cognition   Overall Cognitive Status:  Impaired/Different from baseline Orientation Level: Oriented X4 Safety/Judgement: Decreased awareness of safety General Comments: Pt with no significant cognitive deficits, but is a more risky than someone should be with left sided weakness. Fiance states he is "just stubborn".    Extremity Assessment (includes Sensation/Coordination)   Upper Extremity Assessment: Defer to OT evaluation LUE Deficits / Details: shoulder and composite finger flex/ext 2/5, rest 3/5 LUE Sensation: decreased light touch LUE Coordination: decreased fine motor, decreased gross motor  Lower Extremity Assessment: LLE deficits/detail LLE Deficits / Details: left leg is 3+/5 throughout with deminished LT sensation LLE Sensation: decreased light touch     ADLs   Overall ADL's : Needs assistance/impaired Eating/Feeding: Modified independent, Sitting Eating/Feeding Details (indicate cue type and reason): only using RUE Grooming: Wash/dry hands, Wash/dry face, Min guard, Standing Grooming Details (indicate cue type and reason): for safety Upper Body Bathing: Sitting, Minimal assistance Lower Body Bathing: Minimal assistance, Sit to/from stand Upper Body Dressing : Minimal assistance, Sitting Lower Body Dressing: Moderate assistance, Sitting/lateral leans Lower Body Dressing Details (indicate  cue type and reason): able to doff socks without assistance and mod assist to donn due to assistance needed to get over toes Toilet Transfer: Minimal assistance, Ambulation Toilet Transfer Details (indicate cue type and reason): simulated bed>door>back to bed Toileting- Clothing Manipulation and Hygiene: Minimal assistance, Sit to/from stand General ADL Comments: limited due to difficulty using LUE     Mobility   Overal bed mobility: Modified Independent General bed mobility comments: up in recliner     Transfers   Overall transfer level: Needs assistance Equipment used: None Transfers: Sit to/from Stand Sit to Stand: Min guard General transfer comment: min guard for balance     Ambulation / Gait / Stairs / Wheelchair Mobility   Ambulation/Gait Ambulation/Gait assistance: Counsellor (Feet): 65 Feet (65", 65") Assistive device: None, Straight cane Gait Pattern/deviations: Step-through pattern, Decreased dorsiflexion - left, Knee flexed in stance - left, Decreased weight shift to left, Decreased stance time - left General Gait Details: Min cues for L quad activation in midstance, heel strike, and neutral left foot positioning. Trialed gait with and without cane Gait velocity: decreased Gait velocity interpretation: 1.31 - 2.62 ft/sec, indicative of limited community ambulator Stairs: Yes Stairs assistance: Min guard Stair Management: Two rails Number of Stairs: 20 General stair comments: Forward and lateral step ups with min guard assist     Posture / Balance Balance Overall balance assessment: Needs assistance Sitting-balance support: No upper extremity supported Sitting balance-Leahy Scale: Good Standing balance support: No upper extremity supported Standing balance-Leahy Scale: Fair Standing balance comment: min guard when standing at sink, posterior leaning at times but able to correct     Special needs/care consideration Special service needs none    Previous  Home Environment (from acute therapy documentation) Living Arrangements: Spouse/significant other, Children Available Help at Discharge: Family, Available 24 hours/day Type of Home: House Home Layout: One level Home Access: Stairs to enter Entrance Stairs-Rails: None Entrance Stairs-Number of Steps: 4 Bathroom Shower/Tub: Chiropodist: Standard Home Care Services: No Additional Comments: fiance works night shift, 47 yo is home at night   Discharge Living Setting Plans for Discharge Living Setting: Patient's home Type of Home at Discharge: House Discharge Home Layout: One level Discharge Home Access: Stairs to enter Entrance Stairs-Rails: None Entrance Stairs-Number of Steps: 4 Discharge Bathroom Shower/Tub: [place] unit Discharge Bathroom Toilet: Standard Discharge Bathroom Accessibility: Yes How Accessible: Accessible via walker Does the patient have any  problems obtaining your medications?: No   Social/Family/Support Systems Patient Roles: Partner Contact Information: Tia (fiance) Ability/Limitations of Caregiver: Can provide Min A if needed, but she works 12 hour shifts, pt. to d/c home with mod I goals Caregiver Availability: Intermittent Discharge Plan Discussed with Primary Caregiver: No Is Caregiver In Agreement with Plan?: No Does Caregiver/Family have Issues with Lodging/Transportation while Pt is in Rehab?: No   Goals Patient/Family Goal for Rehab: PT/OT/SLP Mod I Expected length of stay: 5-7 days Pt/Family Agrees to Admission and willing to participate: Yes Program Orientation Provided & Reviewed with Pt/Caregiver Including Roles  & Responsibilities: Yes   Decrease burden of Care through IP rehab admission: n/a   Possible need for SNF placement upon discharge: not anticipated   Patient Condition: I have reviewed medical records from [facility], spoken with CM, and patient and daughter. I met with patient at the bedside  for inpatient rehabilitation assessment.  Patient will benefit from ongoing PT, OT, and SLP, can actively participate in 3 hours of therapy a day 5 days of the week, and can make measurable gains during the admission.  Patient will also benefit from the coordinated team approach during an Inpatient Acute Rehabilitation admission.  The patient will receive intensive therapy as well as Rehabilitation physician, nursing, social worker, and care management interventions.  Due to safety, skin/wound care, disease management, medication administration, pain management, and patient education the patient requires 24 hour a day rehabilitation nursing.  The patient is currently Min  with mobility and basic ADLs.  Discharge setting and therapy post discharge at home with home health is anticipated.  Patient has agreed to participate in the Acute Inpatient Rehabilitation Program and will admit today.   Preadmission Screen Completed By:  Genella Mech, 07/04/2022 2:28 PM ______________________________________________________________________   Discussed status with Dr. Aretta Nip  on 07/04/22  at 43 and received approval for admission today.   Admission Coordinator:  Genella Mech, CCC-SLP, time 1438/Date 07/04/22    Assessment/Plan: Diagnosis:  Right Basal ganglia infarct  Does the need for close, 24 hr/day Medical supervision in concert with the patient's rehab needs make it unreasonable for this patient to be served in a less intensive setting? Yes Co-Morbidities requiring supervision/potential complications: HTN, Cocaine and tobacco abuse Due to bladder management, bowel management, safety, skin/wound care, disease management, medication administration, pain management, and patient education, does the patient require 24 hr/day rehab nursing? Yes Does the patient require coordinated care of a physician, rehab nurse, PT, OT, and SLP to address physical and functional deficits in the context of the above medical  diagnosis(es)? Yes Addressing deficits in the following areas: balance, endurance, locomotion, strength, transferring, bathing, dressing, feeding, grooming, toileting, and psychosocial support Can the patient actively participate in an intensive therapy program of at least 3 hrs of therapy 5 days a week? Yes The potential for patient to make measurable gains while on inpatient rehab is excellent Anticipated functional outcomes upon discharge from inpatient rehab: modified independent PT, modified independent OT, n/a SLP Estimated rehab length of stay to reach the above functional goals is: 5-7d Anticipated discharge destination: Home 10. Overall Rehab/Functional Prognosis: excellent     MD Signature: Charlett Blake M.D. [facility] Fellow Am Acad of Phys Med and Rehab Diplomate Am Board of Electrodiagnostic Med Fellow Am Board of Interventional Pain

## 2022-07-04 NOTE — Progress Notes (Signed)
Inpatient Rehab Admissions Coordinator:    I have a CIR bed for this Pt. RN may call report to [phone]  Renesmae Donahey, MS, CCC-SLP Rehab Admissions Coordinator  [phone] (celll) [phone] (office)  

## 2022-07-04 NOTE — PMR Pre-admission (Signed)
PMR Admission Coordinator Pre-Admission Assessment  Patient: Jonathan [name] is an 47 y.o., male MRN: [id number] DOB: 01-31-1976 Height: 6' (182.9 cm) Weight: 118 kg  Insurance Information HMO: yes    PPO:      PCP:      IPA:      80/20:      OTHER:  PRIMARY: [employer]       Policy#: [id number]      Subscriber: pt.  CM Name: Oscar La fax: [phone]      Phone#: [phone] x [phone]     UM with [employer] called 2/19 with approval for admit on 2/19 for 7 days with updates due [id number] Pre-Cert#: [id number]      Employer:  Eff Date: 05/16/2022- 05/16/2023 Deductible: does not have one  OOP Max: $2,400 ($0 met)  CIR: 90% coverage, 10% co-insurance  SNF: 90% coverage, 10% co-insurance; limited to 36 days/cal yr (23 remaining)  Outpatient: 90% coverage, 10% co-insurance; limited to 30 combined visits/cal yr (30 remaining)  Home Health:  90% coverage, 10% co-insurance  DME: 90% coverage, 10% co-insurance  Providers: in network  SECONDARY:       Policy#:      Phone#:    Development worker, community:       Phone#:   [employer], [employer]" for patients in Inpatient Rehabilitation Facilities with attached "Privacy Act [place] Records" was provided and verbally reviewed with: Patient  Emergency Contact Information Contact Information     Name Relation Home Work Mobile   [place] Sister   [phone]       Current Medical History  Patient Admitting Diagnosis: CVA History of Present Illness: Jonathan [name] is a 47 y.o. male with medical history significant for ongoing tobacco abuse of 1/2 pack/day, hypertension who takes his amlodipine only intermittently who presented to the [facility] ED 06/30/22 or left arm weakness and left leg weakness and  suspected acute stroke.In the ED, he was noted to have an initial blood pressure of 191/116.  Stat noncontrast head CT shows small acute infarct in the right basal ganglia.   Patient was given aspirin and  Plavix, seen by teleneurologist Dr. Quinn Axe.  MRI brain noted a small acute infarct in the descending white matter tracts of the right basal ganglia and overlying frontal lobe CTa head and neck notes no emergent vessel findings without significant stenosis appreciated. TTE revealed EF 55-60% with no WMA and normal diastolic function and no atrial level shunt detected. Neurology recommends  Aspirin plus Plavix x 21 days then Plavix alone. Pt. Seen by PT/OT and they recommend CIR to assist return to PLOF.  Complete NIHSS TOTAL: 3  Patient's medical record from [facility] has been reviewed by the rehabilitation admission coordinator and physician.  Past Medical History  Past Medical History:  Diagnosis Date   Hypertension    Lower back pain     Has the patient had major surgery during 100 days prior to admission? No  Family History   family history includes Cancer in his mother; Hypertension in an other family member.  Current Medications  Current Facility-Administered Medications:    acetaminophen (TYLENOL) tablet 650 mg, 650 mg, Oral, Q6H PRN, Cherene Altes, MD, 650 mg at 07/04/22 0750   amLODipine (NORVASC) tablet 10 mg, 10 mg, Oral, Daily, Cherene Altes, MD, 10 mg at 07/04/22 [id number]   aspirin chewable tablet 81 mg, 81 mg, Oral, Daily, Hollice Gong, Mir M, MD, 81 mg at 07/04/22 [phone]  atorvastatin (LIPITOR) tablet 20 mg, 20 mg, Oral, Daily, Hollice Gong, Mir M, MD, 20 mg at 07/04/22 [id number]   baclofen (LIORESAL) tablet 5 mg, 5 mg, Oral, TID PRN, Cherene Altes, MD, 5 mg at 07/03/22 2023   clopidogrel (PLAVIX) tablet 75 mg, 75 mg, Oral, Daily, Hollice Gong, Mir M, MD, 75 mg at 07/04/22 0752   enoxaparin (LOVENOX) injection 40 mg, 40 mg, Subcutaneous, Q24H, Hollice Gong, Mir M, MD, 40 mg at 07/03/22 1758   hydrochlorothiazide (HYDRODIURIL) tablet 12.5 mg, 12.5 mg, Oral, Daily, Joette Catching T, MD, 12.5 mg at 07/04/22 1045   irbesartan (AVAPRO) tablet 75 mg, 75 mg, Oral,  Daily, Joette Catching T, MD, 75 mg at 07/04/22 1045   nicotine (NICODERM CQ - dosed in mg/24 hr) patch 7 mg, 7 mg, Transdermal, Daily, Hollice Gong, Mir M, MD, 7 mg at 07/04/22 0752   ondansetron (ZOFRAN) injection 4 mg, 4 mg, Intravenous, Q6H PRN, Hollice Gong, Mir M, MD   senna-docusate (Senokot-S) tablet 1 tablet, 1 tablet, Oral, QHS PRN, Hollice Gong, Mir M, MD   temazepam (RESTORIL) capsule 15 mg, 15 mg, Oral, QHS PRN, Hollice Gong, Mir M, MD, 15 mg at 07/03/22 2149  Patients Current Diet:  Diet Order             Diet regular Room service appropriate? Yes; Fluid consistency: Thin  Diet effective now                   Precautions / Restrictions Precautions Precautions: Fall Restrictions Weight Bearing Restrictions: No   Has the patient had 2 or more falls or a fall with injury in the past year? 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 Independent  Indoor Mobility: Did the patient need assistance with walking from room to room (with or without device)? Independent  Stairs: Did the patient need assistance with internal or external stairs (with or without device)? Independent  Functional Cognition: Did the patient need help planning regular tasks such as shopping or remembering to take medications? Independent  Patient Information Are you of Hispanic, Latino/a,or Spanish origin?: A. No, not of Hispanic, Latino/a, or Spanish origin What is your race?: B. Black or African American Do you need or want an interpreter to communicate with a doctor or health care staff?: 0. No  Patient's Response To:  Health Literacy and Transportation Is the patient able to respond to health literacy and transportation needs?: Yes Health Literacy - How often do you need to have someone help you when you read instructions, pamphlets, or other written material  from your doctor or pharmacy?: Never In the past 12 months, has lack of transportation kept you from medical appointments or from getting medications?: No In the past 12 months, has lack of transportation kept you from meetings, work, or from getting things needed for daily living?: No  Development worker, international aid / [place] Devices/Equipment: None Home Equipment: None  Prior Device Use: Indicate devices/aids used by the patient prior to current illness, exacerbation or injury? None of the above  Current Functional Level Cognition  Overall Cognitive Status: Impaired/Different from baseline Orientation Level: Oriented X4 Safety/Judgement: Decreased awareness of safety General Comments: Pt with no significant cognitive deficits, but is a more risky than someone should be with left sided weakness. Fiance states he is "just stubborn".    Extremity Assessment (includes Sensation/Coordination)  Upper Extremity Assessment: Defer to  OT evaluation LUE Deficits / Details: shoulder and composite finger flex/ext 2/5, rest 3/5 LUE Sensation: decreased light touch LUE Coordination: decreased fine motor, decreased gross motor  Lower Extremity Assessment: LLE deficits/detail LLE Deficits / Details: left leg is 3+/5 throughout with deminished LT sensation LLE Sensation: decreased light touch    ADLs  Overall ADL's : Needs assistance/impaired Eating/Feeding: Modified independent, Sitting Eating/Feeding Details (indicate cue type and reason): only using RUE Grooming: Wash/dry hands, Wash/dry face, Min guard, Standing Grooming Details (indicate cue type and reason): for safety Upper Body Bathing: Sitting, Minimal assistance Lower Body Bathing: Minimal assistance, Sit to/from stand Upper Body Dressing : Minimal assistance, Sitting Lower Body Dressing: Moderate assistance, Sitting/lateral leans Lower Body Dressing Details (indicate cue type and reason): able to doff socks without assistance  and mod assist to donn due to assistance needed to get over toes Toilet Transfer: Minimal assistance, Ambulation Toilet Transfer Details (indicate cue type and reason): simulated bed>door>back to bed Toileting- Clothing Manipulation and Hygiene: Minimal assistance, Sit to/from stand General ADL Comments: limited due to difficulty using LUE    Mobility  Overal bed mobility: Modified Independent General bed mobility comments: up in recliner    Transfers  Overall transfer level: Needs assistance Equipment used: None Transfers: Sit to/from Stand Sit to Stand: Min guard General transfer comment: min guard for balance    Ambulation / Gait / Stairs / Wheelchair Mobility  Ambulation/Gait Ambulation/Gait assistance: Counsellor (Feet): 65 Feet (65", 65") Assistive device: None, Straight cane Gait Pattern/deviations: Step-through pattern, Decreased dorsiflexion - left, Knee flexed in stance - left, Decreased weight shift to left, Decreased stance time - left General Gait Details: Min cues for L quad activation in midstance, heel strike, and neutral left foot positioning. Trialed gait with and without cane Gait velocity: decreased Gait velocity interpretation: 1.31 - 2.62 ft/sec, indicative of limited community ambulator Stairs: Yes Stairs assistance: Min guard Stair Management: Two rails Number of Stairs: 20 General stair comments: Forward and lateral step ups with min guard assist    Posture / Balance Balance Overall balance assessment: Needs assistance Sitting-balance support: No upper extremity supported Sitting balance-Leahy Scale: Good Standing balance support: No upper extremity supported Standing balance-Leahy Scale: Fair Standing balance comment: min guard when standing at sink, posterior leaning at times but able to correct    Special needs/care consideration Special service needs none   Previous Home Environment (from acute therapy documentation) Living  Arrangements: Spouse/significant other, Children Available Help at Discharge: Family, Available 24 hours/day Type of Home: House Home Layout: One level Home Access: Stairs to enter Entrance Stairs-Rails: None Entrance Stairs-Number of Steps: 4 Bathroom Shower/Tub: Chiropodist: Standard Home Care Services: No Additional Comments: fiance works night shift, 47 yo is home at night  Discharge Living Setting Plans for Discharge Living Setting: Patient's home Type of Home at Discharge: House Discharge Home Layout: One level Discharge Home Access: Stairs to enter Entrance Stairs-Rails: None Entrance Stairs-Number of Steps: 4 Discharge Bathroom Shower/Tub: Rhame unit Discharge Bathroom Toilet: Standard Discharge Bathroom Accessibility: Yes How Accessible: Accessible via walker Does the patient have any problems obtaining your medications?: No  Social/Family/Support Systems 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 to d/c home with mod I goals Caregiver Availability: Intermittent Discharge Plan Discussed with Primary Caregiver: No Is Caregiver In Agreement with Plan?: No Does Caregiver/Family have Issues with Lodging/Transportation while Pt is in  Rehab?: No  Goals Patient/Family Goal for Rehab: PT/OT/SLP Mod I Expected length of stay: 5-7 days Pt/Family Agrees to Admission and willing to participate: Yes Program Orientation Provided & Reviewed with Pt/Caregiver Including Roles  & Responsibilities: Yes  Decrease burden of Care through IP rehab admission: n/a  Possible need for SNF placement upon discharge: not anticipated  Patient Condition: I have reviewed medical records from [facility], spoken with CM, and patient and daughter. I met with patient at the bedside for inpatient rehabilitation assessment.  Patient will benefit from  ongoing PT, OT, and SLP, can actively participate in 3 hours of therapy a day 5 days of the week, and can make measurable gains during the admission.  Patient will also benefit from the coordinated team approach during an Inpatient Acute Rehabilitation admission.  The patient will receive intensive therapy as well as Rehabilitation physician, nursing, social worker, and care management interventions.  Due to safety, skin/wound care, disease management, medication administration, pain management, and patient education the patient requires 24 hour a day rehabilitation nursing.  The patient is currently Min  with mobility and basic ADLs.  Discharge setting and therapy post discharge at home with home health is anticipated.  Patient has agreed to participate in the Acute Inpatient Rehabilitation Program and will admit today.  Preadmission Screen Completed By:  Genella Mech, 07/04/2022 2:28 PM ______________________________________________________________________   Discussed status with Dr. Aretta Nip  on 07/04/22  at 50 and received approval for admission today.  Admission Coordinator:  Genella Mech, CCC-SLP, time 1438/Date 07/04/22   Assessment/Plan: Diagnosis:  Right Basal ganglia infarct  Does the need for close, 24 hr/day Medical supervision in concert with the patient's rehab needs make it unreasonable for this patient to be served in a less intensive setting? Yes Co-Morbidities requiring supervision/potential complications: HTN, Cocaine and tobacco abuse Due to bladder management, bowel management, safety, skin/wound care, disease management, medication administration, pain management, and patient education, does the patient require 24 hr/day rehab nursing? Yes Does the patient require coordinated care of a physician, rehab nurse, PT, OT, and SLP to address physical and functional deficits in the context of the above medical diagnosis(es)? Yes Addressing deficits in the following areas: balance,  endurance, locomotion, strength, transferring, bathing, dressing, feeding, grooming, toileting, and psychosocial support Can the patient actively participate in an intensive therapy program of at least 3 hrs of therapy 5 days a week? Yes The potential for patient to make measurable gains while on inpatient rehab is excellent Anticipated functional outcomes upon discharge from inpatient rehab: modified independent PT, modified independent OT, n/a SLP Estimated rehab length of stay to reach the above functional goals is: 5-7d Anticipated discharge destination: Home 10. Overall Rehab/Functional Prognosis: excellent   MD Signature: Charlett Blake M.D. [facility] Fellow Am Acad of Phys Med and Rehab Diplomate Am Board of Electrodiagnostic Med Fellow Am Board of Interventional Pain

## 2022-07-04 NOTE — H&P (Signed)
Physical Medicine and Rehabilitation Admission H&P        Chief Complaint  Patient presents with   Functional deficits due to stroke.       HPI:  Jonathan [name]. Kirkman is a 47 year old male with history of HTN (but intermittent med use), tobacco use who was admitted on 06/30/22 with left arm and leg weakness which started the night before. CTA head/neck was negative for LVO. UDS positive for cocaine.  MRI brain showed small acute infarct in white matter tracks in right basal ganglia and overlying frontal lobe.  2D echo showed EF 55-60% with no wall abnormality and AV sclerosis. Dr. 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 He recommended DAPT X 3 weeks followed by Plavix alone as well as participation in sleep smart study given risk of sleep apnea. Therapy has been working with patient who continues to be limited by LLE weakness with knee instability, LUE weakness with sensory deficits and decreased in safety awareness. CIR recommended due to functional decline.       Review of Systems  Constitutional:  Negative for fever.  HENT:  Negative for hearing loss.   Eyes:  Positive for blurred vision (at times--sees spots when BP is high).  Respiratory:  Negative for cough and shortness of breath.   Cardiovascular:  Negative for chest pain and leg swelling.  Gastrointestinal:  Positive for heartburn (takes Pepto-Bismol). Negative for constipation.  Genitourinary:  Negative for dysuria and urgency.  Musculoskeletal:  Positive for back pain (occasionally due to prior back injury). Negative for myalgias.  Skin:  Negative for rash.  Neurological:  Positive for sensory change, focal weakness and headaches (at times due to BP elevation). Negative for dizziness.  Psychiatric/Behavioral:  The patient has insomnia (usually sleeps from 3/4 am to noon due to his job.).             Past Medical History:  Diagnosis Date   Hypertension     Lower back pain        History  reviewed. No pertinent surgical history.          Family History  Problem Relation Age of Onset   Cancer Mother     Diabetes Maternal Grandmother     Hypertension Other        Social History:  Lives with fiance. 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 He  reports that he has been smoking cigarettes. He has been smoking an average of 1 pack per day. He has never used smokeless tobacco. He reports current alcohol use--couple of beer or liqour few times a month.  He reports current drug use. Drug: Marijuana and cocaine occasionally. .     Allergies: No Known Allergies           Medications Prior to Admission  Medication Sig Dispense Refill   amLODipine (NORVASC) 5 MG tablet Take 1 tablet (5 mg total) by mouth daily. 30 tablet 3   ibuprofen (ADVIL) 800 MG tablet Take 1 tablet (800 mg total) by mouth every 8 (eight) hours as needed. (Patient taking differently: Take 800 mg by mouth every 8 (eight) hours as needed (for headaches).) 30 tablet 0   Multiple Vitamins-Minerals (MULTIVITAMIN MEN) TABS Take 1 tablet by mouth daily with breakfast.       tiZANidine (ZANAFLEX) 4 MG tablet Take 1 tablet (4 mg total) by mouth every 6 (six) hours as needed for muscle spasms. (Patient not taking: Reported on 06/30/2022) 30 tablet 0  traZODone (DESYREL) 50 MG tablet Take 0.5-2 tablets (25-100 mg total) by mouth at bedtime. (Patient not taking: Reported on 06/30/2022) 20 tablet 0          Home: Home Living Family/patient expects to be discharged to:: Private residence Living Arrangements: Spouse/significant other, Children Available Help at Discharge: Family, Available 24 hours/day Type of Home: House Home Access: Stairs to enter [employer] of Steps: 4 Entrance Stairs-Rails: None Home Layout: One level Bathroom Shower/Tub: Chiropodist: Standard Home Equipment: None Additional Comments: fiance works night shift, 47 yo is home at night   Functional History: Prior  Function Prior Level of Function : Independent/Modified Independent, Driving Mobility Comments: 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 ADLs Comments: fiance does A him with his socks "I'm too fat"   Functional Status:  Mobility: Bed Mobility Overal bed mobility: Modified Independent General bed mobility comments: up in recliner Transfers Overall transfer level: Needs assistance Equipment used: None Transfers: Sit to/from Stand Sit to Stand: Min guard General transfer comment: min guard for balance Ambulation/Gait Ambulation/Gait assistance: Min guard Gait Distance (Feet): 65 Feet (65", 65") Assistive device: None, Straight cane Gait Pattern/deviations: Step-through pattern, Decreased dorsiflexion - left, Knee flexed in stance - left, Decreased weight shift to left, Decreased stance time - left General Gait Details: Min cues for L quad activation in midstance, heel strike, and neutral left foot positioning. Trialed gait with and without cane Gait velocity: decreased Gait velocity interpretation: 1.31 - 2.62 ft/sec, indicative of limited community ambulator Stairs: Yes Stairs assistance: Min guard Stair Management: Two rails Number of Stairs: 20 General stair comments: Forward and lateral step ups with min guard assist   ADL: ADL Overall ADL's : Needs assistance/impaired Eating/Feeding: Modified independent, Sitting Eating/Feeding Details (indicate cue type and reason): only using RUE Grooming: Wash/dry hands, Wash/dry face, Min guard, Standing Grooming Details (indicate cue type and reason): for safety Upper Body Bathing: Sitting, Minimal assistance Lower Body Bathing: Minimal assistance, Sit to/from stand Upper Body Dressing : Minimal assistance, Sitting Lower Body Dressing: Moderate assistance, Sitting/lateral leans Lower Body Dressing Details (indicate cue type and reason): able to doff socks without assistance and mod assist to donn due to assistance needed to get over  toes Toilet Transfer: Minimal assistance, Ambulation Toilet Transfer Details (indicate cue type and reason): simulated bed>door>back to bed Toileting- Clothing Manipulation and Hygiene: Minimal assistance, Sit to/from stand General ADL Comments: limited due to difficulty using LUE   Cognition: Cognition Overall Cognitive Status: Impaired/Different from baseline Orientation Level: Oriented X4 Cognition Arousal/Alertness: Awake/alert Behavior During Therapy: WFL for tasks assessed/performed Overall Cognitive Status: Impaired/Different from baseline Area of Impairment: Safety/judgement Safety/Judgement: Decreased awareness of safety General Comments: Pt with no significant cognitive deficits, but is a more risky than someone should be with left sided weakness. Fiance states he is "just stubborn".   Physical Exam: Blood pressure (!) 171/101, pulse 95, temperature 98 F (36.7 C), resp. rate 16, height 6' (1.829 m), weight 118 kg, SpO2 96 %. Physical Exam Vitals and nursing note reviewed.  Constitutional:      Appearance: Normal appearance.  Pulmonary:     Effort: Pulmonary effort is normal.     Breath sounds: Normal breath sounds.  Musculoskeletal:     Cervical back: Normal range of motion.  Neurological:     Mental Status: He is alert and oriented to person, place, and time.     Sensory: Sensory deficit present.     Comments: Speech clear. Able to  follow simple  step commands. Sitting at EOB with good balance. Lspeech without disarthria Cognition intacteft hemiplegia with sensory deficits LUE/LLE.       General: No acute distress Mood and affect are appropriate Heart: Regular rate and rhythm no rubs murmurs or extra sounds Lungs: Clear to auscultation, breathing unlabored, no rales or wheezes Abdomen: Positive bowel sounds, soft nontender to palpation, nondistended Extremities: No clubbing, cyanosis, or edema Skin: No evidence of breakdown, no evidence of rash Neurologic:  Cranial nerves II through XII intact, motor strength is 5/5 in right  deltoid, bicep, tricep, grip, hip flexor, knee extensors, ankle dorsiflexor and plantar flexor 3- left delt , bi, tri, grip, 4/5 Left HF, KE ADF Sensory exam normal sensation to light touch in UE although pt has some parasthesias Cerebellar exam not tested due to weakness on left  Musculoskeletal: Full range of motion in all 4 extremities. No joint swelling    Lab Results Last 48 Hours  No results found for this or any previous visit (from the past 48 hour(s)).   Imaging Results (Last 48 hours)  No results found.         Blood pressure (!) 171/101, pulse 95, temperature 98 F (36.7 C), resp. rate 16, height 6' (1.829 m), weight 118 kg, SpO2 96 %.   Medical Problem List and Plan: 1. Functional deficits secondary to RIght subcortical infarct             -patient may  shower             -ELOS/Goals: 5-7 d Mod I 2.  Antithrombotics: -DVT/anticoagulation:  Pharmaceutical: Lovenox             -antiplatelet therapy: DAPT X 3 weeks followed by Plavix alone starting 03/08 3. Pain Management: Tylenol prn. Baclofen TID prn for issues with back pain.  4. Mood/Behavior/Sleep:               -antipsychotic agents: N/A             --Restoril prn for sleep.  5. Neuropsych/cognition: This patient is capable of making decisions on his own behalf. 6. Skin/Wound Care: Routine pressure relief measures.  7. Fluids/Electrolytes/Nutrition: Monitor I/O. Check CMET in am.  8. HTN: Does not have PCP/used meds prn. Will monitor BP TID --Continue  Avapro and HCTZ (were added 02/19). Amlodipine resumed on 02/18.             --Avoid hypoperfusion. Titrate as indicated.  9. Elevated Lipids: Trig-161. HDL-35-->Now on Lipitor. 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 Restoril prn effective 11. Tobacco use: Continue nicotine patch.  13.  Impaired fasting blood sugar: Hgb A1C- 5.9.   14.CKD?: SCr 1.37 at admission Recheck  CMET in am.      Bary Leriche, PA-C 07/04/2022 "I have personally performed a face to face diagnostic evaluation of this patient.  Additionally, I have reviewed and concur with the physician assistant's documentation above." Charlett Blake M.D. [facility] Fellow Am [employer] of [employer] and Rehab Diplomate Am Board of Electrodiagnostic Med Fellow Am Board of Interventional Pain

## 2022-07-05 DIAGNOSIS — I639 Cerebral infarction, unspecified: Secondary | ICD-10-CM | POA: Diagnosis not present

## 2022-07-05 LAB — COMPREHENSIVE METABOLIC PANEL
ALT: 24 U/L (ref 0–44)
AST: 24 U/L (ref 15–41)
Albumin: 3.5 g/dL (ref 3.5–5.0)
Alkaline Phosphatase: 72 U/L (ref 38–126)
Anion gap: 9 (ref 5–15)
BUN: 19 mg/dL (ref 6–20)
CO2: 27 mmol/L (ref 22–32)
Calcium: 9.2 mg/dL (ref 8.9–10.3)
Chloride: 101 mmol/L (ref 98–111)
Creatinine, Ser: 1.6 mg/dL — ABNORMAL HIGH (ref 0.61–1.24)
GFR, Estimated: 53 mL/min — ABNORMAL LOW (ref >60)
Glucose, Bld: 197 mg/dL — ABNORMAL HIGH (ref 70–99)
Potassium: 3.3 mmol/L — ABNORMAL LOW (ref 3.5–5.1)
Sodium: 137 mmol/L (ref 135–145)
Total Bilirubin: 0.5 mg/dL (ref 0.3–1.2)
Total Protein: 6.6 g/dL (ref 6.5–8.1)

## 2022-07-05 LAB — CBC WITH DIFFERENTIAL/PLATELET
Abs Immature Granulocytes: 0.02 10*3/uL (ref 0.00–0.07)
Basophils Absolute: 0 10*3/uL (ref 0.0–0.1)
Basophils Relative: 1 %
Eosinophils Absolute: 0.1 10*3/uL (ref 0.0–0.5)
Eosinophils Relative: 2 %
HCT: 39.8 % (ref 39.0–52.0)
Hemoglobin: 13.9 g/dL (ref 13.0–17.0)
Immature Granulocytes: 0 %
Lymphocytes Relative: 28 %
Lymphs Abs: 1.8 10*3/uL (ref 0.7–4.0)
MCH: 31 pg (ref 26.0–34.0)
MCHC: 34.9 g/dL (ref 30.0–36.0)
MCV: 88.8 fL (ref 80.0–100.0)
Monocytes Absolute: 0.6 10*3/uL (ref 0.1–1.0)
Monocytes Relative: 9 %
Neutro Abs: 3.7 10*3/uL (ref 1.7–7.7)
Neutrophils Relative %: 60 %
Platelets: 232 10*3/uL (ref 150–400)
RBC: 4.48 MIL/uL (ref 4.22–5.81)
RDW: 12.6 % (ref 11.5–15.5)
WBC: 6.2 10*3/uL (ref 4.0–10.5)
nRBC: 0 % (ref 0.0–0.2)

## 2022-07-05 MED ORDER — POTASSIUM CHLORIDE CRYS ER 10 MEQ PO TBCR
10.0000 meq | EXTENDED_RELEASE_TABLET | Freq: Every day | ORAL | Status: DC
  Administered 2022-07-05 – 2022-07-06 (×2): 10 meq via ORAL
  Filled 2022-07-05 (×3): qty 1

## 2022-07-05 NOTE — Progress Notes (Signed)
Inpatient Rehabilitation  Patient information reviewed and entered into eRehab system by Koury Roddy Mirha Brucato, OTR/L, Rehab Quality Coordinator.   Information including medical coding, functional ability and quality indicators will be reviewed and updated through discharge.   

## 2022-07-05 NOTE — Evaluation (Addendum)
Occupational Therapy Assessment and Plan  Patient Details  Name: Jonathan [name] MRN: [id number] Date of Birth: [birth year]  OT Diagnosis: abnormal posture, acute pain, cognitive deficits, hemiplegia affecting non-dominant side, and muscle weakness (generalized) Rehab Potential: Rehab Potential (ACUTE ONLY): Excellent ELOS: 3-5 days   Today's Date: 07/05/2022 OT Individual Time: 0950-1100 OT Individual Time Calculation (min): 70 min     Hospital Problem: Principal Problem:   Right basal ganglia embolic stroke [facility])   Past Medical History:  Past Medical History:  Diagnosis Date   Hypertension    Lower back pain    Past Surgical History: History reviewed. No pertinent surgical history.  Assessment & Plan Clinical Impression:  Jonathan [name] is a 47 year old male with history of HTN (but intermittent med use), tobacco use who was admitted on 06/30/22 with left arm and leg weakness which started the night before. CTA head/neck was negative for LVO. UDS positive for cocaine.  MRI brain showed small acute infarct in white matter tracks in right basal ganglia and overlying frontal lobe.  2D echo showed EF 55-60% with no wall abnormality and AV sclerosis. Dr. 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 He recommended DAPT X 3 weeks followed by Plavix alone as well as participation in sleep smart study given risk of sleep apnea. Therapy has been working with patient who continues to be limited by LLE weakness with knee instability, LUE weakness with sensory deficits and decreased in safety awareness. CIR recommended due to functional decline. Patient transferred to CIR on 07/04/2022 .    Patient currently requires CGA-min with basic self-care skills secondary to muscle weakness, decreased cardiorespiratoy endurance, decreased coordination, decreased awareness and decreased safety awareness, and decreased standing balance and hemiplegia.  Prior to hospitalization,  patient could complete BADLs/functional ambulation independently.  Patient will benefit from skilled intervention to decrease level of assist with basic self-care skills, increase independence with basic self-care skills, and increase level of independence with iADL prior to discharge home with care partner.  Anticipate patient will be mod I at DC and follow up outpatient.  OT - End of Session Activity Tolerance: Tolerates 10 - 20 min activity with multiple rests Endurance Deficit: Yes Endurance Deficit Description: required multiple rest breaks OT Assessment Rehab Potential (ACUTE ONLY): Excellent OT Barriers to Discharge: Home environment access/layout;Lack of/limited family support OT Patient demonstrates impairments in the following area(s): Balance;Cognition;Endurance;Motor;Pain;Safety;Sensory OT Basic ADL's Functional Problem(s): Bathing;Dressing;Toileting OT Advanced ADL's Functional Problem(s): Simple Meal Preparation;Laundry OT Transfers Functional Problem(s): Toilet;Tub/Shower OT Additional Impairment(s): Fuctional Use of Upper Extremity OT Plan OT Intensity: Minimum of 1-2 x/day, 45 to 90 minutes OT Frequency: 5 out of 7 days OT Duration/Estimated Length of Stay: 3-5 days OT Treatment/Interventions: Balance/vestibular training;Discharge planning;Functional electrical stimulation;Pain management;Self Care/advanced ADL retraining;Therapeutic Activities;UE/LE Coordination activities;Disease mangement/prevention;Functional mobility training;Patient/family education;Therapeutic Exercise;DME/adaptive equipment instruction;Neuromuscular re-education;UE/LE Strength taining/ROM OT Self Feeding Anticipated Outcome(s): Mod I OT Basic Self-Care Anticipated Outcome(s): Mod I OT Toileting Anticipated Outcome(s): Mod I OT Bathroom Transfers Anticipated Outcome(s): Mod I OT Recommendation Recommendations for Other Services: Therapeutic Recreation consult;Neuropsych consult Therapeutic  Recreation Interventions: Stress management Patient destination: Home Follow Up Recommendations: Outpatient OT Equipment Recommended: Other (comment) Equipment Details: needs a shower chair   OT Evaluation Precautions/Restrictions  Precautions Precautions: Fall Precaution Comments: L hemi Restrictions Weight Bearing Restrictions: No Home Living/Prior [place] expects to be discharged to:: Private residence Living Arrangements: Spouse/significant other, Children Available Help at Discharge: Family, Available PRN/intermittently (fiance works night  shift (5AM-5PM, 4 days on/off), daughter is 60) Type of Home: House Home Access: Stairs to enter [employer] of Steps: 4 Entrance Stairs-Rails: None Home Layout: One level Bathroom Shower/Tub: Tub/shower unit, Architectural technologist: Handicapped height Bathroom Accessibility: No Additional Comments: fiance works night shift  Lives With: Significant other, Daughter (fiance) IADL History Homemaking Responsibilities: Yes Meal Prep Responsibility: Therapist, occupational Responsibility: Primary Homemaking Comments: was making meals/doing laundry PTA and would like/need to do at DC Current License: Yes Mode of Transportation: Other (comment) (SUV) 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 Baseline Vision/History: 0 No visual deficits Ability to See in Adequate Light: 0 Adequate Patient Visual Report: No change from baseline Vision Assessment?: No apparent visual deficits Perception  Perception: Within Functional Limits Praxis Praxis:  Intact Cognition Cognition Overall Cognitive Status: Within Functional Limits for tasks assessed Arousal/Alertness: Awake/alert Orientation Level: Person;Place;Situation Person: Oriented Place: Oriented Situation: Oriented Memory: Appears intact Awareness: Impaired Problem Solving: Appears intact Safety/Judgment: Impaired Comments: decreased awareness to functional deficits Brief Interview for Mental Status (BIMS) Repetition of Three Words (First Attempt): 3 Temporal Orientation: Year: Correct Temporal Orientation: Month: Accurate within 5 days Temporal Orientation: Day: Correct Recall: "Sock": Yes, no cue required Recall: "Blue": Yes, no cue required Recall: "Bed": Yes, no cue required BIMS Summary Score: 15 Sensation Sensation Light Touch: Impaired Detail Light Touch Impaired Details: Impaired LUE;Impaired LLE Proprioception: Appears Intact Additional Comments: decreased sensation to light touch lower LLE>RLE, numbness/tingling on L great toe, R thigh, and L hand all digits except thumb Coordination Gross Motor Movements are Fluid and Coordinated: No Fine Motor Movements are Fluid and Coordinated: No Coordination and Movement Description: mild uncoordinated due to L hemi, weakness, decreased endurance, decreased sensation Finger Nose Finger Test: slow, decreased ROM, and dysmetria on LUE Heel Shin Test: slower LLE>RLE Motor  Motor Motor: Hemiplegia Motor - Skilled Clinical Observations: mild uncoordinated due to L hemi, weakness, decreased endurance, decreased sensation  Trunk/Postural Assessment  Cervical Assessment Cervical Assessment: Within Functional Limits Thoracic Assessment Thoracic Assessment: Within Functional Limits Lumbar Assessment Lumbar Assessment: Within Functional Limits Postural Control Postural Control: Deficits on evaluation Protective Responses: reiled on external support with high level gait  Balance Balance Balance Assessed:  Yes Standardized Balance Assessment Standardized Balance Assessment: Functional Gait Assessment;Timed Up and Go Test Timed Up and Go Test TUG: Normal TUG Normal TUG (seconds): 11 Static Sitting Balance Static Sitting - Balance Support: Feet supported;No upper extremity supported Static Sitting - Level of Assistance: 7: Independent Dynamic Sitting Balance Dynamic Sitting - Balance Support: Feet supported;No upper extremity supported Dynamic Sitting - Level of Assistance: 7: Independent Sitting balance - Comments: activites within BOS Static Standing Balance Static Standing - Balance Support: No upper extremity supported Static Standing - Level of Assistance: 5: Stand by assistance (CGA) Dynamic Standing Balance Dynamic Standing - Balance Support: No upper extremity supported Dynamic Standing - Level of Assistance: 5: Stand by assistance (CGA) Dynamic Standing - Comments: self care washing face/hands, toileting, transfers, and gait Functional Gait  Assessment Gait assessed : Yes Gait Level Surface: Walks 20 ft in less than 7 sec but greater than 5.5 sec, uses assistive device, slower speed, mild gait deviations, or deviates 6-10 in outside of  the 12 in walkway width. Change in Gait Speed: Able to change speed, demonstrates mild gait deviations, deviates 6-10 in outside of the 12 in walkway width, or no gait deviations, unable to achieve a major change in velocity, or uses a change in velocity, or uses an assistive device. Gait with Horizontal Head Turns: Performs head turns smoothly with slight change in gait velocity (eg, minor disruption to smooth gait path), deviates 6-10 in outside 12 in walkway width, or uses an assistive device. Gait with Vertical Head Turns: Performs task with slight change in gait velocity (eg, minor disruption to smooth gait path), deviates 6 - 10 in outside 12 in walkway width or uses assistive device Gait and Pivot Turn: Pivot turns safely in greater than 3 sec and  stops with no loss of balance, or pivot turns safely within 3 sec and stops with mild imbalance, requires small steps to catch balance. Step Over Obstacle: Is able to step over one shoe box (4.5 in total height) but must slow down and adjust steps to clear box safely. May require verbal cueing. Gait with Narrow Base of Support: Ambulates 4-7 steps. Gait with Eyes Closed: Walks 20 ft, slow speed, abnormal gait pattern, evidence for imbalance, deviates 10-15 in outside 12 in walkway width. Requires more than 9 sec to ambulate 20 ft. Ambulating Backwards: Walks 20 ft, uses assistive device, slower speed, mild gait deviations, deviates 6-10 in outside 12 in walkway width. Steps: Two feet to a stair, must use rail. Total Score: 16 Extremity/Trunk Assessment RUE Assessment RUE Assessment: Within Functional Limits LUE Assessment LUE Assessment: Exceptions to [facility] Passive Range of Motion (PROM) Comments: WNL Active Range of Motion (AROM) Comments: Shoulder flexion limited to 120 degrees, otherwise [facility] General Strength Comments: 3+/5 proximally, 4-/5 distally  Care Tool Care Tool Self Care Eating   Eating Assist Level: Independent with assistive device    Oral Care    Oral Care Assist Level: Supervision/Verbal cueing (standing)    Bathing   Body parts bathed by patient: Right arm;Left arm;Chest;Abdomen;Front perineal area;Right upper leg;Buttocks;Left upper leg;Right lower leg;Left lower leg;Face     Assist Level: Contact Guard/Touching assist    Upper Body Dressing(including orthotics)   What is the patient wearing?: Pull over shirt   Assist Level: Contact Guard/Touching assist (standing)    Lower Body Dressing (excluding footwear)   What is the patient wearing?: Pants Assist for lower body dressing: Minimal Assistance - Patient > 75% (standing)    Putting on/Taking off footwear   What is the patient wearing?: Non-skid slipper socks Assist for footwear: Minimal Assistance - Patient >  75%       Care Tool Toileting Toileting activity   Assist for toileting: Supervision/Verbal cueing (standing)     Care Tool Bed Mobility Roll left and right activity   Roll left and right assist level: Independent    Sit to lying activity   Sit to lying assist level: Independent with assistive device Sit to lying assistive device comment: increased time  Lying to sitting on side of bed activity   Lying to sitting on side of bed assist level: the ability to move from lying on the back to sitting on the side of the bed with no back support.: Independent with assistive device Lying to sitting on side of bed assist device comment: the ability to move from lying on the back to sitting on the side of the bed with no back support.: increased time   Care Tool Transfers  Sit to stand transfer   Sit to stand assist level: Contact Guard/Touching assist    Chair/bed transfer   Chair/bed transfer assist level: Contact Guard/Touching assist     Toilet transfer   Assist Level: Contact Guard/Touching assist     Care Tool Cognition  Expression of Ideas and Wants Expression of Ideas and Wants: 4. Without difficulty (complex and basic) - expresses complex messages without difficulty and with speech that is clear and easy to understand  Understanding Verbal and Non-Verbal Content Understanding Verbal and Non-Verbal Content: 4. Understands (complex and basic) - clear comprehension without cues or repetitions   Memory/Recall Ability     Refer to Care Plan for [place] 1 OT Short Term Goal 1 (Week 1): STG = LTG due to ELOS  Recommendations for other services: Neuropsych and Therapeutic Recreation  Stress management   Skilled Therapeutic Intervention Patient received upright in bed upon therapy arrival and agreeable to participate in OT evaluation. Unrated pain reported with spasms in LLE, however intermittent and denied intervention with pt pre-medicated per report.  Education provided on OT purpose, therapy schedule, goals for therapy, and safety policy while in rehab. Patient demonstrates mild L hemiplegia with sensory deficits, balance, fine/gross motor coordination and safety awareness deficits resulting in difficulty completing BADL tasks without increased physical assist. Pt will benefit from skilled OT services to focus on mentioned deficits. See below for ADL at the shower level and functional transfer performance. Cues needed throughout for safety awareness and compliance with supervision from therapist as pt prefers to do everything independently but with guidance and education is receptive to assist. Pt remained upright in bed at conclusion of session with bed alarm on and all needs met at end of session.  ADL ADL Eating: Modified independent Where Assessed-Eating: Bed level Grooming: Supervision/safety Where Assessed-Grooming: Standing at sink Upper Body Bathing: Supervision/safety Where Assessed-Upper Body Bathing: Shower;Other (Comment) (standing) Lower Body Bathing: Contact guard Where Assessed-Lower Body Bathing: Shower;Other (Comment) (standing) Upper Body Dressing: Contact guard Where Assessed-Upper Body Dressing: Standing at sink Lower Body Dressing: Minimal assistance Where Assessed-Lower Body Dressing: Standing at sink Toileting: Supervision/safety Where Assessed-Toileting: Toilet;Other (Comment) (standing) Toilet Transfer: Therapist, music Method: Counselling psychologist: Grab bars Tub/Shower Transfer: Metallurgist Method: Optometrist: Civil engineer, contracting without back Social research officer, government: Curator Method: Heritage manager: Grab bars;Transfer tub bench Mobility  Bed Mobility Bed Mobility: Rolling Right;Rolling Left;Sit to Supine;Supine to Sit Rolling Right: Independent Rolling Left: Independent Supine to Sit:  Independent with assistive device (increased time) Sit to Supine: Independent with assistive device (increased time) Transfers Sit to Stand: Contact Guard/Touching assist Stand to Sit: Contact Guard/Touching assist   Discharge Criteria: Patient will be discharged from OT if patient refuses treatment 3 consecutive times without medical reason, if treatment goals not met, if there is a change in medical status, if patient makes no progress towards goals or if patient is discharged from hospital.  The above assessment, treatment plan, treatment alternatives and goals were discussed and mutually agreed upon: by patient  Blase Mess, MS, OTR/L  07/05/2022, 2:35 PM

## 2022-07-05 NOTE — Plan of Care (Signed)
  Problem: RH Balance Goal: LTG Patient will maintain dynamic standing with ADLs (OT) Description: LTG:  Patient will maintain dynamic standing balance with assist during activities of daily living (OT)  Flowsheets (Taken 07/05/2022 1218) LTG: Pt will maintain dynamic standing balance during ADLs with: Independent with assistive device   Problem: Sit to Stand Goal: LTG:  Patient will perform sit to stand in prep for activites of daily living with assistance level (OT) Description: LTG:  Patient will perform sit to stand in prep for activites of daily living with assistance level (OT) Flowsheets (Taken 07/05/2022 1218) LTG: PT will perform sit to stand in prep for activites of daily living with assistance level: Independent with assistive device   Problem: RH Bathing Goal: LTG Patient will bathe all body parts with assist levels (OT) Description: LTG: Patient will bathe all body parts with assist levels (OT) Flowsheets (Taken 07/05/2022 1218) LTG: Pt will perform bathing with assistance level/cueing: Independent with assistive device    Problem: RH Dressing Goal: LTG Patient will perform upper body dressing (OT) Description: LTG Patient will perform upper body dressing with assist, with/without cues (OT). Flowsheets (Taken 07/05/2022 1218) LTG: Pt will perform upper body dressing with assistance level of: Independent Goal: LTG Patient will perform lower body dressing w/assist (OT) Description: LTG: Patient will perform lower body dressing with assist, with/without cues in positioning using equipment (OT) Flowsheets (Taken 07/05/2022 1218) LTG: Pt will perform lower body dressing with assistance level of: Independent with assistive device   Problem: RH Toileting Goal: LTG Patient will perform toileting task (3/3 steps) with assistance level (OT) Description: LTG: Patient will perform toileting task (3/3 steps) with assistance level (OT)  Flowsheets (Taken 07/05/2022 1218) LTG: Pt will perform  toileting task (3/3 steps) with assistance level: Independent with assistive device   Problem: RH Functional Use of Upper Extremity Goal: LTG Patient will use RT/LT upper extremity as a (OT) Description: LTG: Patient will use right/left upper extremity as a stabilizer/gross assist/diminished/nondominant/dominant level with assist, with/without cues during functional activity (OT) Flowsheets (Taken 07/05/2022 1218) LTG: Use of upper extremity in functional activities: LUE as gross assist level LTG: Pt will use upper extremity in functional activity with assistance level of: Independent with assistive device   Problem: RH Simple Meal Prep Goal: LTG Patient will perform simple meal prep w/assist (OT) Description: LTG: Patient will perform simple meal prep with assistance, with/without cues (OT). Flowsheets (Taken 07/05/2022 1218) LTG: Pt will perform simple meal prep with assistance level of: Supervision/Verbal cueing   Problem: RH Laundry Goal: LTG Patient will perform laundry w/assist, cues (OT) Description: LTG: Patient will perform laundry with assistance, with/without cues (OT). Flowsheets (Taken 07/05/2022 1218) LTG: Pt will perform laundry with assistance level of: Independent with assistive device   Problem: RH Toilet Transfers Goal: LTG Patient will perform toilet transfers w/assist (OT) Description: LTG: Patient will perform toilet transfers with assist, with/without cues using equipment (OT) Flowsheets (Taken 07/05/2022 1218) LTG: Pt will perform toilet transfers with assistance level of: Independent with assistive device   Problem: RH Tub/Shower Transfers Goal: LTG Patient will perform tub/shower transfers w/assist (OT) Description: LTG: Patient will perform tub/shower transfers with assist, with/without cues using equipment (OT) Flowsheets (Taken 07/05/2022 1218) LTG: Pt will perform tub/shower stall transfers with assistance level of: Independent with assistive device

## 2022-07-05 NOTE — Progress Notes (Addendum)
PROGRESS NOTE   Subjective/Complaints:  Ate 100% breakfast but napping now, discussed rehab process  ROS- neg CP, SOB, N/V/D  Objective:   No results found. Recent Labs    07/05/22 0514  WBC 6.2  HGB 13.9  HCT 39.8  PLT 232   Recent Labs    07/04/22 1818 07/05/22 0514  NA  --  137  K  --  3.3*  CL  --  101  CO2  --  27  GLUCOSE  --  197*  BUN  --  19  CREATININE 1.58* 1.60*  CALCIUM  --  9.2   No intake or output data in the 24 hours ending 07/05/22 0801      Physical Exam: Vital Signs Blood pressure (!) 149/92, pulse 88, temperature 97.8 F (36.6 C), temperature source Oral, resp. rate 18, height 5' 11"$  (1.803 m), weight 122 kg, SpO2 96 %.   General: No acute distress Mood and affect are appropriate Heart: Regular rate and rhythm no rubs murmurs or extra sounds Lungs: Clear to auscultation, breathing unlabored, no rales or wheezes Abdomen: Positive bowel sounds, soft nontender to palpation, nondistended Extremities: No clubbing, cyanosis, or edema Skin: No evidence of breakdown, no evidence of rash Neurologic: Cranial nerves II through XII intact, motor strength is 5/5 in RIght and 4/5 left  deltoid, bicep, tricep, grip, hip flexor, knee extensors, ankle dorsiflexor and plantar flexor Sensory exam normal sensation to light touch and bilateral upper and lower extremities  Musculoskeletal: Full range of motion in all 4 extremities. No joint swelling   Assessment/Plan: 1. Functional deficits which require 3+ hours per day of interdisciplinary therapy in a comprehensive inpatient rehab setting. Physiatrist is providing close team supervision and 24 hour management of active medical problems listed below. Physiatrist and rehab team continue to assess barriers to discharge/monitor patient progress toward functional and medical goals  Care Tool:  Bathing              Bathing assist       Upper  Body Dressing/Undressing Upper body dressing        Upper body assist      Lower Body Dressing/Undressing Lower body dressing            Lower body assist       Toileting Toileting    Toileting assist       Transfers Chair/bed transfer  Transfers assist           Locomotion Ambulation   Ambulation assist              Walk 10 feet activity   Assist           Walk 50 feet activity   Assist           Walk 150 feet activity   Assist           Walk 10 feet on uneven surface  activity   Assist           Wheelchair     Assist               Wheelchair 50 feet with 2 turns activity  Assist            Wheelchair 150 feet activity     Assist          Blood pressure (!) 149/92, pulse 88, temperature 97.8 F (36.6 C), temperature source Oral, resp. rate 18, height 5' 11"$  (1.803 m), weight 122 kg, SpO2 96 %.  Signed      Physical Medicine and Rehabilitation Admission H&P          Chief Complaint  Patient presents with   Functional deficits due to stroke.       HPI:  Jonathan [name] is a 46 year old male with history of HTN (but intermittent med use), tobacco use who was admitted on 06/30/22 with left arm and leg weakness which started the night before. CTA head/neck was negative for LVO. UDS positive for cocaine.  MRI brain showed small acute infarct in white matter tracks in right basal ganglia and overlying frontal lobe.  2D echo showed EF 55-60% with no wall abnormality and AV sclerosis. Dr. 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 He recommended DAPT X 3 weeks followed by Plavix alone as well as participation in sleep smart study given risk of sleep apnea. Therapy has been working with patient who continues to be limited by LLE weakness with knee instability, LUE weakness with sensory deficits and decreased in safety awareness. CIR recommended due to functional  decline.       Review of Systems  Constitutional:  Negative for fever.  HENT:  Negative for hearing loss.   Eyes:  Positive for blurred vision (at times--sees spots when BP is high).  Respiratory:  Negative for cough and shortness of breath.   Cardiovascular:  Negative for chest pain and leg swelling.  Gastrointestinal:  Positive for heartburn (takes Pepto-Bismol). Negative for constipation.  Genitourinary:  Negative for dysuria and urgency.  Musculoskeletal:  Positive for back pain (occasionally due to prior back injury). Negative for myalgias.  Skin:  Negative for rash.  Neurological:  Positive for sensory change, focal weakness and headaches (at times due to BP elevation). Negative for dizziness.  Psychiatric/Behavioral:  The patient has insomnia (usually sleeps from 3/4 am to noon due to his job.).                Past Medical History:  Diagnosis Date   Hypertension     Lower back pain        History reviewed. No pertinent surgical history.              Family History  Problem Relation Age of Onset   Cancer Mother     Diabetes Maternal Grandmother     Hypertension Other        Social History:  Lives with fiance. 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 He  reports that he has been smoking cigarettes. He has been smoking an average of 1 pack per day. He has never used smokeless tobacco. He reports current alcohol use--couple of beer or liqour few times a month.  He reports current drug use. Drug: Marijuana and cocaine occasionally. .     Allergies: No Known Allergies                Medications Prior to Admission  Medication Sig Dispense Refill   amLODipine (NORVASC) 5 MG tablet Take 1 tablet (5 mg total) by mouth daily. 30 tablet 3   ibuprofen (ADVIL) 800 MG tablet Take 1 tablet (800 mg total) by  mouth every 8 (eight) hours as needed. (Patient taking differently: Take 800 mg by mouth every 8 (eight) hours as needed (for headaches).) 30 tablet 0   Multiple Vitamins-Minerals  (MULTIVITAMIN MEN) TABS Take 1 tablet by mouth daily with breakfast.       tiZANidine (ZANAFLEX) 4 MG tablet Take 1 tablet (4 mg total) by mouth every 6 (six) hours as needed for muscle spasms. (Patient not taking: Reported on 06/30/2022) 30 tablet 0   traZODone (DESYREL) 50 MG tablet Take 0.5-2 tablets (25-100 mg total) by mouth at bedtime. (Patient not taking: Reported on 06/30/2022) 20 tablet 0          Home: Home Living Family/patient expects to be discharged to:: Private residence Living Arrangements: Spouse/significant other, Children Available Help at Discharge: Family, Available 24 hours/day Type of Home: House Home Access: Stairs to enter [employer] of Steps: 4 Entrance Stairs-Rails: None Home Layout: One level Bathroom Shower/Tub: Chiropodist: Standard Home Equipment: None Additional Comments: fiance works night shift, 47 yo is home at night   Functional History: Prior Function Prior Level of Function : Independent/Modified Independent, Driving Mobility Comments: 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 ADLs Comments: fiance does A him with his socks "I'm too fat"   Functional Status:  Mobility: Bed Mobility Overal bed mobility: Modified Independent General bed mobility comments: up in recliner Transfers Overall transfer level: Needs assistance Equipment used: None Transfers: Sit to/from Stand Sit to Stand: Min guard General transfer comment: min guard for balance Ambulation/Gait Ambulation/Gait assistance: Min guard Gait Distance (Feet): 65 Feet (65", 65") Assistive device: None, Straight cane Gait Pattern/deviations: Step-through pattern, Decreased dorsiflexion - left, Knee flexed in stance - left, Decreased weight shift to left, Decreased stance time - left General Gait Details: Min cues for L quad activation in midstance, heel strike, and neutral left foot positioning. Trialed gait with and without cane Gait velocity:  decreased Gait velocity interpretation: 1.31 - 2.62 ft/sec, indicative of limited community ambulator Stairs: Yes Stairs assistance: Min guard Stair Management: Two rails Number of Stairs: 20 General stair comments: Forward and lateral step ups with min guard assist   ADL: ADL Overall ADL's : Needs assistance/impaired Eating/Feeding: Modified independent, Sitting Eating/Feeding Details (indicate cue type and reason): only using RUE Grooming: Wash/dry hands, Wash/dry face, Min guard, Standing Grooming Details (indicate cue type and reason): for safety Upper Body Bathing: Sitting, Minimal assistance Lower Body Bathing: Minimal assistance, Sit to/from stand Upper Body Dressing : Minimal assistance, Sitting Lower Body Dressing: Moderate assistance, Sitting/lateral leans Lower Body Dressing Details (indicate cue type and reason): able to doff socks without assistance and mod assist to donn due to assistance needed to get over toes Toilet Transfer: Minimal assistance, Ambulation Toilet Transfer Details (indicate cue type and reason): simulated bed>door>back to bed Toileting- Clothing Manipulation and Hygiene: Minimal assistance, Sit to/from stand General ADL Comments: limited due to difficulty using LUE   Cognition: Cognition Overall Cognitive Status: Impaired/Different from baseline Orientation Level: Oriented X4 Cognition Arousal/Alertness: Awake/alert Behavior During Therapy: WFL for tasks assessed/performed Overall Cognitive Status: Impaired/Different from baseline Area of Impairment: Safety/judgement Safety/Judgement: Decreased awareness of safety General Comments: Pt with no significant cognitive deficits, but is a more risky than someone should be with left sided weakness. Fiance states he is "just stubborn".   Physical Exam: Blood pressure (!) 171/101, pulse 95, temperature 98 F (36.7 C), resp. rate 16, height 6' (1.829 m), weight 118 kg, SpO2 96 %. Physical Exam  General: No acute distress Mood and affect are appropriate Heart: Regular rate and rhythm no rubs murmurs or extra sounds Lungs: Clear to auscultation, breathing unlabored, no rales or wheezes Abdomen: Positive bowel sounds, soft nontender to palpation, nondistended Extremities: No clubbing, cyanosis, or edema Skin: No evidence of breakdown, no evidence of rash Neurologic: Cranial nerves II through XII intact, motor strength is 5/5 in right  deltoid, bicep, tricep, grip, hip flexor, knee extensors, ankle dorsiflexor and plantar flexor 3- left delt , bi, tri, grip, 4/5 Left HF, KE ADF Sensory exam normal sensation to light touch in UE although pt has some parasthesias Cerebellar exam not tested due to weakness on left  Musculoskeletal: Full range of motion in all 4 extremities. No joint swelling     Lab Results Last 48 Hours  No results found for this or any previous visit (from the past 48 hour(s)).    Imaging Results (Last 48 hours)  No results found.          Blood pressure (!) 171/101, pulse 95, temperature 98 F (36.7 C), resp. rate 16, height 6' (1.829 m), weight 118 kg, SpO2 96 %.   Medical Problem List and Plan: 1. Functional deficits secondary to RIght subcortical infarct             -patient may  shower             -ELOS/Goals: 5-7 d Mod I 2.  Antithrombotics: -DVT/anticoagulation:  Pharmaceutical: Lovenox             -antiplatelet therapy: DAPT X 3 weeks followed by Plavix alone starting 03/08 3. Pain Management: Tylenol prn. Baclofen TID prn for issues with back pain.  4. Mood/Behavior/Sleep:               -antipsychotic agents: N/A             --Restoril prn for sleep.  5. Neuropsych/cognition: This patient is capable of making decisions on his own behalf. 6. Skin/Wound Care: Routine pressure relief measures.  7. Fluids/Electrolytes/Nutrition: Monitor I/O. Check CMET in am.  8. HTN: Does not have PCP/used meds prn. Will monitor BP TID --Continue  Avapro and HCTZ  (were added 02/19). Amlodipine resumed on 02/18.             --Avoid hypoperfusion. Titrate as indicated.  Vitals:   07/04/22 1616 07/05/22 0421  BP: (!) 173/115 (!) 149/92  Pulse: 98 88  Resp: 18 18  Temp: 97.7 F (36.5 C) 97.8 F (36.6 C)  SpO2: 99% 96%  May need prn med for diastolic [id number]   9. Elevated Lipids: Trig-161. HDL-35-->Now on Lipitor. 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 Restoril prn effective 11. Tobacco use: Continue nicotine patch.  13.  Impaired fasting blood sugar: Hgb A1C- 5.9.   14.CKD?: SCr 1.37 at admission Recheck CMET in am.                  Latest Ref Rng & Units 07/05/2022    5:14 AM 07/04/2022    6:18 PM 06/30/2022    4:24 PM  BMP  Glucose 70 - 99 mg/dL 197     BUN 6 - 20 mg/dL 19     Creatinine 0.61 - 1.24 mg/dL 1.60  1.58  1.29   Sodium 135 - 145 mmol/L 137     Potassium 3.5 - 5.1 mmol/L 3.3     Chloride 98 - 111 mmol/L 101     CO2 22 -  32 mmol/L 27     Calcium 8.9 - 10.3 mg/dL 9.2      Creat may be creeping up due to HCTZ, also K+ is low will supplement and monitor I/O May need to consider other antihypertesives such as CCB and BB    LOS: 1 days A FACE TO FACE EVALUATION WAS PERFORMED  Charlett Blake 07/05/2022, 8:01 AM

## 2022-07-05 NOTE — Progress Notes (Signed)
[facility] Individual Statement of Services  Patient Name:  Jonathan [name]  Date:  07/05/2022  Welcome to the [facility].  Our goal is to provide you with an individualized program based on your diagnosis and situation, designed to meet your specific needs.  With this comprehensive rehabilitation program, you will be expected to participate in at least 3 hours of rehabilitation therapies Monday-Friday, with modified therapy programming on the weekends.  Your rehabilitation program will include the following services:  Physical Therapy (PT), Occupational Therapy (OT), Speech Therapy (ST), 24 hour per day rehabilitation nursing, Therapeutic Recreaction (TR), Neuropsychology, Care Coordinator, Rehabilitation Medicine, Nutrition Services, Pharmacy Services, and Other  Weekly team conferences will be held on Wednesdays to discuss your progress.  Your Inpatient Rehabilitation Care Coordinator will talk with you frequently to get your input and to update you on team discussions.  Team conferences with you and your family in attendance may also be held.  Expected length of stay: 5-7 Days  Overall anticipated outcome:  MOD I  Depending on your progress and recovery, your program may change. Your Inpatient Rehabilitation Care Coordinator will coordinate services and will keep you informed of any changes. Your Inpatient Rehabilitation Care Coordinator's name and contact numbers are listed  below.  The following services may also be recommended but are not provided by the [facility]:   [facility] will be made to provide these services after discharge if needed.  Arrangements include referral to agencies that provide these services.  Your insurance has been verified to be:  [place]  Your primary doctor is:  Larina Earthly, MD  Pertinent information will be shared  with your doctor and your insurance company.  Inpatient Rehabilitation Care Coordinator:  Erlene Quan, [place] or [phone]  Information discussed with and copy given to patient by: Dyanne Iha, 07/05/2022, 9:50 AM

## 2022-07-05 NOTE — Evaluation (Signed)
Physical Therapy Assessment and Plan  Patient Details  Name: Jonathan [name] MRN: [id number] Date of Birth: 1976-04-07  PT Diagnosis: Abnormal posture, Abnormality of gait, Coordination disorder, Difficulty walking, and Hemiplegia non-dominant Rehab Potential: Good ELOS: 3-5 days   Today's Date: 07/05/2022 PT Individual Time: [id number] PT Individual Time Calculation (min): 69 min    Hospital Problem: Principal Problem:   Right basal ganglia embolic stroke [facility])   Past Medical History:  Past Medical History:  Diagnosis Date   Hypertension    Lower back pain    Past Surgical History: History reviewed. No pertinent surgical history.  Assessment & Plan Clinical Impression: Patient is a 47 y.o. year old male with history of HTN (but intermittent med use), tobacco use who was admitted on 06/30/22 with left arm and leg weakness which started the night before. CTA head/neck was negative for LVO. UDS positive for cocaine.  MRI brain showed small acute infarct in white matter tracks in right basal ganglia and overlying frontal lobe.  2D echo showed EF 55-60% with no wall abnormality and AV sclerosis. Dr. 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 He recommended DAPT X 3 weeks followed by Plavix alone as well as participation in sleep smart study given risk of sleep apnea. Therapy has been working with patient who continues to be limited by LLE weakness with knee instability, LUE weakness with sensory deficits and decreased in safety awareness. CIR recommended due to functional decline.     Patient currently requires  CGA  with mobility secondary to muscle weakness, decreased cardiorespiratoy endurance, impaired timing and sequencing, unbalanced muscle activation, decreased coordination, and decreased motor planning, decreased motor planning, and decreased standing balance, decreased postural control, hemiplegia, and decreased balance strategies.  Prior to  hospitalization, patient was independent  with mobility and lived with Significant other, Daughter (fiance) in a House home.  Home access is 4Stairs to enter.  Patient will benefit from skilled PT intervention to maximize safe functional mobility, minimize fall risk, and decrease caregiver burden for planned discharge home with intermittent assist.  Anticipate patient will benefit from follow up OP at discharge.  PT - End of Session Activity Tolerance: Tolerates 30+ min activity with multiple rests Endurance Deficit: Yes Endurance Deficit Description: required multiple rest breaks PT Assessment Rehab Potential (ACUTE/IP ONLY): Good PT Barriers to Discharge: Decreased caregiver support;Inaccessible home environment;Home environment access/layout;Lack of/limited family support;Other (comments) PT Barriers to Discharge Comments: decreased insight into defictis, 4 STE with no rails, 3 steps to get in/out of bed with no rail, fiance works during the day PT Patient demonstrates impairments in the following area(s): Balance;Endurance;Motor;Perception;Safety;Sensory PT Transfers Functional Problem(s): Bed Mobility;Bed to Chair;Car;Furniture PT Locomotion Functional Problem(s): Ambulation;Stairs PT Plan PT Intensity: Minimum of 1-2 x/day ,45 to 90 minutes PT Frequency: 5 out of 7 days PT Duration Estimated Length of Stay: 3-5 days PT Treatment/Interventions: Ambulation/gait training;Balance/vestibular training;Community reintegration;Discharge planning;DME/adaptive equipment instruction;Neuromuscular re-education;Functional mobility training;Patient/family education;Therapeutic Exercise;Therapeutic Activities;Stair training;UE/LE Strength taining/ROM;Disease management/prevention PT Transfers Anticipated Outcome(s): Mod I with LRAD PT Locomotion Anticipated Outcome(s): Mod I with LRAD PT Recommendation Follow Up Recommendations: Outpatient PT Patient destination: Home Equipment Recommended: To be  determined Equipment Details: pt reports he would be willing to obtain own cane if needed at D/C   PT Evaluation Precautions/Restrictions Precautions Precautions: Fall Precaution Comments: L hemi Restrictions Weight Bearing Restrictions: No Pain Interference Pain Interference Pain Effect on Sleep: 0. Does not apply - I have not had any pain or hurting in  the past 5 days Pain Interference with Therapy Activities: 0. Does not apply - I have not received rehabilitationtherapy in the past 5 days Pain Interference with Day-to-Day Activities: 1. Rarely or not at all Home Living/Prior [facility] Available Help at Discharge: Family;Available PRN/intermittently (fiance works night shift (5AM-5PM, 4 days on/off), daughter is 50) Type of Home: House Home Access: Stairs to enter [employer] of Steps: 4 Entrance Stairs-Rails: None Home Layout: One level Bathroom Shower/Tub: Product/process development scientist: Handicapped height Bathroom Accessibility: No Additional Comments: fiance works night shift  Lives With: Teaching laboratory technician (fiance) Prior Function Level of Independence: Independent with basic ADLs;Independent with gait;Independent with transfers;Independent with homemaking with ambulation  Able to Take Stairs?: Yes Driving: Yes Vocation: Part time employment 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 Vision/Perception  Vision - History Ability to See in Adequate Light: 0 Adequate Perception Perception: Within Functional Limits Praxis Praxis: Intact  Cognition Overall Cognitive Status: Within Functional Limits for tasks assessed Arousal/Alertness: Awake/alert Orientation Level: Oriented X4 Memory: Appears intact Awareness: Impaired Problem Solving: Appears intact Safety/Judgment: Impaired Comments: decreased awareness to functional deficits Sensation Sensation Light Touch: Impaired Detail Proprioception: Appears Intact Additional  Comments: decreased sensation to light touch lower LLE>RLE, numbness/tingling on L great toe, R thigh, and L hand all digits except thumb Coordination Gross Motor Movements are Fluid and Coordinated: No Fine Motor Movements are Fluid and Coordinated: No Coordination and Movement Description: mild uncoordinated due to L hemi, weakness, decreased endurance, decreased sensation Finger Nose Finger Test: slow, decreased ROM, and dysmetria on LUE Heel Shin Test: slower LLE>RLE Motor  Motor Motor: Hemiplegia Motor - Skilled Clinical Observations: mild uncoordinated due to L hemi, weakness, decreased endurance, decreased sensation  Trunk/Postural Assessment  Cervical Assessment Cervical Assessment: Within Functional Limits Thoracic Assessment Thoracic Assessment: Within Functional Limits Lumbar Assessment Lumbar Assessment: Within Functional Limits Postural Control Postural Control: Deficits on evaluation Protective Responses: reiled on external support with high level gait  Balance Balance Balance Assessed: Yes Standardized Balance Assessment Standardized Balance Assessment: Functional Gait Assessment;Timed Up and Go Test Timed Up and Go Test TUG: Normal TUG Normal TUG (seconds): 11 Static Sitting Balance Static Sitting - Balance Support: Feet supported;No upper extremity supported Static Sitting - Level of Assistance: 7: Independent Dynamic Sitting Balance Dynamic Sitting - Balance Support: Feet supported;No upper extremity supported Dynamic Sitting - Level of Assistance: 7: Independent Sitting balance - Comments: activites within BOS Static Standing Balance Static Standing - Balance Support: No upper extremity supported Static Standing - Level of Assistance: 5: Stand by assistance (CGA) Dynamic Standing Balance Dynamic Standing - Balance Support: No upper extremity supported Dynamic Standing - Level of Assistance: 5: Stand by assistance (CGA) Dynamic Standing - Comments: self  care washing face/hands, toileting, transfers, and gait Functional Gait  Assessment Gait assessed : Yes Gait Level Surface: Walks 20 ft in less than 7 sec but greater than 5.5 sec, uses assistive device, slower speed, mild gait deviations, or deviates 6-10 in outside of the 12 in walkway width. Change in Gait Speed: Able to change speed, demonstrates mild gait deviations, deviates 6-10 in outside of the 12 in walkway width, or no gait deviations, unable to achieve a major change in velocity, or uses a change in velocity, or uses an assistive device. Gait with Horizontal Head Turns: Performs head turns smoothly with slight change in gait velocity (eg, minor disruption to smooth gait path), deviates 6-10 in outside 12 in walkway width, or uses an assistive device. Gait with Vertical Head  Turns: Performs task with slight change in gait velocity (eg, minor disruption to smooth gait path), deviates 6 - 10 in outside 12 in walkway width or uses assistive device Gait and Pivot Turn: Pivot turns safely in greater than 3 sec and stops with no loss of balance, or pivot turns safely within 3 sec and stops with mild imbalance, requires small steps to catch balance. Step Over Obstacle: Is able to step over one shoe box (4.5 in total height) but must slow down and adjust steps to clear box safely. May require verbal cueing. Gait with Narrow Base of Support: Ambulates 4-7 steps. Gait with Eyes Closed: Walks 20 ft, slow speed, abnormal gait pattern, evidence for imbalance, deviates 10-15 in outside 12 in walkway width. Requires more than 9 sec to ambulate 20 ft. Ambulating Backwards: Walks 20 ft, uses assistive device, slower speed, mild gait deviations, deviates 6-10 in outside 12 in walkway width. Steps: Two feet to a stair, must use rail. Total Score: 16 Extremity Assessment  RLE Assessment RLE Assessment: Exceptions to [facility] General Strength Comments: tested sitting EOB RLE Strength Right Hip Flexion:  4-/5 Right Hip ABduction: 4/5 Right Hip ADduction: 4/5 Right Knee Flexion: 4/5 Right Knee Extension: 4-/5 Right Ankle Dorsiflexion: 4/5 Right Ankle Plantar Flexion: 4/5 LLE Assessment LLE Assessment: Exceptions to [facility] General Strength Comments: tested sitting EOB LLE Strength Left Hip Flexion: 4-/5 Left Hip ABduction: 4-/5 Left Hip ADduction: 4-/5 Left Knee Flexion: 4-/5 Left Knee Extension: 4-/5 Left Ankle Dorsiflexion: 4-/5 Left Ankle Plantar Flexion: 4-/5  Care Tool Care Tool Bed Mobility Roll left and right activity   Roll left and right assist level: Independent    Sit to lying activity   Sit to lying assist level: Independent with assistive device Sit to lying assistive device comment: increased time  Lying to sitting on side of bed activity   Lying to sitting on side of bed assist level: the ability to move from lying on the back to sitting on the side of the bed with no back support.: Independent with assistive device Lying to sitting on side of bed assist device comment: the ability to move from lying on the back to sitting on the side of the bed with no back support.: increased time   Care Tool Transfers Sit to stand transfer   Sit to stand assist level: Contact Guard/Touching assist    Chair/bed transfer   Chair/bed transfer assist level: Contact Guard/Touching assist     Toilet transfer   Assist Level: Contact Guard/Touching assist    Car transfer   Car transfer assist level: Contact Guard/Touching assist      Care Tool Locomotion Ambulation   Assist level: Contact Guard/Touching assist Assistive device: No Device Max distance: 135f  Walk 10 feet activity   Assist level: Contact Guard/Touching assist Assistive device: No Device   Walk 50 feet with 2 turns activity   Assist level: Contact Guard/Touching assist Assistive device: No Device  Walk 150 feet activity   Assist level: Contact Guard/Touching assist Assistive device: No Device  Walk 10  feet on uneven surfaces activity   Assist level: Contact Guard/Touching assist Assistive device: Other (comment) (no device)  Stairs   Assist level: Minimal Assistance - Patient > 75% Stairs assistive device: 1 hand rail Max number of stairs: 12 (6in)  Walk up/down 1 step activity   Walk up/down 1 step (curb) assist level: Minimal Assistance - Patient > 75% Walk up/down 1 step or curb assistive device: 1 hand  rail  Walk up/down 4 steps activity   Walk up/down 4 steps assist level: Minimal Assistance - Patient > 75% Walk up/down 4 steps assistive device: 1 hand rail  Walk up/down 12 steps activity   Walk up/down 12 steps assist level: Minimal Assistance - Patient > 75% Walk up/down 12 steps assistive device: 1 hand rail  Pick up small objects from floor   Pick up small object from the floor assist level: Contact Guard/Touching assist Pick up small object from the floor assistive device: clothespin without AD  Wheelchair Is the patient using a wheelchair?: No   Wheelchair activity did not occur: N/A      Wheel 50 feet with 2 turns activity Wheelchair 50 feet with 2 turns activity did not occur: N/A    Wheel 150 feet activity Wheelchair 150 feet activity did not occur: N/A      Refer to Care Plan for Long Term Goals  SHORT TERM GOAL WEEK 1 PT Short Term Goal 1 (Week 1): STG=LTG due to LOS  Recommendations for other services: None   Skilled Therapeutic Intervention Evaluation completed (see details above and below) with education on PT POC and goals and individual treatment initiated with focus on functional mobility/transfers, generalized strengthening and endurance, dynamic standing balance/coordination, simulated car transfers, stair navigation, and ambulation. Received pt semi-reclined in bed, pt educated on PT evaluation, CIR policies, and therapy schedule and agreeable. Pt denied any pain during session. Pt transferred semi-reclined<>sitting EOB with HOB elevated and mod I. Pt  performed all transfers without AD and CGA throughout session. Pt ambulated around room without AD and CGA and gathered supplies to wash face standing at sink with CGA. Pt ambulated 34f without AD and CGA to ortho gym and practiced ambulatory simulated car transfer without AD and CGA entering via side stepping. Pt then ambulated 168fon uneven surfaces (ramp and mulch) without AD and CGA and descended 1 6in curb with R handrail and CGA. Pt then ambulated [id number] AD and CGA to main therapy gym and participated in TUG and FGA (see below):  - TUG without AD and CGA with average of 11 seconds.  - Patient demonstrates increased fall risk as noted by score of 16/30 on  Functional Gait Assessment.   <22/30 = predictive of falls, <20/30 = fall in 6 months, <18/30 = predictive of falls in PD MCID: 5 points stroke population, 4 points geriatric population (ANPTA Core Set of Outcome Measures for Adults with Neurologic Conditions, 2018)  Pt navigated 12 6in steps with R handrail and min A alternating ascending and descending with a step through/step to pattern. Ambulated [id number] AD and CGA to rehab apartment and practiced furniture transfers on/off low sitting couch and recliner without AD and CGA/close supervision and performed bed mobility from regular bed mod I. Pt reports having very high bed with 3 steps leading up to it with no rails. Ambulated [id number] AD and CGA back to room. Concluded session with pt sitting EOB, needs within reach, and bed alarm on. Safety plan updated.   Of note, pt required multiple rest breaks throughout session due to fatigue.   Mobility Bed Mobility Bed Mobility: Rolling Right;Rolling Left;Sit to Supine;Supine to Sit Rolling Right: Independent Rolling Left: Independent Supine to Sit: Independent with assistive device (increased time) Sit to Supine: Independent with assistive device (increased time) Transfers Transfers: Sit to Stand;Stand to Sit;Stand Pivot  Transfers Sit to Stand: Contact Guard/Touching assist Stand to Sit: Contact Guard/Touching assist Stand Pivot Transfers:  Contact Guard/Touching assist Transfer (Assistive device): None Locomotion  Gait Ambulation: Yes Gait Assistance: Contact Guard/Touching assist Gait Distance (Feet): 150 Feet Assistive device: None Gait Gait: Yes Gait Pattern: Impaired Gait Pattern: Step-through pattern;Left circumduction;Decreased step length - left;Poor foot clearance - left;Decreased weight shift to left;Lateral hip instability Gait velocity: decreased High Level Ambulation High Level Ambulation: Backwards walking;Direction changes;Sudden stops;Head turns Backwards Walking: see FGA Direction Changes: see FGA Sudden Stops: see FGA Head Turns: see FGA Stairs / Additional Locomotion Stairs: Yes Stairs Assistance: Minimal Assistance - Patient > 75% Stair Management Technique: One rail Right Number of Stairs: 12 Height of Stairs: 6 Ramp: Contact Guard/touching assist Curb: Contact Guard/Touching assist (R handrail) Wheelchair Mobility Wheelchair Mobility: No   Discharge Criteria: Patient will be discharged from PT if patient refuses treatment 3 consecutive times without medical reason, if treatment goals not met, if there is a change in medical status, if patient makes no progress towards goals or if patient is discharged from hospital.  The above assessment, treatment plan, treatment alternatives and goals were discussed and mutually agreed upon: by patient  Alfonse Alpers PT, DPT  07/05/2022, 10:04 AM

## 2022-07-05 NOTE — Evaluation (Signed)
Speech Language Pathology Assessment and Plan  Patient Details  Name: Jonathan [name] MRN: [id number] Date of Birth: [birth year]  SLP Diagnosis: Cognitive Impairments  Rehab Potential: Excellent ELOS: 3-5 days    Today's Date: 07/05/2022 SLP Individual Time: 1335-1420 SLP Individual Time Calculation (min): 45 min   Hospital Problem: Principal Problem:   Right basal ganglia embolic stroke [facility])  Past Medical History:  Past Medical History:  Diagnosis Date   Hypertension    Lower back pain    Past Surgical History: History reviewed. No pertinent surgical history.  Assessment / Plan / Recommendation Clinical Impression Patient is a 47 year old male with history of HTN (but intermittent med use), tobacco use who was admitted on 06/30/22 with left arm and leg weakness which started the night before. CTA head/neck was negative for LVO. UDS positive for cocaine.  MRI brain showed small acute infarct in white matter tracks in right basal ganglia and overlying frontal lobe.  Therapy evaluations completed with recommendations for CIR. Patient admitted 07/04/22.  Upon arrival, patient was awake in bed and agreeable to treatment session. Patient requested for SLP to turn off the bed alarm so he could use the bathroom. Patient initially declined need for gait belt but then eventually agreeable. Patient ambulated to the bathroom with mild impulsivity with movement and left inattention noted as he frequently ran into the door jam. Patient was continent of bladder. Patient participated in the cognistat and scored WFL on all subtests with the exception of mild deficits in memory. However, suspect function impacted by his attempt to divide his attention between cognitive assessment and performing exercises with his LUE. Patient also asking appropriate questions regarding stroke and could benefit from ongoing education. Patient would benefit from skilled SLP intervention to maximize his cognitive  functioning and overall functional independence prior to discharge.      Skilled Therapeutic Interventions          Administered a cognitive-linguistic evaluation, please see above for details.   SLP Assessment  Patient will need skilled [facility] during CIR admission    Recommendations  Oral Care Recommendations: Oral care BID Recommendations for Other Services: Neuropsych consult Patient destination: Home Follow up Recommendations:  (Intermittent supervision) Equipment Recommended: None recommended by SLP    SLP Frequency 3 to 5 out of 7 days   SLP Duration  SLP Intensity  SLP Treatment/Interventions 3-5 days  Minumum of 1-2 x/day, 30 to 90 minutes  Cognitive remediation/compensation;Cueing hierarchy;Environmental controls;Functional tasks;Internal/external aids;Patient/family education    Pain Pain Assessment Pain Scale: 0-10 Pain Score: 0-No pain  SLP Evaluation Cognition Overall Cognitive Status: Impaired/Different from baseline Arousal/Alertness: Awake/alert Orientation Level: Oriented X4 Attention: Divided Divided Attention: Impaired Divided Attention Impairment: Verbal complex Memory: Impaired Memory Impairment: Decreased recall of new information;Decreased short term memory Awareness: Impaired Awareness Impairment: Emergent impairment Problem Solving: Appears intact Safety/Judgment: Impaired  Comprehension Auditory Comprehension Overall Auditory Comprehension: Appears within functional limits for tasks assessed Expression Expression Primary Mode of Expression: Verbal Verbal Expression Overall Verbal Expression: Appears within functional limits for tasks assessed Written Expression Dominant Hand: Right Oral Motor Oral Motor/Sensory Function Overall Oral Motor/Sensory Function: Within functional limits Motor Speech Overall Motor Speech: Appears within functional limits for tasks assessed  Care Tool Care Tool  Cognition Ability to hear (with hearing aid or hearing appliances if normally used Ability to hear (with hearing aid or hearing appliances if normally used): 0. Adequate - no difficulty in normal conservation, social interaction, listening to TV  Expression of Ideas and Wants Expression of Ideas and Wants: 4. Without difficulty (complex and basic) - expresses complex messages without difficulty and with speech that is clear and easy to understand   Understanding Verbal and Non-Verbal Content Understanding Verbal and Non-Verbal Content: 4. Understands (complex and basic) - clear comprehension without cues or repetitions  Memory/Recall Ability Memory/Recall Ability : Current season;That he or she is in a hospital/hospital unit    Short Term Goals: Week 1: SLP Short Term Goal 1 (Week 1): STGs=LTGs due to ELOS  Refer to Care Plan for Long Term Goals  Recommendations for other services: None  Discharge Criteria: Patient will be discharged from SLP if patient refuses treatment 3 consecutive times without medical reason, if treatment goals not met, if there is a change in medical status, if patient makes no progress towards goals or if patient is discharged from hospital.  The above assessment, treatment plan, treatment alternatives and goals were discussed and mutually agreed upon: by patient  Eleena Grater 07/05/2022, 3:52 PM

## 2022-07-05 NOTE — Progress Notes (Signed)
Inpatient Rehabilitation Care Coordinator Assessment and Plan Patient Details  Name: Jonathan [name] MRN: [id number] Date of Birth: Jul 13, 1975  Today's Date: 07/05/2022  Hospital Problems: Principal Problem:   Right basal ganglia embolic stroke [facility])  Past Medical History:  Past Medical History:  Diagnosis Date   Hypertension    Lower back pain    Past Surgical History: History reviewed. No pertinent surgical history. Social History:  reports that he has been smoking cigarettes. He has been smoking an average of 1 pack per day. He has never used smokeless tobacco. He reports current alcohol use. He reports current drug use. Drug: Marijuana.  Family / Support Systems Spouse/Significant Other: Fiance, Jonathan [name] Children: Daughter, 72 Y.O Other Supports: Sister, Jonathan [name] Anticipated Caregiver: Jonathan [name] Ability/Limitations of Caregiver: Able to provide up to Min/Mod but works 12 hour night shifts, MOD I GOALS Caregiver Availability: Intermittent Family Dynamics: support from S.O., daughter and sister  Social History Preferred language: English Religion: None Cultural Background: Patient worked, independent and caring for daughter Education: [place] - How often do you need to have someone help you when you read instructions, pamphlets, or other written material from your doctor or pharmacy?: Never Writes: Yes Employment Status: Employed Return to Work Plans: TBD Public relations account executive Issues: N/A Guardian/Conservator: N/A   Abuse/Neglect Abuse/Neglect Assessment Can Be Completed: Yes Physical Abuse: Denies Verbal Abuse: Denies Sexual Abuse: Denies Exploitation of patient/patient's resources: Denies Self-Neglect: Denies  Patient response to: Social Isolation - How often do you feel lonely or isolated from those around you?: Never  Emotional Status Pt's affect, behavior and adjustment status: Pleasant Recent Psychosocial Issues: Coping Psychiatric History:  N/A Substance Abuse History: tobacco abuse of 1/2 pack/day  Patient / Family Perceptions, Expectations & Goals Pt/Family understanding of illness & functional limitations: yes Premorbid pt/family roles/activities: Independent overall and working Anticipated changes in roles/activities/participation: Antcipates MOD I goals with finace to provide intermittent assistance Pt/family expectations/goals: MOD I (Due to fiance working at night)  [employer]: None Premorbid Home Care/DME Agencies: None Transportation available at discharge: [place] or sister able to transport to appointments Is the patient able to respond to transportation needs?: Yes In the past 12 months, has lack of transportation kept you from medical appointments or from getting medications?: No In the past 12 months, has lack of transportation kept you from meetings, work, or from getting things needed for daily living?: No Resource referrals recommended: Neuropsychology  Discharge Planning Living Arrangements: Spouse/significant other, Children Support Systems: Spouse/significant other Type of Residence: Private residence (1 level home 3-4 steps to enter) Administrator, sports: Multimedia programmer (specify) Psychologist, counselling) Financial Screen Referred: Yes Living Expenses: Education officer, community Management: Patient, Significant Other Does the patient have any problems obtaining your medications?: No Home Management: Independent Patient/Family Preliminary Plans: Patient plans to manage, if unable S.O or family able to assist Care Coordinator Barriers to Discharge: Insurance for SNF coverage, Decreased caregiver support, Lack of/limited family support Care Coordinator Anticipated Follow Up Needs: HH/OP Expected length of stay: 5-7 Days  Clinical Impression SW met with patient, introduced self and explained role. Patient anticipates discharging back home with his fiance, Jonathan [name]. Jonathan [name] works 12 hour shifts at night and both are  anticipating MOD I goals due to this. Patients 57 year old daughter will be in the home at night. Patient has no DME. Patient requesting to be set up with a PCP, no additional questions or concerns.  Dyanne Iha 07/05/2022, 1:25 PM

## 2022-07-06 DIAGNOSIS — I639 Cerebral infarction, unspecified: Secondary | ICD-10-CM | POA: Diagnosis not present

## 2022-07-06 MED ORDER — GABAPENTIN 100 MG PO CAPS
100.0000 mg | ORAL_CAPSULE | Freq: Every day | ORAL | Status: DC
  Administered 2022-07-06 – 2022-07-07 (×2): 100 mg via ORAL
  Filled 2022-07-06 (×2): qty 1

## 2022-07-06 NOTE — Progress Notes (Signed)
PROGRESS NOTE   Subjective/Complaints:  No issues overnite, pt feeling ok   ROS- neg SOB, CP Objective:   No results found. Recent Labs    07/05/22 0514  WBC 6.2  HGB 13.9  HCT 39.8  PLT 232   Recent Labs    07/04/22 1818 07/05/22 0514  NA  --  137  K  --  3.3*  CL  --  101  CO2  --  27  GLUCOSE  --  197*  BUN  --  19  CREATININE 1.58* 1.60*  CALCIUM  --  9.2    Intake/Output Summary (Last 24 hours) at 07/06/2022 0800 Last data filed at 07/05/2022 0830 Gross per 24 hour  Intake 240 ml  Output --  Net 240 ml        Physical Exam: Vital Signs Blood pressure (!) 142/96, pulse (!) 104, temperature 98.3 F (36.8 C), temperature source Oral, resp. rate 18, height 5' 11"$  (1.803 m), weight 122 kg, SpO2 96 %.   General: No acute distress Mood and affect are appropriate Heart: Regular rate and rhythm no rubs murmurs or extra sounds Lungs: Clear to auscultation, breathing unlabored, no rales or wheezes Abdomen: Positive bowel sounds, soft nontender to palpation, nondistended Extremities: No clubbing, cyanosis, or edema Skin: No evidence of breakdown, no evidence of rash Neurologic: Cranial nerves II through XII intact, motor strength is 5/5 in bilateral deltoid, bicep, tricep, grip, hip flexor, knee extensors, ankle dorsiflexor and plantar flexor Sensory exam parasthesia Left hemifacial and left hand mainly 4th and 5th digits  Cerebellar exam dysmetria vs weakness LUE FNF Musculoskeletal: Full range of motion in all 4 extremities. No joint swelling   Assessment/Plan: 1. Functional deficits which require 3+ hours per day of interdisciplinary therapy in a comprehensive inpatient rehab setting. Physiatrist is providing close team supervision and 24 hour management of active medical problems listed below. Physiatrist and rehab team continue to assess barriers to discharge/monitor patient progress toward  functional and medical goals  Care Tool:  Bathing    Body parts bathed by patient: Right arm, Left arm, Chest, Abdomen, Front perineal area, Right upper leg, Buttocks, Left upper leg, Right lower leg, Left lower leg, Face         Bathing assist Assist Level: Contact Guard/Touching assist     Upper Body Dressing/Undressing Upper body dressing   What is the patient wearing?: Pull over shirt    Upper body assist Assist Level: Contact Guard/Touching assist (standing)    Lower Body Dressing/Undressing Lower body dressing      What is the patient wearing?: Pants     Lower body assist Assist for lower body dressing: Minimal Assistance - Patient > 75% (standing)     Toileting Toileting    Toileting assist Assist for toileting: Supervision/Verbal cueing (standing)     Transfers Chair/bed transfer  Transfers assist     Chair/bed transfer assist level: Contact Guard/Touching assist     Locomotion Ambulation   Ambulation assist      Assist level: Contact Guard/Touching assist Assistive device: No Device Max distance: 162f   Walk 10 feet activity   Assist     Assist level: Contact  Guard/Touching assist Assistive device: No Device   Walk 50 feet activity   Assist    Assist level: Contact Guard/Touching assist Assistive device: No Device    Walk 150 feet activity   Assist    Assist level: Contact Guard/Touching assist Assistive device: No Device    Walk 10 feet on uneven surface  activity   Assist     Assist level: Contact Guard/Touching assist Assistive device: Other (comment) (no device)   Wheelchair     Assist Is the patient using a wheelchair?: No   Wheelchair activity did not occur: N/A         Wheelchair 50 feet with 2 turns activity    Assist    Wheelchair 50 feet with 2 turns activity did not occur: N/A       Wheelchair 150 feet activity     Assist  Wheelchair 150 feet activity did not occur:  N/A       Blood pressure (!) 142/96, pulse (!) 104, temperature 98.3 F (36.8 C), temperature source Oral, resp. rate 18, height 5' 11"$  (1.803 m), weight 122 kg, SpO2 96 %.  Medical Problem List and Plan: 1. Functional deficits secondary to RIght subcortical infarct             -patient may  shower             -ELOS/Goals: 5-7 d Mod I 2.  Antithrombotics: -DVT/anticoagulation:  Pharmaceutical: Lovenox             -antiplatelet therapy: DAPT X 3 weeks followed by Plavix alone starting 03/08 3. Pain Management: Tylenol prn. Baclofen TID prn for issues with back pain.  4. Mood/Behavior/Sleep:               -antipsychotic agents: N/A             --Restoril prn for sleep.  5. Neuropsych/cognition: This patient is capable of making decisions on his own behalf. 6. Skin/Wound Care: Routine pressure relief measures.  7. Fluids/Electrolytes/Nutrition: Monitor I/O. Check CMET in am.  8. HTN: Does not have PCP/used meds prn. Will monitor BP TID --Continue  Avapro and HCTZ (were added 02/19). Amlodipine resumed on 02/18.             --Avoid hypoperfusion. Titrate as indicated.      Vitals:    07/04/22 1616 07/05/22 0421  BP: (!) 173/115 (!) 149/92  Pulse: 98 88  Resp: 18 18  Temp: 97.7 F (36.5 C) 97.8 F (36.6 C)  SpO2: 99% 96%  May need prn med for diastolic [id number]    9. Elevated Lipids: Trig-161. HDL-35-->Now on Lipitor. 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 Restoril prn effective 11. Tobacco use: Continue nicotine patch.  13.  Impaired fasting blood sugar: Hgb A1C- 5.9.   14.CKD?: SCr 1.37 at admission Recheck CMET in am.                    Latest Ref Rng & Units 07/05/2022    5:14 AM 07/04/2022    6:18 PM 06/30/2022    4:24 PM  BMP  Glucose 70 - 99 mg/dL 197       BUN 6 - 20 mg/dL 19       Creatinine 0.61 - 1.24 mg/dL 1.60  1.58  1.29   Sodium 135 - 145 mmol/L 137       Potassium 3.5 - 5.1 mmol/L 3.3       Chloride 98 -  111 mmol/L 101        CO2 22 - 32 mmol/L 27       Calcium 8.9 - 10.3 mg/dL 9.2        Creat may be creeping up due to HCTZ, also K+ is low will supplement and monitor I/O- recheck in am  If this remains elevated, may need to consider other antihypertesives such as CCB and BB     LOS: 1 days A FACE TO FACE EVALUATION WAS PERFORMED   Charlett Blake 07/05/2022, 8:01 AM     LOS: 2 days A FACE TO FACE EVALUATION WAS PERFORMED  Charlett Blake 07/06/2022, 8:00 AM

## 2022-07-06 NOTE — Progress Notes (Signed)
Physical Therapy Session Note  Patient Details  Name: Jonathan [name] MRN: [id number] Date of Birth: 01-07-1976  Today's Date: 07/06/2022 PT Individual Time: [id number] PT Individual Time Calculation (min): 68 min   Short Term Goals: Week 1:  PT Short Term Goal 1 (Week 1): STG=LTG due to LOS   Skilled Therapeutic Interventions/Progress Updates:  Session 1  Pt received supine in bed and agreeable to PT. Supine>sit transfer without assist or cues from PT. Pt requesting to finish drink from breakfast, and able to open cup without assist from PT.   Gait training in hall with no AD and distant supervision assist from PT.   Nustep level 6 x 6 min and level 10 x 3 min   Tall kneeling with UE supported on stoll.   Education on HHPT vs OPPT. Pt requesting OPPT given current functional level and therapy  Dyanmic balance training to perform foot taps on 6inch step and 6 inch  cone x 10 each forward   Pt left with OT for additional therapy session.   Session 2.   Pt received supine in bed and agreeable to PT. Supine>sit transfer with min assist from PT at trunk per pt request.   Gait training with SPC through hall with distant supervision assist from PT with cues for AD management intermittently.   Dynamic gait/balance training on agility ladder: 1 foot in each. X 2  Side stepping x 2 bil SLS with 3 sec hold x 4  2 feet in/out x 2   Up.down 6inch step with BUE supported o bil rails. X 10 BLE          Therapy Documentation Precautions:  Precautions Precautions: Fall Precaution Comments: L hemi Restrictions Weight Bearing Restrictions: No General:   Vital Signs:  Pain: Pain Assessment Pain Score: 0-No pain Mobility:   Locomotion :    Trunk/Postural Assessment :    Balance:   Exercises:   Other Treatments:      Therapy/Group: Individual Therapy  Lorie Phenix 07/06/2022, 11:30 AM

## 2022-07-06 NOTE — Progress Notes (Signed)
Patient ID: Jonathan [name], male   DOB: July 27, 1975, 47 y.o.   MRN: [id number]  Team Conference Report to Patient/Family  Team Conference discussion was reviewed with the patient and caregiver, including goals, any changes in plan of care and target discharge date.  Patient and caregiver express understanding and are in agreement.  The patient has a target discharge date of 07/08/22.  Sw met with and and spoke with fiance via telephone at bedside. Fiance has request to have a form completed for the department of social services to medically support the need for his child to receive daycare assistance. Patient will require a SPC and OP therapies. No additional questions or concerns.   Dyanne Iha 07/06/2022, 2:07 PM

## 2022-07-06 NOTE — Discharge Instructions (Addendum)
Inpatient Rehab Discharge Instructions  Jonathan [name] Discharge date and time:  07/08/22  Activities/Precautions/ Functional Status: Activity: no lifting, driving, or strenuous exercise till cleared by MD Diet: cardiac diet Wound Care: none needed   Functional status:  ___ No restrictions     ___ Walk up steps independently ___ 24/7 supervision/assistance   ___ Walk up steps with assistance _X__ Intermittent supervision/assistance  ___ Bathe/dress independently ___ Walk with walker     ___ Bathe/dress with assistance ___ Walk Independently    ___ Shower independently ___ Walk with assistance    _X__ Shower with supervision initially.  _X__ No alcohol/Cocaine    ___ Return to work/school ________   COMMUNITY REFERRALS UPON DISCHARGE:     Outpatient: [place]  [id number]              Appointment Date/Time: Please allow 3-7 days for scheduling to reach out.  Medical Equipment/Items Ordered: single Psychiatrist: [facility] [phone]    Special Instructions: Will need to call PCP for appointment in next 2 weeks.   My questions have been answered and I understand these instructions. I will adhere to these goals and the provided educational materials after my discharge from the hospital.  Patient/Caregiver Signature _______________________________ Date __________  Clinician Signature _______________________________________ Date __________  Please bring this form and your medication list with you to all your follow-up doctor's appointments.

## 2022-07-06 NOTE — Progress Notes (Signed)
Patient ID: Jonathan [name], male   DOB: 06-13-1975, 47 y.o.   MRN: [id number]  Form faxed to [facility] P: [phone] F: [phone]

## 2022-07-06 NOTE — Progress Notes (Signed)
Patient ID: Jonathan [name], male   DOB: August 05, 1975, 47 y.o.   MRN: [id number]  [facility] and Shower chair ordered through [place].

## 2022-07-06 NOTE — Progress Notes (Signed)
Occupational Therapy Session Note  Patient Details  Name: Jonathan [name] MRN: [id number] Date of Birth: 04/17/1976  Today's Date: 07/06/2022 OT Individual Time: [id number] OT Individual Time Calculation (min): 43 min    Short Term Goals: Week 1:  OT Short Term Goal 1 (Week 1): STG = LTG due to ELOS  Skilled Therapeutic Interventions/Progress Updates:    Pt received in gym finishing his PT session. Pt ambulated back to his room with cane and wanted to eat some breakfast. Pt able to open containers and eat without difficulty.  Pt then ambulated in room with no AD.  Toileted with mod I, showered in standing with set up for towels, etc.  He then dressed without A.  Donned pants in sitting over feet then standing over hips.  Pt moved about room safely with no LOB or running into door frames.  Today he can lift L arm fully over head (vs 120 yesterday).  Sh flex 4-/5 and grasp 5/5, but does have difficulty with [facility].  Provided pt with medium theraputty to work on tip and lateral pinch.  Pt now mod I in the room.    Therapy Documentation Precautions:  Precautions Precautions: Fall Precaution Comments: L hemi Restrictions Weight Bearing Restrictions: No   Pain: Pain Assessment Pain Score: 0-No pain ADL: ADL Eating: Independent Where Assessed-Eating: Bed level Grooming: Independent Where Assessed-Grooming: Standing at sink Upper Body Bathing: Setup Where Assessed-Upper Body Bathing: Shower Lower Body Bathing: Setup Where Assessed-Lower Body Bathing: Shower Upper Body Dressing: Independent Where Assessed-Upper Body Dressing: Edge of bed Lower Body Dressing: Modified independent Where Assessed-Lower Body Dressing: Edge of bed Toileting: Modified independent Where Assessed-Toileting: Glass blower/designer: Diplomatic Services operational officer Method: Counselling psychologist: Energy manager: Metallurgist Method:  Optometrist: Civil engineer, contracting without back Social research officer, government: Distant supervision Social research officer, government Method: Heritage manager: Other (comment) (no equipment)    Therapy/Group: Individual Therapy  Analei Whinery 07/06/2022, 11:16 AM

## 2022-07-06 NOTE — Patient Care Conference (Signed)
Inpatient RehabilitationTeam Conference and Plan of Care Update Date: 07/06/2022   Time: 10:04 AM    Patient Name: Jonathan [name]      Medical Record Number: [id number]  Date of Birth: 05/28/1975 Sex: Male         Room/Bed: 4M12C/4M12C-01 Payor Info: Payor: CIG[employer] / Plan: CIG[employer] [place] HMO CONNECT / Product Type: *No Product type* /    Admit Date/Time:  07/04/2022  4:22 PM  Primary Diagnosis:  Right basal ganglia embolic stroke [facility])  Hospital Problems: Principal Problem:   Right basal ganglia embolic stroke [facility])    Expected Discharge Date: Expected Discharge Date: 07/08/22  Team Members Present: Physician leading conference: Dr. Alysia Penna Social Worker Present: Erlene Quan, BSW Nurse Present: Dorien Chihuahua, RN PT Present: Barrie Folk, PT OT Present: Meriel Pica, OT SLP Present: Weston Anna, SLP     Current Status/Progress Goal Weekly Team Focus  Bowel/Bladder   Pt is continent of bowel/bladder   Pt will remain continent of bowel/bladder   Will assess qshift and PRN    Swallow/Nutrition/ Hydration               ADL's                Mobility   supervision assist ambulatory. use of SPC for safety when faitgued. modified independent with bed mobility. supervision assist trasnfers.   Modified independent with SPC or no AD for gait, transfers,  safety. balance, gait in controlled anfd simulated community environment.    Communication                Safety/Cognition/ Behavioral Observations  Supervision   Supervision-Mod I   problem solving, safety awareness    Pain   Pt is currently pain freef   Pt will remain pain free   Will assess qshift and PRN    Skin   Pt's skin is intact   Pt's skin will remain intact  Will assess qshift and PRN      Discharge Planning:  Discharging home with fiance who works at night (12 hour shifts). 47 Y.O will be in the home for supervision at night. Fiance able to assist up to MOD A but  anticipating MOD I goals due to the 12 hour shifts.   Team Discussion: Patient post right basal ganglia stroke with balance issues and able to self correct loss of balance but presents with decreased safety awareness, inattention, mild memory issues and impulsivity.  Patient on target to meet rehab goals: yes, currently mod I for self care, distant supervision for gait.  *See Care Plan and progress notes for long and short-term goals.   Revisions to Treatment Plan:  N/a   Teaching Needs: Safety, secondary risk management, dietary modifications, gait, etc.   Current Barriers to Discharge: Decreased caregiver support and Home enviroment access/layout  Possible Resolutions to Barriers: Family education OP follow up services DME: shower seat and [facility]     Medical Summary Current Status: uncontrolled HTN, left hemiparesis  Barriers to Discharge: Morbid Obesity;Medical stability   Possible Resolutions to [employer] Focus: Blood pressure control,caregiver education   Continued Need for Acute Rehabilitation Level of Care: The patient requires daily medical management by a physician with specialized training in physical medicine and rehabilitation for the following reasons: Direction of a multidisciplinary physical rehabilitation program to maximize functional independence : Yes Medical management of patient stability for increased activity during participation in an intensive rehabilitation regime.: Yes Analysis of laboratory values and/or radiology reports with  any subsequent need for medication adjustment and/or medical intervention. : Yes   I attest that I was present, lead the team conference, and concur with the assessment and plan of the team.   Dorien Chihuahua B 07/06/2022, 2:14 PM

## 2022-07-06 NOTE — Progress Notes (Signed)
Occupational Therapy Session Note  Patient Details  Name: NEIZAN HEINO MRN: [id number] Date of Birth: [birth year]  Today's Date: 07/06/2022 OT Individual Time: 1409-1505 OT Individual Time Calculation (min): 56 min    Short Term Goals: Week 1:  OT Short Term Goal 1 (Week 1): STG = LTG due to ELOS  Skilled Therapeutic Interventions/Progress Updates:  Pt greeted supine in bed, pt agreeable to OT intervention. Pt completed functional ambulation throughout session with no AD and supervision. Pt does tend to present with L sided inattention during gait seeming to have timing issue in L glute almost presenting like a limp.              Pt demonstrated ability to step over tub threshold for home with no LOB MODI, pt did tend to hold onto rails when stepping over. Recommended grab bars for home and did provide education on where pt purchase shower seat from an energy conservation perspective.   Remainder of session focused on LUE strength and coordination:   - pt completed seated LUE therex using 4 lb weighted ball to complete [place] presses and bicep curls, noted in coordination in LUE when completing task - supine chest presses, shoulder flexion to 90* with 15 lb dowel rod to promote bilateral integration with an emphasis on smooth motor movements - functional reaching from sitting/standing to place squigz on mirror as well as remove/place clothespins on string to facilitate improved [place] in LUE for ADL participation.  -pt completed standing [place] task of folding wash cloths into various patterns with an emphasis on motor planning and bilateral integration.     - pt completed small peg board task however d/t impaired sensation in finger tips pt with great difficulty completing task as well as noted ataxia, however pt very determine and did place 2 pegs in board with LUE. Did provide education on providing proximal support during [facility] tasks to decrease ataxia.  - pt able to transition into quadraped  onto EOM to facilitate NMR to LUE with pt able to use RUE to transport weighted ball across midline to facilitate increased [place] into LUE. Pt also completed x10 bird dogs with an emphasis on postural control and WB'ing into LUE/LLE.  - pt already with small foam blocks in room to practice LUE [facility] however cut blocks into small pieces to further challenge pincer grasp for ADLs.   Ended session with pt supine in bed, pt MODI in room.                Therapy Documentation Precautions:  Precautions Precautions: Fall Precaution Comments: L hemi Restrictions Weight Bearing Restrictions: No  Pain:  no pain     Therapy/Group: Individual Therapy  Precious Haws 07/06/2022, 3:52 PM

## 2022-07-07 ENCOUNTER — Other Ambulatory Visit (HOSPITAL_COMMUNITY): Payer: Self-pay

## 2022-07-07 LAB — BASIC METABOLIC PANEL
Anion gap: 8 (ref 5–15)
BUN: 21 mg/dL — ABNORMAL HIGH (ref 6–20)
CO2: 27 mmol/L (ref 22–32)
Calcium: 9.4 mg/dL (ref 8.9–10.3)
Chloride: 101 mmol/L (ref 98–111)
Creatinine, Ser: 1.65 mg/dL — ABNORMAL HIGH (ref 0.61–1.24)
GFR, Estimated: 52 mL/min — ABNORMAL LOW (ref >60)
Glucose, Bld: 245 mg/dL — ABNORMAL HIGH (ref 70–99)
Potassium: 3.8 mmol/L (ref 3.5–5.1)
Sodium: 136 mmol/L (ref 135–145)

## 2022-07-07 MED ORDER — GABAPENTIN 100 MG PO CAPS
100.0000 mg | ORAL_CAPSULE | Freq: Every day | ORAL | 0 refills | Status: DC
  Filled 2022-07-07: qty 30, 30d supply, fill #0

## 2022-07-07 MED ORDER — ACETAMINOPHEN 325 MG PO TABS: 325.0000 mg | ORAL_TABLET | ORAL | Status: AC | PRN

## 2022-07-07 MED ORDER — CARVEDILOL 6.25 MG PO TABS
6.2500 mg | ORAL_TABLET | Freq: Two times a day (BID) | ORAL | Status: DC
  Administered 2022-07-07 – 2022-07-08 (×3): 6.25 mg via ORAL
  Filled 2022-07-07 (×3): qty 1

## 2022-07-07 MED ORDER — IRBESARTAN 75 MG PO TABS
75.0000 mg | ORAL_TABLET | Freq: Every day | ORAL | 0 refills | Status: DC
  Filled 2022-07-07: qty 30, 30d supply, fill #0

## 2022-07-07 MED ORDER — BACLOFEN 5 MG PO TABS
5.0000 mg | ORAL_TABLET | Freq: Three times a day (TID) | ORAL | 0 refills | Status: DC | PRN
  Filled 2022-07-07: qty 30, 10d supply, fill #0

## 2022-07-07 MED ORDER — NICOTINE 7 MG/24HR TD PT24
7.0000 mg | MEDICATED_PATCH | Freq: Every day | TRANSDERMAL | 0 refills | Status: DC
  Filled 2022-07-07 – 2022-08-26 (×3): qty 28, 28d supply, fill #0

## 2022-07-07 MED ORDER — CLOPIDOGREL BISULFATE 75 MG PO TABS
75.0000 mg | ORAL_TABLET | Freq: Every day | ORAL | 0 refills | Status: DC
  Filled 2022-07-07: qty 14, 14d supply, fill #0

## 2022-07-07 MED ORDER — CARVEDILOL 6.25 MG PO TABS
6.2500 mg | ORAL_TABLET | Freq: Two times a day (BID) | ORAL | 0 refills | Status: DC
  Filled 2022-07-07: qty 60, 30d supply, fill #0

## 2022-07-07 MED ORDER — AMLODIPINE BESYLATE 10 MG PO TABS
10.0000 mg | ORAL_TABLET | Freq: Every day | ORAL | 0 refills | Status: DC
  Filled 2022-07-07: qty 30, 30d supply, fill #0

## 2022-07-07 MED ORDER — ASPIRIN 81 MG PO CHEW
81.0000 mg | CHEWABLE_TABLET | Freq: Every day | ORAL | 0 refills | Status: DC
  Filled 2022-07-07: qty 30, 30d supply, fill #0

## 2022-07-07 MED ORDER — ATORVASTATIN CALCIUM 20 MG PO TABS
20.0000 mg | ORAL_TABLET | Freq: Every day | ORAL | 0 refills | Status: DC
  Filled 2022-07-07: qty 30, 30d supply, fill #0

## 2022-07-07 MED ORDER — TRAZODONE HCL 50 MG PO TABS
50.0000 mg | ORAL_TABLET | Freq: Every evening | ORAL | 0 refills | Status: DC | PRN
  Filled 2022-07-07: qty 30, 30d supply, fill #0

## 2022-07-07 NOTE — Plan of Care (Signed)
  Problem: RH Balance Goal: LTG Patient will maintain dynamic standing balance (PT) Description: LTG:  Patient will maintain dynamic standing balance with assistance during mobility activities (PT) Outcome: Completed/Met   Problem: Sit to Stand Goal: LTG:  Patient will perform sit to stand with assistance level (PT) Description: LTG:  Patient will perform sit to stand with assistance level (PT) Outcome: Completed/Met   Problem: RH Bed Mobility Goal: LTG Patient will perform bed mobility with assist (PT) Description: LTG: Patient will perform bed mobility with assistance, with/without cues (PT). Outcome: Completed/Met   Problem: RH Bed to Chair Transfers Goal: LTG Patient will perform bed/chair transfers w/assist (PT) Description: LTG: Patient will perform bed to chair transfers with assistance (PT). Outcome: Completed/Met   Problem: RH Car Transfers Goal: LTG Patient will perform car transfers with assist (PT) Description: LTG: Patient will perform car transfers with assistance (PT). Outcome: Completed/Met   Problem: RH Furniture Transfers Goal: LTG Patient will perform furniture transfers w/assist (OT/PT) Description: LTG: Patient will perform furniture transfers  with assistance (OT/PT). Outcome: Completed/Met   Problem: RH Ambulation Goal: LTG Patient will ambulate in controlled environment (PT) Description: LTG: Patient will ambulate in a controlled environment, # of feet with assistance (PT). Outcome: Completed/Met Goal: LTG Patient will ambulate in home environment (PT) Description: LTG: Patient will ambulate in home environment, # of feet with assistance (PT). Outcome: Completed/Met   Problem: RH Stairs Goal: LTG Patient will ambulate up and down stairs w/assist (PT) Description: LTG: Patient will ambulate up and down # of stairs with assistance (PT) Outcome: Completed/Met   

## 2022-07-07 NOTE — Progress Notes (Signed)
Occupational Therapy Discharge Summary  Patient Details  Name: Jonathan [name] MRN: [id number] Date of Birth: October 31, 1975  Date of Discharge from OT service:July 07, 2022  Patient has met 11 of 11 long term goals due to improved balance, postural control, ability to compensate for deficits, functional use of  LEFT upper and LEFT lower extremity, improved awareness, and improved coordination.  Patient to discharge at overall Modified Independent level.  Patient's care partner is independent to provide the necessary physical and cognitive assistance at discharge.    Reasons goals not met: n/a  Recommendation:  Patient will benefit from ongoing skilled OT services in outpatient setting to continue to advance functional skills in the area of iADL.  Equipment: Shower chair in case pt should get fatigued during standing  Reasons for discharge: treatment goals met  Patient/family agrees with progress made and goals achieved: Yes  OT Discharge Precautions/Restrictions  Precautions Precautions: Fall Precaution Comments: L hemi Restrictions Weight Bearing Restrictions: No  ADL ADL Eating: Independent Where Assessed-Eating: Bed level Grooming: Independent Where Assessed-Grooming: Standing at sink Upper Body Bathing: Modified independent Where Assessed-Upper Body Bathing: Shower Lower Body Bathing: Modified independent Where Assessed-Lower Body Bathing: Shower Upper Body Dressing: Independent Where Assessed-Upper Body Dressing: Edge of bed Lower Body Dressing: Modified independent Where Assessed-Lower Body Dressing: Edge of bed Toileting: Modified independent Where Assessed-Toileting: Glass blower/designer: Diplomatic Services operational officer Method: Counselling psychologist: Energy manager: Modified independent Clinical cytogeneticist Method: Optometrist: Other (comment) (no equipment) Theatre stage manager Method: Heritage manager: Other (comment) (no equipment) Vision Baseline Vision/History: 0 No visual deficits Patient Visual Report: No change from baseline Vision Assessment?: No apparent visual deficits Visual Fields: No apparent deficits Perception  Perception: Within Functional Limits Praxis Praxis: Intact Cognition Cognition Overall Cognitive Status: Impaired/Different from baseline Arousal/Alertness: Awake/alert Orientation Level: Person;Place;Situation Person: Oriented Place: Oriented Situation: Oriented Memory: Impaired Memory Impairment: Decreased recall of new information;Decreased short term memory Brief Interview for Mental Status (BIMS) Repetition of Three Words (First Attempt): 3 Temporal Orientation: Year: Correct Temporal Orientation: Month: Accurate within 5 days Temporal Orientation: Day: Correct Recall: "Sock": Yes, no cue required Recall: "Blue": Yes, no cue required Recall: "Bed": No, could not recall BIMS Summary Score: 13 Sensation Sensation Central sensation comments: numbbess in fingertips and side of hand and toes on L, L hand localization intact Light Touch Impaired Details: Impaired LUE;Impaired LLE Hot/Cold: Appears Intact Stereognosis: Impaired by gross assessment Coordination Gross Motor Movements are Fluid and Coordinated: No Fine Motor Movements are Fluid and Coordinated: No Coordination and Movement Description: mild uncoordinated due to L hemi, weakness, decreased endurance, decreased sensation Finger Nose Finger Test: slow, decreased ROM due to shoulder weakness, and mild dysmetria on LUE Motor  Motor Motor - Skilled Clinical Observations: mild uncoordinated due to L hemi, weakness, decreased endurance, decreased sensation Motor - Discharge Observations: mild uncoordinated due to L hemi, weakness, decreased endurance, decreased sensation Mobility    Mod I with SPC for longer  distances, independent  Trunk/Postural Assessment    Slight left lean with dynamic standing due to weakness in trunk  Balance Static Sitting Balance Static Sitting - Level of Assistance: 7: Independent Dynamic Sitting Balance Dynamic Sitting - Level of Assistance: 7: Independent Static Standing Balance Static Standing - Level of Assistance: 7: Independent Dynamic Standing Balance Dynamic Standing - Level of Assistance: 6: Modified independent (Device/Increase time) Extremity/Trunk Assessment RUE Assessment RUE Assessment: Within Functional Limits LUE Assessment Active  Range of Motion (AROM) Comments: WFL General Strength Comments: 4- shoulder, 4 distally - decreased finger strength due to limited sensation LUE Body System: Neuro Brunstrum levels for arm and hand: Arm;Hand Brunstrum level for arm: Stage V Relative Independence from Synergy Brunstrum level for hand: Stage V Independence from basic synergies   Jonathan [name] 07/07/2022, 10:19 AM

## 2022-07-07 NOTE — Progress Notes (Signed)
Physical Therapy Discharge Summary  Patient Details  Name: Jonathan [name] MRN: [id number] Date of Birth: 1976/01/05  Date of Discharge from PT service:July 08, 2019  Today's Date: 07/07/2022 PT Individual Time: 1106-1202 PT Individual Time Calculation (min): 56 min    Patient has met 9 of 9 long term goals due to improved activity tolerance, improved balance, improved postural control, increased strength, increased range of motion, ability to compensate for deficits, functional use of  left upper extremity and left lower extremity, improved attention, improved awareness, and improved coordination.  Patient to discharge at an ambulatory level Modified Independent with SPC.   Patient's care partner is independent to provide the necessary physical assistance at discharge.  Reasons goals not met: All PT goals met   Recommendation:  Patient will benefit from ongoing skilled PT services in outpatient setting to continue to advance safe functional mobility, address ongoing impairments in balance, coordination, safety, strength, and minimize fall risk.  Equipment: [facility]  Reasons for discharge: treatment goals met and discharge from hospital  Patient/family agrees with progress made and goals achieved: Yes  PT treatment:   Pt received supine in bed and agreeable to PT. Supine>sit transfer without assist or cues from PT. PT instructed pt in Grad day assessment to measure progress toward goals. See below for details. CARETool mobility assessment  also completed; see CAREtool tab in navigator for details.  Patient demonstrates increased fall risk as noted by score of 25/30 on  Functional Gait Assessment.   <22/30 = predictive of falls, <20/30 = fall in 6 months, <18/30 = predictive of falls in PD MCID: 5 points stroke population, 4 points geriatric population (ANPTA Core Set of Outcome Measures for Adults with Neurologic Conditions, 2018)  Car transfer performed without assist from PT.     Pt performed gait training through hospital, atrium, and over cement side walk [facility] >[facility] without assist From PT [facility] 1 seated rest break due to BLE fatigue.   [facility] and all needs met.     PT Discharge Precautions/Restrictions Precautions Precautions: Fall Precaution Comments: L hemi Restrictions Weight Bearing Restrictions: No  Pain Pain Assessment Pain Scale: 0-10 Pain Score: 0-No pain Pain Interference Pain Interference Pain Effect on Sleep: 1. Rarely or not at all Pain Interference with Therapy Activities: 1. Rarely or not at all Pain Interference with Day-to-Day Activities: 1. Rarely or not at all Vision/Perception  Vision - History Ability to See in Adequate Light: 0 Adequate Vision - Assessment Eye Alignment: Within Functional Limits Ocular Range of Motion: Within Functional Limits Perception Perception: Impaired (mild L side of body  inattention. improved from eval) Praxis Praxis: Intact  Cognition Overall Cognitive Status: Impaired/Different from baseline Arousal/Alertness: Awake/alert Memory: Impaired Memory Impairment: Decreased recall of new information;Decreased short term memory Safety/Judgment: Impaired Comments: decreased awareness to functional deficits. improved from eval Sensation Sensation Central sensation comments: numbbess in fingertips and side of hand and toes on L, L hand localization intact. able to detect all stimuli in the LLE, but mild parasthesia Light Touch Impaired Details: Impaired LUE;Impaired LLE Hot/Cold: Appears Intact Stereognosis: Impaired by gross assessment Coordination Gross Motor Movements are Fluid and Coordinated: No Fine Motor Movements are Fluid and Coordinated: No Coordination and Movement Description: mild uncoordinated due to L hemi, weakness, decreased endurance, decreased sensation Finger Nose Finger  Test: slow, decreased ROM due to shoulder weakness, and mild dysmetria on  LUE Heel Shin Test: mild ROm deficits and decresaed speed on the LLE Motor  Motor Motor: Hemiplegia Motor - Skilled Clinical Observations: mild uncoordinated due to L hemi, weakness, decreased endurance, decreased sensation Motor - Discharge Observations: mild uncoordinated due to L hemi, weakness, decreased endurance, decreased sensation  Mobility   Locomotion  Gait Ambulation: Yes Gait Assistance: Independent with assistive device Gait Distance (Feet): 500 Feet Gait Gait: Yes Gait Pattern: Impaired Stairs / Additional Locomotion Stairs: Yes Stairs Assistance: Independent with assistive device Stair Management Technique: No rails Number of Stairs: 12 Height of Stairs: 6 Pick up small object from the floor assist level: Independent with assistive device Wheelchair Mobility Wheelchair Mobility: No  Trunk/Postural Assessment     Balance Balance Balance Assessed: Yes Standardized Balance Assessment Standardized Balance Assessment: Functional Gait Assessment;Timed Up and Go Test Timed Up and Go Test Normal TUG (seconds): 10.4 (9.3) Static Sitting Balance Static Sitting - Level of Assistance: 7: Independent Dynamic Sitting Balance Dynamic Sitting - Level of Assistance: 7: Independent Static Standing Balance Static Standing - Level of Assistance: 7: Independent Dynamic Standing Balance Dynamic Standing - Level of Assistance: 6: Modified independent (Device/Increase time) Functional Gait  Assessment Gait assessed : Yes Gait Level Surface: Walks 20 ft in less than 5.5 sec, no assistive devices, good speed, no evidence for imbalance, normal gait pattern, deviates no more than 6 in outside of the 12 in walkway width. Change in Gait Speed: Able to smoothly change walking speed without loss of balance or gait deviation. Deviate no more than 6 in outside of the 12 in walkway width. Gait with Horizontal Head  Turns: Performs head turns smoothly with no change in gait. Deviates no more than 6 in outside 12 in walkway width Gait with Vertical Head Turns: Performs head turns with no change in gait. Deviates no more than 6 in outside 12 in walkway width. Gait and Pivot Turn: Pivot turns safely within 3 sec and stops quickly with no loss of balance. Step Over Obstacle: Is able to step over one shoe box (4.5 in total height) without changing gait speed. No evidence of imbalance. Gait with Narrow Base of Support: Ambulates less than 4 steps heel to toe or cannot perform without assistance. Gait with Eyes Closed: Walks 20 ft, no assistive devices, good speed, no evidence of imbalance, normal gait pattern, deviates no more than 6 in outside 12 in walkway width. Ambulates 20 ft in less than 7 sec. Ambulating Backwards: Walks 20 ft, uses assistive device, slower speed, mild gait deviations, deviates 6-10 in outside 12 in walkway width. Steps: Alternating feet, no rail. Total Score: 25 Extremity Assessment  RUE Assessment RUE Assessment: Within Functional Limits LUE Assessment Active Range of Motion (AROM) Comments: WFL General Strength Comments: 4- shoulder, 4 distally - decreased finger strength due to limited sensation LUE Body System: Neuro Brunstrum levels for arm and hand: Arm;Hand Brunstrum level for arm: Stage V Relative Independence from Synergy Brunstrum level for hand: Stage V Independence from basic synergies RLE Assessment RLE Assessment: Within Functional Limits RLE Strength Right Hip Flexion: 4+/5 Right Hip ABduction: 5/5 Right Hip ADduction: 5/5 Right Knee Flexion: 5/5 Right Knee Extension: 5/5 Right Ankle Dorsiflexion: 4+/5 Right Ankle Plantar Flexion: 4+/5 LLE Assessment LLE Assessment: Exceptions to [facility] General Strength Comments: tested sitting EOB LLE Strength Left Hip Flexion: 4/5 Left Hip ABduction: 4/5 Left Hip ADduction: 4/5 Left Knee Flexion: 4/5 Left Knee Extension:  4/5 Left Ankle Dorsiflexion: 4/5 Left Ankle Plantar Flexion: 4/5  Lorie Phenix 07/07/2022, 1:22 PM

## 2022-07-07 NOTE — IPOC Note (Signed)
Overall Plan of Care [facility]) Patient Details Name: Jonathan [name] MRN: [id number] DOB: 06-01-1975  Admitting Diagnosis: Right basal ganglia embolic stroke [facility])  Hospital Problems: Principal Problem:   Right basal ganglia embolic stroke [facility])     Functional Problem List: Nursing Endurance, Pain, Medication Management, Safety  PT Balance, Endurance, Motor, Perception, Safety, Sensory  OT Balance, Cognition, Endurance, Motor, Pain, Safety, Sensory  SLP Cognition  TR         Basic ADL's: OT Bathing, Dressing, Toileting     Advanced  ADL's: OT Simple Meal Preparation, Laundry     Transfers: PT Bed Mobility, Bed to Chair, Car, Manufacturing systems engineer, Metallurgist: PT Ambulation, Stairs     Additional Impairments: OT Fuctional Use of Upper Extremity  SLP        TR      Anticipated Outcomes Item Anticipated Outcome  Self Feeding Mod I  Swallowing      Basic self-care  Mod I  Toileting  Mod I   Bathroom Transfers Mod I  Bowel/Bladder  n/a  Transfers  Mod I with LRAD  Locomotion  Mod I with LRAD  Communication     Cognition  Mod I  Pain  < 4 with prns  Safety/Judgment  manage w cues   Therapy Plan: PT Intensity: Minimum of 1-2 x/day ,45 to 90 minutes PT Frequency: 5 out of 7 days PT Duration Estimated Length of Stay: 3-5 days OT Intensity: Minimum of 1-2 x/day, 45 to 90 minutes OT Frequency: 5 out of 7 days OT Duration/Estimated Length of Stay: 3-5 days SLP Intensity: Minumum of 1-2 x/day, 30 to 90 minutes SLP Frequency: 3 to 5 out of 7 days SLP Duration/Estimated Length of Stay: 3-5 days   Team Interventions: Nursing Interventions Disease Management/Prevention, Medication Management, Discharge Planning, Pain Management  PT interventions Ambulation/gait training, Training and development officer, Community reintegration, Discharge planning, DME/adaptive equipment instruction, Neuromuscular re-education, Functional mobility training,  Patient/family education, Therapeutic Exercise, Therapeutic Activities, Stair training, UE/LE Strength taining/ROM, Disease management/prevention  OT Interventions Balance/vestibular training, Discharge planning, Functional electrical stimulation, Pain management, Self Care/advanced ADL retraining, Therapeutic Activities, UE/LE Coordination activities, Disease mangement/prevention, Functional mobility training, Patient/family education, Therapeutic Exercise, DME/adaptive equipment instruction, Neuromuscular re-education, UE/LE Strength taining/ROM  SLP Interventions Cognitive remediation/compensation, Cueing hierarchy, Environmental controls, Functional tasks, Internal/external aids, Patient/family education  TR Interventions    SW/CM Interventions Discharge Planning, Psychosocial Support, Patient/Family Education, Disease Management/Prevention   Barriers to Discharge MD  Medical stability  Nursing Decreased caregiver support, Home environment access/layout 1 level 4 ste no rails w fiance; S.O. 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 shift; 47 yr old home at night  PT Decreased caregiver support, Inaccessible home environment, Home environment access/layout, Lack of/limited family support, Other (comments) decreased insight into defictis, 4 STE with no rails, 3 steps to get in/out of bed with no rail, fiance works during the day  Air cabin crew, Lack of/limited family support    SLP      SW Insurance for SNF coverage, Decreased caregiver support, Lack of/limited family support     Team Discharge Planning: Destination: PT-Home ,OT- Home , SLP-Home Projected Follow-up: PT-Outpatient PT, OT-  Outpatient OT, SLP- (Intermittent Supervision) Projected Equipment Needs: PT-To be determined, OT- Other (comment), SLP-None recommended by SLP Equipment Details: PT-pt reports he would be willing to obtain own cane if needed at D/C, OT-needs a shower chair Patient/family involved in discharge planning: PT- Patient,   OT-Patient, SLP-Patient  MD ELOS: [facility] Prognosis:  Excellent Assessment: The patient has been admitted for CIR therapies with the diagnosis of RIght basal ganglia CVA. The team will be addressing functional mobility, strength, stamina, balance, safety, adaptive techniques and equipment, self-care, bowel and bladder mgt, patient and caregiver education, hypertension. Goals have been set at Mod I. Anticipated discharge destination is Home .        See Team Conference Notes for weekly updates to the plan of care

## 2022-07-07 NOTE — Plan of Care (Signed)
°  Problem: RH Problem Solving °Goal: LTG Patient will demonstrate problem solving for (SLP) °Description: LTG:  Patient will demonstrate problem solving for basic/complex daily situations with cues  (SLP) °Outcome: Completed/Met °  °Problem: RH Memory °Goal: LTG Patient will use memory compensatory aids to (SLP) °Description: LTG:  Patient will use memory compensatory aids to recall biographical/new, daily complex information with cues (SLP) °Outcome: Completed/Met °  °

## 2022-07-07 NOTE — Plan of Care (Signed)
  Problem: RH Balance Goal: LTG Patient will maintain dynamic standing with ADLs (OT) Description: LTG:  Patient will maintain dynamic standing balance with assist during activities of daily living (OT)  Outcome: Completed/Met   Problem: Sit to Stand Goal: LTG:  Patient will perform sit to stand in prep for activites of daily living with assistance level (OT) Description: LTG:  Patient will perform sit to stand in prep for activites of daily living with assistance level (OT) Outcome: Completed/Met   Problem: RH Bathing Goal: LTG Patient will bathe all body parts with assist levels (OT) Description: LTG: Patient will bathe all body parts with assist levels (OT) Outcome: Completed/Met   Problem: RH Dressing Goal: LTG Patient will perform upper body dressing (OT) Description: LTG Patient will perform upper body dressing with assist, with/without cues (OT). Outcome: Completed/Met Goal: LTG Patient will perform lower body dressing w/assist (OT) Description: LTG: Patient will perform lower body dressing with assist, with/without cues in positioning using equipment (OT) Outcome: Completed/Met   Problem: RH Toileting Goal: LTG Patient will perform toileting task (3/3 steps) with assistance level (OT) Description: LTG: Patient will perform toileting task (3/3 steps) with assistance level (OT)  Outcome: Completed/Met   Problem: RH Functional Use of Upper Extremity Goal: LTG Patient will use RT/LT upper extremity as a (OT) Description: LTG: Patient will use right/left upper extremity as a stabilizer/gross assist/diminished/nondominant/dominant level with assist, with/without cues during functional activity (OT) Outcome: Completed/Met   Problem: RH Simple Meal Prep Goal: LTG Patient will perform simple meal prep w/assist (OT) Description: LTG: Patient will perform simple meal prep with assistance, with/without cues (OT). Outcome: Completed/Met   Problem: RH Laundry Goal: LTG Patient will  perform laundry w/assist, cues (OT) Description: LTG: Patient will perform laundry with assistance, with/without cues (OT). Outcome: Completed/Met   Problem: RH Toilet Transfers Goal: LTG Patient will perform toilet transfers w/assist (OT) Description: LTG: Patient will perform toilet transfers with assist, with/without cues using equipment (OT) Outcome: Completed/Met   Problem: RH Tub/Shower Transfers Goal: LTG Patient will perform tub/shower transfers w/assist (OT) Description: LTG: Patient will perform tub/shower transfers with assist, with/without cues using equipment (OT) Outcome: Completed/Met

## 2022-07-07 NOTE — Progress Notes (Signed)
Speech Language Pathology Discharge Summary  Patient Details  Name: Jonathan [name] MRN: [id number] Date of Birth: [birth year]  Date of Discharge from SLP service:July 07, 2022  Today's Date: 07/07/2022 SLP Individual Time: [id number] SLP Individual Time Calculation (min): 60 min  Skilled Therapeutic Interventions:  Skilled ST treatment focused on cognitive goals and education in preparation of upcoming discharge. SLP facilitated session by providing stroke education using BE FAST acronym, as well as considerations for living a brain healthy lifestyle using DANCERS acronym. Discussed each in detail and provided pt with handouts for reference. When discussing stroke prevention, pt reported he has dealt with high blood pressure for years and didn't consistently take his blood pressure medication because he didn't feel it was necessary or helping. Pt reported he didn't realize the significance of having a systolic blood pressure in the 200's, which he has experienced reached on several occasions per pt report. Symptoms typically include "seeing black dots". SLP referenced a blood pressure chart per the American Heart Association ranging from normal to hypertensive emergency. SLP encouraged pt to seek medical attention immediately with high blood pressure results and accompanied by symptoms such as seeing black dots. Reviewed other symptoms (light headedness, sweatiness, nausea, vomiting). Prior to education, it sounded as though pt may not be interested in taking blood pressure meds at discharge, however following further discussion pt reported he felt it was very important he follow medication recommendations very closely. Similarly, pt was initially uninterested in considering using a pill organizer, however following education, pt stated he was very interested and felt it would be a good idea to help with his memory. SLP simulated organizing medications via BID and TID pillboxes. Pt selected  appropriate slots for organizing various medication labels (once daily, twice daily, 3x daily, take 2 pills once daily, etc) with sup A verbal cues fading to mod I. SLP recommended for pt have someone present when organizing pillbox at discharge to ensure accuracy and confidence. 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 Pt verbalized understanding to all education through teach back and demonstration. Patient was left in bed with alarm activated and immediate needs within reach at end of session. Continue per current plan of care.     Patient has met 2 of 2 long term goals.  Patient to discharge at overall Modified Independent level.   Reasons goals not met: N/A   Clinical Impression/Discharge Summary: Patient has made functional gains and has met 2 of 2 LTGs this admission. Currently, patient requires overall Mod I to complete functional and mildly complex tasks safely in regards to recall and functional problem solving. Patient education is complete and patient will discharge home with intermittent assistance from family. Suspect patient is at his baseline level of cognitive functioning, therefore, SLP f/u is not warranted at this time. Patient verbalized understanding and agreement.   Care Partner:  Caregiver Able to Provide Assistance: Yes     Recommendation: None  Equipment: N/A  Reasons for discharge: Discharged from hospital;Treatment goals met   Patient/Family Agrees with Progress Made and Goals Achieved: Yes    PAYNE, [place] 07/07/2022, 6:59 AM

## 2022-07-07 NOTE — Progress Notes (Signed)
Inpatient Rehabilitation Discharge Medication Review by a Pharmacist  A complete drug regimen review was completed for this patient to identify any potential clinically significant medication issues.  High Risk Drug Classes Is patient taking? Indication by Medication  Antipsychotic No   Anticoagulant No   Antibiotic No   Opioid No   Antiplatelet Yes Clopidogrel x 2 additional weeks, aspirin - CVA  Hypoglycemics/insulin No   Vasoactive Medication Yes Amlodipine, carvedilol, irbesartan- BP  Chemotherapy No   Other Yes Trazodone - sleep Atorvastatin - HLD Baclofen - prn spasms Gabapentin - pain     Type of Medication Issue Identified Description of Issue Recommendation(s)  Drug Interaction(s) (clinically significant)     Duplicate Therapy     Allergy     No Medication Administration End Date     Incorrect Dose     Additional Drug Therapy Needed     Significant med changes from prior encounter (inform family/care partners about these prior to discharge).    Other       Clinically significant medication issues were identified that warrant physician communication and completion of prescribed/recommended actions by midnight of the next day:  No  Pharmacist comments: None  Time spent performing this drug regimen review (minutes):  30 minutes  Thank you Anette Guarneri, PharmD

## 2022-07-07 NOTE — Progress Notes (Signed)
Occupational Therapy Session Note  Patient Details  Name: KIYA ELEK MRN: [id number] Date of Birth: 1975-06-01  Today's Date: 07/07/2022 OT Individual Time: [id number] OT Individual Time Calculation (min): 75 min    Short Term Goals: Week 1:  OT Short Term Goal 1 (Week 1): STG = LTG due to ELOS  Skilled Therapeutic Interventions/Progress Updates:    Pt received in bed ready for therapy. He stated he had taken a 2nd shower last night without A and was ready for therapy.  He had a calf cramp last night so showed him how to massage calf using tennis ball under leg on bed, using handle of cane for dorsiflexion stretch and then he practiced standing at wall for calf stretch.  Pt ambulated with cane to ortho gym to focus on HEP for L arm. Pt started out with overhead stretches using cane, then shoulder control and stabilization exercises with arm circles, then blue level 4 band exercises with diagonal arm lifts in each direction.  Also added in standing pulls with band anchored, for rows and chest expansions. He now has full AROM of shoulder but needs to work on moving it symmetrically with R arm and resting when arm gets fatigued.  Provided with band and HEP.   Standing and cybex arm bike on resistance 10 for 4 min spinning bike fast.  He fatigued, rested and then did 2nd set for 4 minutes slower at resistance 8. Pt did well maintaining standing balance as his arms moved bike pedals.    Pt ambulated to kitchen and practiced moving items out of high and low cabinets, open oven, placing items in oven.  Discussed limited sensation in L hand and for now it is advised he always use an oven mit on L hand if cooking on stove, to not do any chopping of ingredients, to be cautious when working with hot items.  Advised pt to focus on using L arm in functional tasks of cleaning kitchen, washing dishes, folding laundry.  Pt ambulated back to room. His fiance arrived and reiterated these recommendations to  her.    Pt in room with all needs met.   Therapy Documentation Precautions:  Precautions Precautions: Fall Precaution Comments: L hemi Restrictions Weight Bearing Restrictions: No      Pain: Pain Assessment Pain Scale: 0-10 Pain Score: 0-No pain ADL: ADL Eating: Independent Where Assessed-Eating: Bed level Grooming: Independent Where Assessed-Grooming: Standing at sink Upper Body Bathing: Modified independent Where Assessed-Upper Body Bathing: Shower Lower Body Bathing: Modified independent Where Assessed-Lower Body Bathing: Shower Upper Body Dressing: Independent Where Assessed-Upper Body Dressing: Edge of bed Lower Body Dressing: Modified independent Where Assessed-Lower Body Dressing: Edge of bed Toileting: Modified independent Where Assessed-Toileting: Glass blower/designer: Diplomatic Services operational officer Method: Counselling psychologist: Energy manager: Modified independent Clinical cytogeneticist Method: Optometrist: Other (comment) (no equipment) Transport planner Method: Heritage manager: Other (comment) (no equipment)   Therapy/Group: Individual Therapy  Roxie 07/07/2022, 10:42 AM

## 2022-07-07 NOTE — Progress Notes (Addendum)
Patient ID: Jonathan [name], male   DOB: 08-06-1975, 47 y.o.   MRN: [id number]  [facility] and shower chair delivered through [employer].  OP referral faxed to [facility] [place] F: [phone]

## 2022-07-07 NOTE — Discharge Summary (Signed)
Physician Discharge Summary  Patient ID: Jonathan [name] MRN: [id number] DOB/AGE: 47-Dec-1977 47 y.o.  Admit date: 07/04/2022 Discharge date: 07/08/2022  Discharge Diagnoses:  Principal Problem:   Right basal ganglia embolic stroke [facility]) Active Problems:   HTN (hypertension)   Acute renal failure superimposed on chronic kidney disease (HCC)   Elevated lipids   Discharged Condition: good  Significant Diagnostic Studies: N/A   Labs:  Basic Metabolic Panel: Recent Labs  Lab 07/04/22 1818 07/05/22 0514 07/07/22 0720  NA  --  137 136  K  --  3.3* 3.8  CL  --  101 101  CO2  --  27 27  GLUCOSE  --  197* 245*  BUN  --  19 21*  CREATININE 1.58* 1.60* 1.65*  CALCIUM  --  9.2 9.4    CBC: Recent Labs  Lab 07/05/22 0514  WBC 6.2  NEUTROABS 3.7  HGB 13.9  HCT 39.8  MCV 88.8  PLT 232    CBG: No results for input(s): "GLUCAP" in the last 168 hours.   Brief HPI:   Jonathan [name] is a 47 y.o. male with of untreated hypertension and tobacco use who was admitted on 06/30/2022 with left arm and left leg weakness with that started the night before.  CTA head/neck was negative for LVO.  UDS was positive for cocaine.  MRI brain showed small acute infarct in white matter tracts in right basal ganglia and overlying frontal lobe.  Dr. 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  He recommended DAPT x 3 weeks followed by ASA alone as well as participation in sleep Study given risk of sleep apnea.  Patient was independent prior to admission and therapy evaluations revealed deficits due to LLE weakness with knee instability, LUE weakness with sensory deficits as well as decrease in safety awareness.  CIR was recommended due to functional decline.   Hospital Course: [name] was admitted to rehab 07/04/2022 for inpatient therapies to consist of PT, ST and OT at least three hours five days a week. Past admission physiatrist, therapy team and  rehab RN have worked together to provide customized collaborative inpatient rehab.  He was maintained on DAPT throughout his stay.  Follow-up CBC showed H&H and platelets to be stable.   His blood pressures were monitored on TID basis and he had upward trend in serum creatinine with AKI therefore HCTZ was discontinued. Mild hypokalemia has resolved with brief supplementation.  Coreg 6.25 mg twice daily was added prior to discharge and anticipate he will need further titration of medications on outpatient basis.  Nicotine patch has been used during his stay and he has been educated on importance of nicotine as well as cocaine cessation and medication compliance.  He has made good gains during his stay and was modified independent at discharge.  He will continue to receive follow-up outpatient PT OT at [facility] neuro rehab after discharge.   Rehab course: During patient's stay in rehab weekly team conferences were held to monitor patient's progress, set goals and discuss barriers to discharge. At admission, patient required contact-guard to min assist with ADL task and contact-guard assist for mobility.  Speech therapy evaluation revealed mild deficits in memory.  He has had improvement in activity tolerance, balance, postural control as well as ability to compensate for deficits. He has had improvement in functional use LUE  and  LLE as well as improvement in awareness.  He is able to complete ADL  tasks at modified independent level.  He is modified independent for transfers and is able to ambulate 500 feet with use of SPC.  He is able to complete functional and mildly complex tasks at modified independent level.   Discharge disposition: 01-Home or Self Care  Diet: Heart healthy.  Special Instructions: No strenuous activity. 2.  Plavix will stop after 2 weeks but will be on ASA indefinitely for stroke prevention.  3.  Recommend repeat BMET in  1 to 2 weeks to monitor renal status.   Discharge Instructions      Ambulatory referral to Neurology   Complete by: As directed    An appointment is requested in approximately: 4 weeks   Ambulatory referral to Physical Medicine Rehab   Complete by: As directed    Hospital follow up      Allergies as of 07/08/2022   No Known Allergies      Medication List     STOP taking these medications    ibuprofen 800 MG tablet Commonly known as: ADVIL   tiZANidine 4 MG tablet Commonly known as: Zanaflex       TAKE these medications    acetaminophen 325 MG tablet Commonly known as: TYLENOL Take 1-2 tablets (325-650 mg total) by mouth every 4 (four) hours as needed for mild pain.   amLODipine 10 MG tablet Commonly known as: NORVASC Take 1 tablet (10 mg total) by mouth daily. What changed:  medication strength how much to take   Aspirin Low Dose 81 MG chewable tablet Generic drug: aspirin Chew 1 tablet (81 mg total) by mouth daily.   atorvastatin 20 MG tablet Commonly known as: LIPITOR Take 1 tablet (20 mg total) by mouth daily.   Baclofen 5 MG Tabs Take 1 tablet (5 mg total) by mouth 3 (three) times daily as needed. Notes to patient: For spasms or leg cramps   carvedilol 6.25 MG tablet Commonly known as: COREG Take 1 tablet (6.25 mg total) by mouth 2 (two) times daily with a meal.   clopidogrel 75 MG tablet Commonly known as: PLAVIX Take 1 tablet (75 mg total) by mouth daily. Notes to patient: For next 14  days. Then you will continue aspirin for the rest of your life as your only blood thinner.    gabapentin 100 MG capsule Commonly known as: NEURONTIN Take 1 capsule (100 mg total) by mouth at bedtime.   irbesartan 75 MG tablet Commonly known as: AVAPRO Take 1 tablet (75 mg total) by mouth daily.   Multivitamin Men Tabs Take 1 tablet by mouth daily with breakfast.   nicotine 7 mg/24hr patch Commonly known as: NICODERM CQ - dosed in mg/24 hr Place 1 patch (7 mg total) onto the skin daily.   traZODone 50 MG  tablet Commonly known as: DESYREL Take 1 tablet (50 mg total) by mouth at bedtime as needed for sleep. What changed:  how much to take when to take this reasons to take this        Follow-up Information     [facility] Follow up.   Why: office will call you with follow up appointment Contact information: [address]     [address] [place] [address] [phone]        Erick Colace, MD Follow up.   Specialty: Physical Medicine and Rehabilitation Why: office will call you with follow up appointment Contact information: [address] [place] [address] [phone]  Dulce Sellar, NP Follow up on 07/25/2022.   Specialty: Family Medicine Why: be there at 1:45 pm for 2 pm appointment Contact information: [address] [place] [address] [phone]                 Signed: Jacquelynn Cree 07/08/2022, 9:41 AM

## 2022-07-07 NOTE — Progress Notes (Signed)
Tylenol administered PRN. Endorsing leg cramps per patient "charlie horses" intermittently throughout the night. Explains flexion of legs makes the spasms/cramps worse. Explains that this is the first time experiencing this.

## 2022-07-07 NOTE — Progress Notes (Signed)
PROGRESS NOTE   Subjective/Complaints:  No issues overnite, pt feeling ok   ROS- neg SOB, CP Objective:   No results found. Recent Labs    07/05/22 0514  WBC 6.2  HGB 13.9  HCT 39.8  PLT 232    Recent Labs    07/04/22 1818 07/05/22 0514  NA  --  137  K  --  3.3*  CL  --  101  CO2  --  27  GLUCOSE  --  197*  BUN  --  19  CREATININE 1.58* 1.60*  CALCIUM  --  9.2     Intake/Output Summary (Last 24 hours) at 07/07/2022 0748 Last data filed at 07/06/2022 1700 Gross per 24 hour  Intake 240 ml  Output --  Net 240 ml         Physical Exam: Vital Signs Blood pressure (!) 152/76, pulse 94, temperature 98.3 F (36.8 C), temperature source Oral, resp. rate 14, height 5' 11"$  (1.803 m), weight 122 kg, SpO2 100 %.   General: No acute distress Mood and affect are appropriate Heart: Regular rate and rhythm no rubs murmurs or extra sounds Lungs: Clear to auscultation, breathing unlabored, no rales or wheezes Abdomen: Positive bowel sounds, soft nontender to palpation, nondistended Extremities: No clubbing, cyanosis, or edema Skin: No evidence of breakdown, no evidence of rash Neurologic: Cranial nerves II through XII intact, motor strength is 5/5 in bilateral deltoid, bicep, tricep, grip, hip flexor, knee extensors, ankle dorsiflexor and plantar flexor Sensory exam parasthesia Left hemifacial and left hand mainly 4th and 5th digits  Cerebellar exam dysmetria vs weakness LUE FNF Musculoskeletal: Full range of motion in all 4 extremities. No joint swelling   Assessment/Plan: 1. Functional deficits which require 3+ hours per day of interdisciplinary therapy in a comprehensive inpatient rehab setting. Physiatrist is providing close team supervision and 24 hour management of active medical problems listed below. Physiatrist and rehab team continue to assess barriers to discharge/monitor patient progress toward  functional and medical goals  Care Tool:  Bathing    Body parts bathed by patient: Right arm, Left arm, Chest, Abdomen, Front perineal area, Right upper leg, Buttocks, Left upper leg, Right lower leg, Left lower leg, Face         Bathing assist Assist Level: Contact Guard/Touching assist     Upper Body Dressing/Undressing Upper body dressing   What is the patient wearing?: Pull over shirt    Upper body assist Assist Level: Contact Guard/Touching assist (standing)    Lower Body Dressing/Undressing Lower body dressing      What is the patient wearing?: Pants     Lower body assist Assist for lower body dressing: Minimal Assistance - Patient > 75% (standing)     Toileting Toileting    Toileting assist Assist for toileting: Supervision/Verbal cueing (standing)     Transfers Chair/bed transfer  Transfers assist     Chair/bed transfer assist level: Contact Guard/Touching assist     Locomotion Ambulation   Ambulation assist      Assist level: Contact Guard/Touching assist Assistive device: No Device Max distance: 167f   Walk 10 feet activity   Assist     Assist  level: Contact Guard/Touching assist Assistive device: No Device   Walk 50 feet activity   Assist    Assist level: Contact Guard/Touching assist Assistive device: No Device    Walk 150 feet activity   Assist    Assist level: Contact Guard/Touching assist Assistive device: No Device    Walk 10 feet on uneven surface  activity   Assist     Assist level: Contact Guard/Touching assist Assistive device: Other (comment) (no device)   Wheelchair     Assist Is the patient using a wheelchair?: No   Wheelchair activity did not occur: N/A         Wheelchair 50 feet with 2 turns activity    Assist    Wheelchair 50 feet with 2 turns activity did not occur: N/A       Wheelchair 150 feet activity     Assist  Wheelchair 150 feet activity did not occur:  N/A       Blood pressure (!) 152/76, pulse 94, temperature 98.3 F (36.8 C), temperature source Oral, resp. rate 14, height 5' 11"$  (1.803 m), weight 122 kg, SpO2 100 %.  Medical Problem List and Plan: 1. Functional deficits secondary to RIght subcortical infarct             -patient may  shower             -ELOS/Goals: 5-7 d Mod I 2.  Antithrombotics: -DVT/anticoagulation:  Pharmaceutical: Lovenox             -antiplatelet therapy: DAPT X 3 weeks followed by Plavix alone starting 03/08 3. Pain Management: Tylenol prn. Baclofen TID prn for issues with back pain.  4. Mood/Behavior/Sleep:               -antipsychotic agents: N/A             --Restoril prn for sleep.  5. Neuropsych/cognition: This patient is capable of making decisions on his own behalf. 6. Skin/Wound Care: Routine pressure relief measures.  7. Fluids/Electrolytes/Nutrition: Monitor I/O. Check CMET in am.  8. HTN: Does not have PCP/used meds prn. Will monitor BP TID --Continue  Avapro and HCTZ (were added 02/19). Amlodipine resumed on 02/18.             --Avoid hypoperfusion. Titrate as indicated.      Vitals:    07/04/22 1616 07/05/22 0421  BP: (!) 173/115 (!) 149/92  Pulse: 98 88  Resp: 18 18  Temp: 97.7 F (36.5 C) 97.8 F (36.6 C)  SpO2: 99% 96%  May need prn med for diastolic [id number]    9. Elevated Lipids: Trig-161. HDL-35-->Now on Lipitor. 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 Restoril prn effective 11. Tobacco use: Continue nicotine patch.  13.  Impaired fasting blood sugar: Hgb A1C- 5.9.   14.CKD?: SCr 1.37 at admission Recheck CMET in am.                    Latest Ref Rng & Units 07/05/2022    5:14 AM 07/04/2022    6:18 PM 06/30/2022    4:24 PM  BMP  Glucose 70 - 99 mg/dL 197       BUN 6 - 20 mg/dL 19       Creatinine 0.61 - 1.24 mg/dL 1.60  1.58  1.29   Sodium 135 - 145 mmol/L 137       Potassium 3.5 - 5.1 mmol/L 3.3       Chloride  98 - 111 mmol/L 101       CO2 22  - 32 mmol/L 27       Calcium 8.9 - 10.3 mg/dL 9.2       D/c HCTZ will start coreg 6.36m BID- will need close PCP f/u     LOS: 1 days A FACE TO FACE EVALUATION WAS PERFORMED   ACharlett Blake2/20/2024, 8:01 AM     LOS: 3 days A FACE TO FTall TimberE Adelyn Roscher 07/07/2022, 7:48 AM

## 2022-07-08 ENCOUNTER — Other Ambulatory Visit (HOSPITAL_COMMUNITY): Payer: Self-pay

## 2022-07-08 DIAGNOSIS — E785 Hyperlipidemia, unspecified: Secondary | ICD-10-CM | POA: Insufficient documentation

## 2022-07-08 DIAGNOSIS — N179 Acute kidney failure, unspecified: Secondary | ICD-10-CM | POA: Insufficient documentation

## 2022-07-08 DIAGNOSIS — I10 Essential (primary) hypertension: Secondary | ICD-10-CM | POA: Insufficient documentation

## 2022-07-08 NOTE — Progress Notes (Signed)
PROGRESS NOTE   Subjective/Complaints:  No issues overnite , discussed smoking and cocaine cessation for stroke prevention, return to driving in 2 wks Also BP management and need for PCP f/u  ROS- neg SOB, CP Objective:   No results found. No results for input(s): "WBC", "HGB", "HCT", "PLT" in the last 72 hours.  Recent Labs    07/07/22 0720  NA 136  K 3.8  CL 101  CO2 27  GLUCOSE 245*  BUN 21*  CREATININE 1.65*  CALCIUM 9.4     Intake/Output Summary (Last 24 hours) at 07/08/2022 0750 Last data filed at 07/07/2022 1301 Gross per 24 hour  Intake 354 ml  Output --  Net 354 ml         Physical Exam: Vital Signs Blood pressure 136/86, pulse 96, temperature 98.4 F (36.9 C), temperature source Oral, resp. rate 16, height '5\' 11"'$  (1.803 m), weight 122 kg, SpO2 98 %.   General: No acute distress Mood and affect are appropriate Heart: Regular rate and rhythm no rubs murmurs or extra sounds Lungs: Clear to auscultation, breathing unlabored, no rales or wheezes Abdomen: Positive bowel sounds, soft nontender to palpation, nondistended Extremities: No clubbing, cyanosis, or edema Skin: No evidence of breakdown, no evidence of rash Neurologic: Cranial nerves II through XII intact, motor strength is 5/5 in bilateral deltoid, bicep, tricep, grip, hip flexor, knee extensors, ankle dorsiflexor and plantar flexor Sensory exam parasthesia Left hemifacial and left hand mainly 4th and 5th digits  Cerebellar exam dysmetria vs weakness LUE FNF Musculoskeletal: Full range of motion in all 4 extremities. No joint swelling   Assessment/Plan: 1. Functional deficits due to RIght subcortical infarct  Stable for D/C today F/u PCP in 3-4 weeks F/u PM&R 2 weeks See D/C summary See D/C instructions   Care Tool:  Bathing    Body parts bathed by patient: Right arm, Left arm, Chest, Abdomen, Front perineal area, Right upper leg,  Buttocks, Left upper leg, Right lower leg, Left lower leg, Face         Bathing assist Assist Level: Independent with assistive device     Upper Body Dressing/Undressing Upper body dressing   What is the patient wearing?: Pull over shirt    Upper body assist Assist Level: Independent    Lower Body Dressing/Undressing Lower body dressing      What is the patient wearing?: Underwear/pull up, Pants     Lower body assist Assist for lower body dressing: Independent with assitive device     Toileting Toileting    Toileting assist Assist for toileting: Independent     Transfers Chair/bed transfer  Transfers assist     Chair/bed transfer assist level: Independent     Locomotion Ambulation   Ambulation assist      Assist level: Independent with assistive device Assistive device: Cane-straight Max distance: 500   Walk 10 feet activity   Assist     Assist level: Independent with assistive device Assistive device: Cane-straight   Walk 50 feet activity   Assist    Assist level: Independent with assistive device Assistive device: Cane-straight    Walk 150 feet activity   Assist    Assist  level: Independent with assistive device Assistive device: Hand held assist    Walk 10 feet on uneven surface  activity   Assist     Assist level: Independent with assistive device Assistive device: Cane-straight   Wheelchair     Assist Is the patient using a wheelchair?: No   Wheelchair activity did not occur: N/A         Wheelchair 50 feet with 2 turns activity    Assist    Wheelchair 50 feet with 2 turns activity did not occur: N/A       Wheelchair 150 feet activity     Assist  Wheelchair 150 feet activity did not occur: N/A       Blood pressure 136/86, pulse 96, temperature 98.4 F (36.9 C), temperature source Oral, resp. rate 16, height '5\' 11"'$  (1.803 m), weight 122 kg, SpO2 98 %.  Medical Problem List and Plan: 1.  Functional deficits secondary to RIght subcortical infarct             -patient may  shower             -ELOS/Goals: d/c home today  2.  Antithrombotics: -DVT/anticoagulation:  Pharmaceutical: Lovenox             -antiplatelet therapy: DAPT X 3 weeks followed by Plavix alone starting 03/08 3. Pain Management: Tylenol prn. Baclofen TID prn for issues with back pain.  4. Mood/Behavior/Sleep:               -antipsychotic agents: N/A             --Restoril prn for sleep.  5. Neuropsych/cognition: This patient is capable of making decisions on his own behalf. 6. Skin/Wound Care: Routine pressure relief measures.  7. Fluids/Electrolytes/Nutrition: Monitor I/O. Check CMET in am.  8. HTN: Does not have PCP/used meds prn. Will monitor BP TID --Continue  Avapro and HCTZ (were added 02/19). Amlodipine resumed on 02/18.             --Avoid hypoperfusion. Titrate as indicated.      Vitals:    07/04/22 1616 07/05/22 0421  BP: (!) 173/115 (!) 149/92  Pulse: 98 88  Resp: 18 18  Temp: 97.7 F (36.5 C) 97.8 F (36.6 C)  SpO2: 99% 96%  Expect further improvements    9. Elevated Lipids: Trig-161. HDL-35-->Now on Lipitor. 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 Restoril prn effective 11. Tobacco use: Continue nicotine patch.  13.  Impaired fasting blood sugar: Hgb A1C- 5.9.   14.CKD?: SCr 1.37 at admission Recheck CMET in am.                    Latest Ref Rng & Units 07/05/2022    5:14 AM 07/04/2022    6:18 PM 06/30/2022    4:24 PM  BMP  Glucose 70 - 99 mg/dL 197       BUN 6 - 20 mg/dL 19       Creatinine 0.61 - 1.24 mg/dL 1.60  1.58  1.29   Sodium 135 - 145 mmol/L 137       Potassium 3.5 - 5.1 mmol/L 3.3       Chloride 98 - 111 mmol/L 101       CO2 22 - 32 mmol/L 27       Calcium 8.9 - 10.3 mg/dL 9.2       D/c HCTZ will start coreg 6.'25mg'$  BID- will need close PCP f/u- does  not have PCP     LOS: 1 days A FACE TO FACE EVALUATION WAS PERFORMED   Charlett Blake 07/05/2022, 8:01 AM     LOS: 4 days A FACE TO FACE EVALUATION WAS PERFORMED  Charlett Blake 07/08/2022, 7:50 AM

## 2022-07-08 NOTE — Progress Notes (Signed)
Patient ID: Jonathan [name], male   DOB: [birth year], 47 y.o.   MRN: [id number]  SW met with patient to discuss questions or concerns before d/c today. Patient ready. SPC delivered. Shower chair declined per patient request. OP referral sent to Neuro Rehab. No additional questions or concerns.

## 2022-07-08 NOTE — Progress Notes (Signed)
Inpatient Rehabilitation Care Coordinator Discharge Note   Patient Details  Name: Jonathan [name] MRN: [id number] Date of Birth: 1975-12-09   Discharge location: Home with fiance and daughter  Length of Stay: 4 Days  Discharge activity level: MOD I  Home/community participation: Fiance  Patient response [id number] Literacy - How often do you need to have someone help you when you read instructions, pamphlets, or other written material from your doctor or pharmacy?: Never  Patient response [id number] Isolation - How often do you feel lonely or isolated from those around you?: Never  Services provided included: SW, Pharmacy, TR, CM, RN, SLP, OT, PT, RD, MD  Financial Services:  Financial Services Utilized: [employer]  Choices offered to/list presented to: Patient  Follow-up services arranged:  Outpatient, DME    Outpatient Servicies: [facility]- PT OT DME : [employer]: SPC and Shower seat    Patient response to transportation need: Is the patient able to respond to transportation needs?: Yes In the past 12 months, has lack of transportation kept you from medical appointments or from getting medications?: No In the past 12 months, has lack of transportation kept you from meetings, work, or from getting things needed for daily living?: No    Comments (or additional information):  Patient/Family verbalized understanding of follow-up arrangements:  Yes  Individual responsible for coordination of the follow-up plan: self [phone]  Confirmed correct DME delivered: Dyanne Iha 07/08/2022    Dyanne Iha

## 2022-07-08 NOTE — Progress Notes (Signed)
Patient ID: Jonathan [name], male   DOB: 04/25/1976, 47 y.o.   MRN: [id number]  Shower chair canceled through [employer] per patient and fiance request.

## 2022-07-08 NOTE — Progress Notes (Signed)
Patient ID: Jonathan [name], male   DOB: 1975/05/31, 47 y.o.   MRN: [id number]  New Patient Visit with [name], NP  Monday July 25, 2022 Arrive by 1:45 PM  [facility] [place] [id number] [phone]

## 2022-07-18 ENCOUNTER — Other Ambulatory Visit (HOSPITAL_COMMUNITY): Payer: Self-pay

## 2022-07-18 ENCOUNTER — Other Ambulatory Visit: Payer: Self-pay | Admitting: Physical Medicine and Rehabilitation

## 2022-07-18 ENCOUNTER — Other Ambulatory Visit (HOSPITAL_BASED_OUTPATIENT_CLINIC_OR_DEPARTMENT_OTHER): Payer: Self-pay

## 2022-07-18 MED ORDER — ASPIRIN 81 MG PO CHEW
81.0000 mg | CHEWABLE_TABLET | Freq: Every day | ORAL | 0 refills | Status: AC
  Filled 2022-07-18: qty 30, 30d supply, fill #0

## 2022-07-18 MED ORDER — CARVEDILOL 6.25 MG PO TABS
6.2500 mg | ORAL_TABLET | Freq: Two times a day (BID) | ORAL | 0 refills | Status: DC
  Filled 2022-07-18: qty 60, 30d supply, fill #0

## 2022-07-18 MED ORDER — TRAZODONE HCL 50 MG PO TABS
50.0000 mg | ORAL_TABLET | Freq: Every evening | ORAL | 0 refills | Status: DC | PRN
  Filled 2022-07-18: qty 30, 30d supply, fill #0

## 2022-07-18 MED ORDER — GABAPENTIN 100 MG PO CAPS
100.0000 mg | ORAL_CAPSULE | Freq: Every day | ORAL | 0 refills | Status: DC
  Filled 2022-07-18: qty 30, 30d supply, fill #0

## 2022-07-18 MED ORDER — BACLOFEN 5 MG PO TABS
5.0000 mg | ORAL_TABLET | Freq: Three times a day (TID) | ORAL | 0 refills | Status: DC | PRN
  Filled 2022-07-18: qty 30, 10d supply, fill #0

## 2022-07-18 MED ORDER — ATORVASTATIN CALCIUM 20 MG PO TABS
20.0000 mg | ORAL_TABLET | Freq: Every day | ORAL | 0 refills | Status: AC
  Filled 2022-07-18: qty 30, 30d supply, fill #0

## 2022-07-18 MED ORDER — IRBESARTAN 75 MG PO TABS
75.0000 mg | ORAL_TABLET | Freq: Every day | ORAL | 0 refills | Status: DC
  Filled 2022-07-18: qty 30, 30d supply, fill #0

## 2022-07-18 MED ORDER — AMLODIPINE BESYLATE 10 MG PO TABS
10.0000 mg | ORAL_TABLET | Freq: Every day | ORAL | 0 refills | Status: DC
  Filled 2022-07-18: qty 30, 30d supply, fill #0

## 2022-07-25 ENCOUNTER — Ambulatory Visit: Payer: Commercial Managed Care - HMO | Admitting: Family

## 2022-07-27 ENCOUNTER — Ambulatory Visit: Payer: Commercial Managed Care - HMO | Admitting: Speech Pathology

## 2022-07-27 ENCOUNTER — Encounter: Payer: Self-pay | Admitting: Occupational Therapy

## 2022-07-27 ENCOUNTER — Ambulatory Visit: Payer: Self-pay | Attending: Physical Medicine and Rehabilitation | Admitting: Physical Therapy

## 2022-07-27 ENCOUNTER — Ambulatory Visit: Payer: Commercial Managed Care - HMO | Admitting: Occupational Therapy

## 2022-07-27 VITALS — BP 144/86 | HR 78

## 2022-07-27 DIAGNOSIS — R29818 Other symptoms and signs involving the nervous system: Secondary | ICD-10-CM

## 2022-07-27 DIAGNOSIS — I639 Cerebral infarction, unspecified: Secondary | ICD-10-CM | POA: Insufficient documentation

## 2022-07-27 DIAGNOSIS — R41842 Visuospatial deficit: Secondary | ICD-10-CM

## 2022-07-27 DIAGNOSIS — R41841 Cognitive communication deficit: Secondary | ICD-10-CM

## 2022-07-27 DIAGNOSIS — R278 Other lack of coordination: Secondary | ICD-10-CM | POA: Insufficient documentation

## 2022-07-27 DIAGNOSIS — R2681 Unsteadiness on feet: Secondary | ICD-10-CM

## 2022-07-27 DIAGNOSIS — I69354 Hemiplegia and hemiparesis following cerebral infarction affecting left non-dominant side: Secondary | ICD-10-CM

## 2022-07-27 DIAGNOSIS — M6281 Muscle weakness (generalized): Secondary | ICD-10-CM

## 2022-07-27 DIAGNOSIS — R296 Repeated falls: Secondary | ICD-10-CM

## 2022-07-27 DIAGNOSIS — R2689 Other abnormalities of gait and mobility: Secondary | ICD-10-CM

## 2022-07-27 NOTE — Patient Instructions (Signed)

## 2022-07-27 NOTE — Therapy (Signed)
OUTPATIENT PHYSICAL THERAPY NEURO EVALUATION   Patient Name: Jonathan [name] MRN: [id number] DOB:June 15, 1975, 47 y.o., male Today's Date: 07/27/2022   PCP: None on file REFERRING PROVIDER: Bary Leriche, PA-C  END OF SESSION:  PT End of Session - 07/27/22 0939     Visit Number 1    Number of Visits 13   Plus eval   Date for PT Re-Evaluation 09/28/22    Authorization Type [employer] (30 VL PT/OT hard max)    PT Start Time 0935    PT Stop Time 1007   Eval   PT Time Calculation (min) 32 min    Equipment Utilized During Treatment Gait belt    Activity Tolerance Patient tolerated treatment well    Behavior During Therapy WFL for tasks assessed/performed             Past Medical History:  Diagnosis Date   Hypertension    Lower back pain    No past surgical history on file. Patient Active Problem List   Diagnosis Date Noted   HTN (hypertension) 07/08/2022   Acute renal failure superimposed on chronic kidney disease ([place]) 07/08/2022   Elevated lipids 07/08/2022   Right basal ganglia embolic stroke ([place]) 0000000   Acute ischemic stroke ([place]) 06/30/2022   Noncompliance with medication regimen 06/30/2022   Essential hypertension 11/03/2021   Chest pain 12/21/2011   Tobacco abuse 12/21/2011   Elevated blood pressure 12/21/2011   Alcoholism ([place]) 12/21/2011   VERTIGO NOS OR DIZZINESS 07/13/2006    ONSET DATE: 07/07/2022  REFERRING DIAG: I63.9 (ICD-10-CM) - Cerebral infarction, unspecified  THERAPY DIAG:  Hemiplegia and hemiparesis following cerebral infarction affecting left non-dominant side (HCC)  Muscle weakness (generalized)  Other abnormalities of gait and mobility  Unsteadiness on feet  Repeated falls  Rationale for Evaluation and Treatment: Rehabilitation  SUBJECTIVE:                                                                                                                                                                                              SUBJECTIVE STATEMENT: Pt ambulated into clinic w/SPC. Reports he is having difficulty bending over, carrying things around and rolling in bed. Has had 2 falls since being home, was walking without AD inside house during the day. Was able to get up on his own and denies injuries. Has been mostly sitting in bed and watching TV at home.    Pt accompanied by: significant other  PERTINENT HISTORY: 47 y.o. male with of untreated hypertension and tobacco use who was admitted on 06/30/2022 with left arm and left leg weakness with that started the night  before.  CTA head/neck was negative for LVO.  UDS was positive for cocaine.  MRI brain showed small acute infarct in white matter tracts in right basal ganglia and overlying frontal lobe.  Dr. 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  He recommended DAPT x 3 weeks followed by ASA alone as well as participation in sleep Study given risk of sleep apnea.  Patient was independent prior to admission and therapy evaluations revealed deficits due to LLE weakness with knee instability, LUE weakness with sensory deficits as well as decrease in safety awareness.  PAIN:  Are you having pain? No Pt reports frequent pain in LUE, distal to elbow   VITALS  Vitals:   07/27/22 0949  BP: (!) 144/86  Pulse: 78     PRECAUTIONS: Fall  WEIGHT BEARING RESTRICTIONS: No  FALLS: Has patient fallen in last 6 months? Yes. Number of falls 2  LIVING ENVIRONMENT: Lives with: lives with their family and lives with their partner Lives in: House/apartment Stairs: Yes: External: 3 steps; none Has following equipment at home: Single point cane  PLOF: Independent  PATIENT GOALS: "Get back in shape and move around like I used to"  OBJECTIVE:   DIAGNOSTIC FINDINGS: MRI of brain on 06/30/22 IMPRESSION: Small acute infarct in the descending white matter tracks of the right basal ganglia and overlying frontal lobe.    COGNITION: Overall  cognitive status: Within functional limits for tasks assessed   SENSATION: Pt reports constant numbness/tingling in L toes, intermittent numbness in LLE  COORDINATION: Heel to shin test: WFL on RLE, unable to perform on LLE   POSTURE: rounded shoulders, forward head, and weight shift right   LOWER EXTREMITY MMT:  Tested in seated position   MMT Right Eval Left Eval  Hip flexion 4+ 3-  Hip extension    Hip abduction 4 3-  Hip adduction 4 3-  Hip internal rotation    Hip external rotation    Knee flexion 5 3-  Knee extension 5 3+  Ankle dorsiflexion 5 3-  Ankle plantarflexion    Ankle inversion    Ankle eversion    (Blank rows = not tested)  BED MOBILITY:  Independent per pt, uses stepstool to get into bed due to height   TRANSFERS: Assistive device utilized: None  Sit to stand: CGA Stand to sit: CGA Heavy reliance on RUE support and poor immediate standing balance w/wide BOS and bracing against chair    GAIT: Gait pattern: step to pattern, decreased step length- Right, decreased stance time- Left, decreased stride length, decreased hip/knee flexion- Left, decreased ankle dorsiflexion- Left, knee flexed in stance- Left, lateral lean- Right, wide BOS, and poor foot clearance- Left Distance walked: Various clinic distances  Assistive device utilized: Single point cane Level of assistance: CGA and Min A Comments: Pt demonstrates very poor knee control on L side and heavy lateral lean to R side to compensate. Pt wearing crocs and caught L foot on ground during gait velocity assessment, requiring min A to prevent fall   FUNCTIONAL TESTS:   [facility] PT Assessment - 07/27/22 0955       Transfers   Five time sit to stand comments  18.83s w/RUE support   Poor immediate standing balance and significant weight shift to R side     Ambulation/Gait   Gait velocity 32.8' over 18.77s = 1.75 ft/s w/SPC and CGA      Balance   Balance Assessed Yes  Standardized Balance  Assessment   Standardized Balance Assessment Timed Up and Go Test      Timed Up and Go Test   Normal TUG (seconds) 17.31   w/SPC; 18s w/o AD w/Min A             PATIENT SURVEYS:  FOTO 50  TODAY'S TREATMENT:          Next Session                                                                                                                        PATIENT EDUCATION: Education details: POC, eval findings. Educated pt and fiance on importance of using AD AT [place] at home due to safety concerns and frequent falls without use of AD. Importance of adherence to walking program and HEP given pt's limited visits  Person educated: Patient and Fiance Education method: Explanation Education comprehension: verbalized understanding and needs further education  HOME EXERCISE PROGRAM: To be established   GOALS: Goals reviewed with patient? Yes  SHORT TERM GOALS: Target date: 08/24/2022   Pt will perform initial HEP and walking program w/CGA from fiance for improved strength, balance, transfers and gait.  Baseline: Not established on eval  Goal status: INITIAL  2.  Berg to be assessed and STG/LTG written  Baseline:  Goal status: INITIAL  3.  Pt will improve 5 x STS to less than or equal to 18 seconds w/RUE support and good immediate standing balance and S* to demonstrate improved functional strength and transfer efficiency.   Baseline: 18.83s w/heavy lean to R side and poor immediate standing balance w/CGA Goal status: INITIAL  4.  Pt will improve normal TUG to less than or equal to 15 seconds w/LRAD and S* for improved functional mobility and decreased fall risk.  Baseline: 17.31s w/SPC and CGA Goal status: INITIAL  5.  Pt will improve gait velocity to at least 2.0 ft/s w/LRAD and S* for improved gait efficiency and reduced recurrent fall risk   Baseline: 1.75 ft/s w/SPC and CGA Goal status: INITIAL  6.  Stairs to be assessed and goal updated Baseline:  Goal status:  INITIAL  LONG TERM GOALS: Target date: 09/21/2022   Pt will be independent with final HEP and walking program for improved strength, balance, transfers and gait.  Baseline:  Goal status: INITIAL  2.  Berg goal Baseline:  Goal status: INITIAL  3.  Pt will improve 5 x STS to less than or equal to 15 seconds w/RUE support mod I to demonstrate improved functional strength and transfer efficiency.   Baseline: 18.83s w/heavy lean to R side and poor immediate standing balance w/CGA Goal status: INITIAL  4.  Pt will improve normal TUG to less than or equal to 12 seconds w/LRAD mod I for improved functional mobility and decreased fall risk.  Baseline: 17.31s w/SPC and CGA Goal status: INITIAL  5.  Pt will improve FOTO to 69 for improved self-perceived functional ability and return to PLOF  Baseline: 50 (3/13) Goal status: INITIAL  6.  Pt will improve gait velocity to at least 2.5 ft/s w/LRAD mod I for improved gait efficiency  Baseline: 1.75 ft/s w/SPC and CGA Goal status: INITIAL  ASSESSMENT:  CLINICAL IMPRESSION: Patient is a 47 year old male referred to Neuro OPPT for CVA.   Pt's PMH is significant for: untreated hypertension and tobacco use. The following deficits were present during the exam: decreased strength, decreased activity tolerance, decreased safety awareness, impaired balance and impaired sensation. Based on history of falls, gait speed and TUG, pt is an incr risk for falls. Balance to be further assessed next session. Pt would benefit from skilled PT to address these impairments and functional limitations to maximize functional mobility independence.   OBJECTIVE IMPAIRMENTS: Abnormal gait, decreased activity tolerance, decreased balance, decreased coordination, decreased endurance, decreased knowledge of condition, decreased knowledge of use of DME, decreased mobility, difficulty walking, decreased strength, decreased safety awareness, impaired sensation, impaired UE  functional use, improper body mechanics, and pain  ACTIVITY LIMITATIONS: carrying, lifting, bending, squatting, sleeping, stairs, transfers, bed mobility, dressing, reach over head, hygiene/grooming, locomotion level, and caring for others  PARTICIPATION LIMITATIONS: meal prep, cleaning, laundry, medication management, personal finances, interpersonal relationship, driving, shopping, community activity, occupation, and yard work  PERSONAL FACTORS: Education, Fitness, Past/current experiences, and 1 comorbidity: HTN  are also affecting patient's functional outcome.   REHAB POTENTIAL: Good  CLINICAL DECISION MAKING: Evolving/moderate complexity  EVALUATION COMPLEXITY: Moderate  PLAN:  PT FREQUENCY:  2x/week for 4 weeks and 1x/week for 4 weeks  PT DURATION: 8 weeks  PLANNED INTERVENTIONS: Therapeutic exercises, Therapeutic activity, Neuromuscular re-education, Balance training, Gait training, Patient/Family education, Self Care, Joint mobilization, Stair training, Vestibular training, Canalith repositioning, Orthotic/Fit training, DME instructions, Aquatic Therapy, Dry Needling, Manual therapy, and Re-evaluation  PLAN FOR NEXT SESSION: Check BP. Berg and stairs and update goal. Initial HEP for LLE NMR/strength: Hip flex/add/abd, sit <>stands, weight shifting    Cruzita Lederer Clydine Parkison, PT, DPT 07/27/2022, 10:23 AM

## 2022-07-27 NOTE — Therapy (Signed)
OUTPATIENT OCCUPATIONAL THERAPY NEURO EVALUATION  Patient Name: Jonathan [name] MRN: [id number] DOB:[birth year], 47 y.o., male Today's Date: 07/27/2022  PCP: None on file  REFERRING PROVIDER: Love, Ivan Anchors, PA-C   END OF SESSION:  OT End of Session - 07/27/22 1011     Visit Number 1    Number of Visits 16    Date for OT Re-Evaluation 10/21/22    Authorization Type [employer] [place] - 30 (OT and PT combined), no auth required    OT Start Time 1015    OT Stop Time 1100    OT Time Calculation (min) 45 min    Activity Tolerance Patient tolerated treatment well    Behavior During Therapy WFL for tasks assessed/performed             Past Medical History:  Diagnosis Date   Hypertension    Lower back pain    History reviewed. No pertinent surgical history. Patient Active Problem List   Diagnosis Date Noted   HTN (hypertension) 07/08/2022   Acute renal failure superimposed on chronic kidney disease ([place]) 07/08/2022   Elevated lipids 07/08/2022   Right basal ganglia embolic stroke ([place]) 0000000   Acute ischemic stroke ([place]) 06/30/2022   Noncompliance with medication regimen 06/30/2022   Essential hypertension 11/03/2021   Chest pain 12/21/2011   Tobacco abuse 12/21/2011   Elevated blood pressure 12/21/2011   Alcoholism ([place]) 12/21/2011   VERTIGO NOS OR DIZZINESS 07/13/2006    ONSET DATE:  06/29/2022   REFERRING DIAG: I63.9 (ICD-10-CM) - Cerebral infarction, unspecified   THERAPY DIAG:  Muscle weakness (generalized)  Hemiplegia and hemiparesis following cerebral infarction affecting left non-dominant side (HCC)  Right basal ganglia embolic stroke (HCC)  Other lack of coordination  Visuospatial deficit  Other symptoms and signs involving the nervous system  Rationale for Evaluation and Treatment: Rehabilitation  SUBJECTIVE:   SUBJECTIVE STATEMENT: He did not have as much movement in his LUE while he was in the hospital. Sometimes his arm just "jumps".  Pt  accompanied by: significant other - Tia  PERTINENT HISTORY:  "47 y.o. male with of untreated hypertension and tobacco use who was admitted on 06/30/2022 with left arm and left leg weakness with that started the night before.  CTA head/neck was negative for LVO.  UDS was positive for cocaine.  MRI brain showed small acute infarct in white matter tracts in right basal ganglia and overlying frontal lobe.  Dr. 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  He recommended DAPT x 3 weeks followed by ASA alone as well as participation in sleep Study given risk of sleep apnea.  Patient was independent prior to admission and therapy evaluations revealed deficits due to LLE weakness with knee instability, LUE weakness with sensory deficits as well as decrease in safety awareness."   PRECAUTIONS: Fall  WEIGHT BEARING RESTRICTIONS: No  PAIN:  Are you having pain? No and does have tingling in L hand  FALLS: Has patient fallen in last 6 months? Yes. Number of falls 2; fell last week  LIVING ENVIRONMENT: Lives with: lives with their family Lives in: House/apartment Stairs: Yes: Internal: 2 to get into the bed steps; none and External: 3 steps; none Has following equipment at home: Single point cane  PLOF: Independent; driving; was not working  PATIENT GOALS: return to PLOF  OBJECTIVE:   HAND DOMINANCE: Right  ADLs: Eating: requires assistance to cut food Grooming: requires assistance to put on deodorant, shaves with R  UB Dressing: min A LB Dressing: min A Toileting: min A Bathing: min A  IADLs: Light housekeeping: max A Meal Prep: min A Community mobility: dependent Medication management: dependent  MOBILITY STATUS: Needs Assist: CGA to Min A with SPC  ACTIVITY TOLERANCE: Activity tolerance: fair  FUNCTIONAL OUTCOME MEASURES: FOTO: 54 (moderate severity)    UPPER EXTREMITY ROM:     AROM Right (eval) Left (eval)  Shoulder flexion WNL WNL  Shoulder  abduction WNL WNL  Elbow flexion WNL WNL  Elbow extension WNL WNL  Wrist flexion WNL WNL  Wrist extension WNL WNL  Wrist pronation WNL WNL  Wrist supination WNL WNL   Digit Composite Flexion WNL WNL  Digit Composite Extension WNL WNL  Digit Opposition WNL bradykinesia  (Blank rows = not tested)  UPPER EXTREMITY MMT:     MMT Right (eval) Left (eval)  Shoulder flexion WNL BFL  Shoulder abduction WNL BFL  Elbow flexion WNL BFL  Elbow extension WNL BFL  (Blank rows = not tested)  HAND FUNCTION: Grip strength: Right: 109.1 lbs; Left: 16.7 lbs  COORDINATION: 9 Hole Peg test: Right: 31 sec; Left: 50 sec  SENSATION: Paresthesias and tingling in L hand  EDEMA: mild observed at eval; edema reported  MUSCLE TONE: LUE: Within functional limits  COGNITION: Overall cognitive status: Within functional limits for tasks assessed  VISION: Subjective report: blurry near vision; diplopia reported with near vision Baseline vision: No visual deficits Visual history:  N/A  VISION ASSESSMENT: WFL  PERCEPTION: WFL  PRAXIS: WFL  OBSERVATIONS: Pt appears well-kept. LUE AROM WFL with increased time due to bradykinesia.   TODAY'S TREATMENT:                                                                                                                              - Neuro re-education completed for duration as noted below including: OT initiated dowel AAROM HEP including shoulder flex, ER, shoulder horizontal abduction and adduction, and chest press for improved ROM of affected extremity.   PATIENT EDUCATION: Education details: OT role and POC; dowel HEP Person educated: Patient and significant other Education method: Explanation, Demonstration, and Handouts Education comprehension: verbalized understanding, returned demonstration, and needs further education  HOME EXERCISE PROGRAM: 3/13: dowel HEP  GOALS:  SHORT TERM GOALS: Target date: 08/24/2022   Patient will be  independent with initial LUE HEP. Baseline: Goal status: INITIAL  2.  Patient will demonstrate at least 25 lbs L grip strength as needed to open jars and other containers. Baseline: Left: 16.7 lbs Goal status: INITIAL  3.  Patient will be independent with diplopia HEP. Baseline:  Goal status: INITIAL  LONG TERM GOALS: Target date: 10/21/2022  Patient will demonstrate updated LUE HEP with 25% verbal cues or less for proper execution. Baseline:  Goal status: INITIAL  2.  Patient will complete nine-hole peg with use of L in 35 seconds or less. Baseline: Left: 50 sec Goal status: INITIAL  3.  Pt will complete d/c FOTO Baseline:  Goal status: INITIAL  4.  Patient will demonstrate at least 35 lbs L grip strength as needed to open jars and other containers. Baseline: Left: 16.7 lbs Goal status: INITIAL  5.  Pt will report no more than a little difficulty placing a can of soup (1lb) on a shelf at shoulder height using LUE.  Baseline: much difficulty Goal status: INITIAL  ASSESSMENT:  CLINICAL IMPRESSION: Patient is a 47 y.o. male who was seen today for occupational therapy evaluation for CVA. Hx includes vertigo, HTN, alcoholism, medical noncompliance, and falls. Patient currently presents below baseline level of functioning demonstrating functional deficits and impairments as noted below. Pt would benefit from skilled OT services in the outpatient setting to work on impairments as noted below to help pt return to PLOF as able.    PERFORMANCE DEFICITS: in functional skills including ADLs, IADLs, coordination, sensation, ROM, strength, Fine motor control, Gross motor control, mobility, endurance, vision, and UE functional use.   IMPAIRMENTS: are limiting patient from ADLs, IADLs, work, and social participation.   CO-MORBIDITIES: may have co-morbidities  that affects occupational performance. Patient will benefit from skilled OT to address above impairments and improve overall  function.  MODIFICATION OR ASSISTANCE TO COMPLETE EVALUATION: No modification of tasks or assist necessary to complete an evaluation.  OT OCCUPATIONAL PROFILE AND HISTORY: Problem focused assessment: Including review of records relating to presenting problem.  CLINICAL DECISION MAKING: LOW - limited treatment options, no task modification necessary  REHAB POTENTIAL: Good  EVALUATION COMPLEXITY: Low    PLAN:  OT FREQUENCY: 2x/week for 4 weeks and 1x/week for 4 weeks   OT DURATION:  12 sessions+eval  PLANNED INTERVENTIONS: self care/ADL training, therapeutic exercise, therapeutic activity, neuromuscular re-education, manual therapy, passive range of motion, functional mobility training, splinting, electrical stimulation, ultrasound, paraffin, fluidotherapy, moist heat, cryotherapy, contrast bath, patient/family education, visual/perceptual remediation/compensation, and Re-evaluation  RECOMMENDED OTHER SERVICES: none at this time  CONSULTED AND AGREED WITH PLAN OF CARE: Patient and significant other  PLAN FOR NEXT SESSION: review dowel HEP; initiate coordination and putty HEP; diplopia HEP   Dennis Bast, OT 07/27/2022, 2:13 PM

## 2022-07-27 NOTE — Therapy (Signed)
OUTPATIENT SPEECH LANGUAGE PATHOLOGY EVALUATION   Patient Name: Jonathan [name] MRN: [id number] DOB:Dec 12, 1975, 47 y.o., male Today's Date: 07/27/2022  PCP: none REFERRING PROVIDER: Larina Earthly, MD  END OF SESSION:  End of Session - 07/27/22 1103     Activity Tolerance Patient tolerated treatment well             Past Medical History:  Diagnosis Date   Hypertension    Lower back pain    No past surgical history on file. Patient Active Problem List   Diagnosis Date Noted   HTN (hypertension) 07/08/2022   Acute renal failure superimposed on chronic kidney disease ([place]) 07/08/2022   Elevated lipids 07/08/2022   Right basal ganglia embolic stroke ([place]) 0000000   Acute ischemic stroke ([place]) 06/30/2022   Noncompliance with medication regimen 06/30/2022   Essential hypertension 11/03/2021   Chest pain 12/21/2011   Tobacco abuse 12/21/2011   Elevated blood pressure 12/21/2011   Alcoholism ([place]) 12/21/2011   VERTIGO NOS OR DIZZINESS 07/13/2006    ONSET DATE: 06/29/2022   REFERRING DIAG: I63.9 (ICD-10-CM) - Cerebral infarction, unspecified   THERAPY DIAG:  Cognitive communication deficit  Rationale for Evaluation and Treatment: Rehabilitation  SUBJECTIVE:   SUBJECTIVE STATEMENT: "I think it's going well" Pt accompanied by: significant other  PERTINENT HISTORY: 47 y.o. male with of untreated hypertension and tobacco use who was admitted on 06/30/2022 with left arm and left leg weakness with that started the night before.  CTA head/neck was negative for LVO.  UDS was positive for cocaine.  MRI brain showed small acute infarct in white matter tracts in right basal ganglia and overlying frontal lobe.  Dr. 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  He recommended DAPT x 3 weeks followed by ASA alone as well as participation in sleep Study given risk of sleep apnea.  Patient was independent prior to admission and therapy evaluations  revealed deficits due to LLE weakness with knee instability, LUE weakness with sensory deficits as well as decrease in safety awareness.   PAIN:  Are you having pain? No  FALLS: Has patient fallen in last 6 months?  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  COGNITION: Overall cognitive status: Impaired Areas of impairment:  Memory: Impaired: Short term Prospective Executive function: Impaired: Slow processing Functional deficits: pt denies, reports baseline of memory challenges which are only slightly exacerbated by acute medical event  COGNITIVE COMMUNICATION: Following directions: Follows multi-step commands consistently  Auditory comprehension: WFL Verbal expression: WFL Functional communication: WFL  ORAL MOTOR EXAMINATION: Overall status: Did not assess  STANDARDIZED ASSESSMENTS: CLQT: Attention: WNL, Memory: Mild, Executive Function: WNL, Language: Mild, Visuospatial Skills: WNL, and Clock Drawing: WNL  PATIENT REPORTED OUTCOME MEASURES (PROM): deferred   TODAY'S TREATMENT:  07/27/2022: SLP provided education on internal memory strategies and external memory aids to support memory. Strategies and compensations to support processing.    PATIENT EDUCATION: Education details: see above Person educated: Patient and Spouse Education method: Explanation, Demonstration, and Handouts Education comprehension: verbalized understanding and returned demonstration   ASSESSMENT:  CLINICAL IMPRESSION: Patient is a 47 y.o.  M who was seen today for cognitive linguistic  s/p CVA. Evaluation reveals mild memory and expressive language deficits Pt's and significant other report baseline memory issues which are only slightly exacerbated by acute medical event. Provided pt with education on memory strategies and compensations. Encouraged significant other to support implementation of external memory aids to support recall, with her verbalizing agreement. Pt not currently participating in home based activities c/w prior level, though pt and significant other endorse this is d/t physical limitations and fatigue, not cognitive functioning. Motivational interview does not reveal functional deficits requiring ST interventions at this time. SLP provided education on evaluation results and recommendations. Advised pt will need new referral should needs change, he verbalizes understanding.    OBJECTIVE IMPAIRMENTS: include memory and expressive language.   PLAN:  SLP FREQUENCY: one time visit   Su Monks, [place] 07/27/2022, 11:07 AM

## 2022-07-29 ENCOUNTER — Encounter: Payer: Commercial Managed Care - HMO | Admitting: Speech Pathology

## 2022-07-29 ENCOUNTER — Encounter: Payer: Commercial Managed Care - HMO | Admitting: Occupational Therapy

## 2022-07-29 ENCOUNTER — Ambulatory Visit: Payer: Commercial Managed Care - HMO

## 2022-08-05 ENCOUNTER — Ambulatory Visit: Payer: Commercial Managed Care - HMO

## 2022-08-05 ENCOUNTER — Encounter: Payer: Self-pay | Admitting: Physical Medicine & Rehabilitation

## 2022-08-05 ENCOUNTER — Encounter: Payer: Medicaid Other | Attending: Physical Medicine & Rehabilitation | Admitting: Physical Medicine & Rehabilitation

## 2022-08-05 ENCOUNTER — Encounter: Payer: Commercial Managed Care - HMO | Admitting: Speech Pathology

## 2022-08-05 VITALS — BP 144/91 | HR 85 | Wt 256.0 lb

## 2022-08-05 VITALS — BP 161/98 | HR 96

## 2022-08-05 DIAGNOSIS — R2689 Other abnormalities of gait and mobility: Secondary | ICD-10-CM

## 2022-08-05 DIAGNOSIS — G8114 Spastic hemiplegia affecting left nondominant side: Secondary | ICD-10-CM | POA: Diagnosis not present

## 2022-08-05 DIAGNOSIS — R2681 Unsteadiness on feet: Secondary | ICD-10-CM

## 2022-08-05 DIAGNOSIS — R278 Other lack of coordination: Secondary | ICD-10-CM

## 2022-08-05 DIAGNOSIS — M6281 Muscle weakness (generalized): Secondary | ICD-10-CM

## 2022-08-05 MED ORDER — GABAPENTIN 100 MG PO CAPS: 100.0000 mg | ORAL_CAPSULE | Freq: Every day | ORAL | 5 refills | Status: DC

## 2022-08-05 MED ORDER — BACLOFEN 5 MG PO TABS: 5.0000 mg | ORAL_TABLET | Freq: Three times a day (TID) | ORAL | 5 refills | Status: AC | PRN

## 2022-08-05 NOTE — Therapy (Signed)
OUTPATIENT PHYSICAL THERAPY NEURO TREATMENT   Patient Name: Jonathan [name] MRN: [id number] DOB:1975/09/29, 47 y.o., male Today's Date: 08/05/2022   PCP: None on file REFERRING PROVIDER: Bary Leriche, PA-C  END OF SESSION:  PT End of Session - 08/05/22 0843     Visit Number 2    Number of Visits 13    Date for PT Re-Evaluation 09/28/22    Authorization Type [employer] (30 VL PT/OT hard max)    PT Start Time 0845    PT Stop Time 0926    PT Time Calculation (min) 41 min    Equipment Utilized During Treatment Gait belt    Activity Tolerance Patient tolerated treatment well    Behavior During Therapy WFL for tasks assessed/performed             Past Medical History:  Diagnosis Date   Hypertension    Lower back pain    History reviewed. No pertinent surgical history. Patient Active Problem List   Diagnosis Date Noted   HTN (hypertension) 07/08/2022   Acute renal failure superimposed on chronic kidney disease ([place]) 07/08/2022   Elevated lipids 07/08/2022   Right basal ganglia embolic stroke ([place]) 0000000   Acute ischemic stroke ([place]) 06/30/2022   Noncompliance with medication regimen 06/30/2022   Essential hypertension 11/03/2021   Chest pain 12/21/2011   Tobacco abuse 12/21/2011   Elevated blood pressure 12/21/2011   Alcoholism ([place]) 12/21/2011   VERTIGO NOS OR DIZZINESS 07/13/2006    ONSET DATE: 07/07/2022  REFERRING DIAG: I63.9 (ICD-10-CM) - Cerebral infarction, unspecified  THERAPY DIAG:  Muscle weakness (generalized)  Other lack of coordination  Other abnormalities of gait and mobility  Unsteadiness on feet  Rationale for Evaluation and Treatment: Rehabilitation  SUBJECTIVE:                                                                                                                                                                                             SUBJECTIVE STATEMENT: Patient reports doing well. No acute changes. Reports using  the SPC ~40% of the time. Has started to cut back on cigarette use. No falls/near falls.    PERTINENT HISTORY: 47 y.o. male with of untreated hypertension and tobacco use who was admitted on 06/30/2022 with left arm and left leg weakness with that started the night before.  CTA head/neck was negative for LVO.  UDS was positive for cocaine.  MRI brain showed small acute infarct in white matter tracts in right basal ganglia and overlying frontal lobe.  Dr. 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  He recommended DAPT  x 3 weeks followed by ASA alone as well as participation in sleep Study given risk of sleep apnea.  Patient was independent prior to admission and therapy evaluations revealed deficits due to LLE weakness with knee instability, LUE weakness with sensory deficits as well as decrease in safety awareness.  PAIN:  Are you having pain? No Pt reports frequent pain in LUE, distal to elbow   VITALS  Vitals:   08/05/22 0851  BP: (!) 161/98  Pulse: 96  BP after scifit: 148/92 HR: 96  PRECAUTIONS: Fall  PATIENT GOALS: "Get back in shape and move around like I used to"  TODAY'S TREATMENT:           [facility] PT Assessment - 08/05/22 0001       Standardized Balance Assessment   Standardized Balance Assessment Berg Balance Test      Berg Balance Test   Sit to Stand Able to stand without using hands and stabilize independently    Standing Unsupported Able to stand 2 minutes with supervision    Sitting with Back Unsupported but Feet Supported on Floor or Stool Able to sit safely and securely 2 minutes    Stand to Sit Sits safely with minimal use of hands    Transfers Able to transfer safely, minor use of hands    Standing Unsupported with Eyes Closed Able to stand 10 seconds with supervision    Standing Unsupported with Feet Together Able to place feet together independently and stand for 1 minute with supervision    From Standing, Reach Forward with  Outstretched Arm Can reach forward >5 cm safely (2")    From Standing Position, Pick up Object from [place] to pick up shoe, needs supervision    From Standing Position, Turn to Look Behind Over each Shoulder Looks behind one side only/other side shows less weight shift    Turn 360 Degrees Able to turn 360 degrees safely but slowly    Standing Unsupported, Alternately Place Feet on Step/Stool Able to stand independently and complete 8 steps >20 seconds    Standing Unsupported, One Foot in Front Able to plae foot ahead of the other independently and hold 30 seconds    Standing on One Leg Able to lift leg independently and hold 5-10 seconds    Total Score 44            Stairs: CGA x4 steps with B HR                                                                                                                      HEP: -see below (red theraband)   Scifit x8 mins B UE/LE level 4 for   PATIENT EDUCATION: Education details: initial HEP Person educated: Patient and Fiance Education method: Explanation Education comprehension: verbalized understanding and needs further education  HOME EXERCISE PROGRAM: Access Code: [email or web address] URL: [email or web address] Date: 08/05/2022 Prepared by: Estevan Ryder  Exercises - Side Stepping with Resistance at Thighs  - 1 x daily -  7 x weekly - 3 sets - 10 reps - Forward Monster Walk with Resistance at [employer] and Counter Support  - 1 x daily - 7 x weekly - 3 sets - 10 reps - Backward Monster Walk with Resistance at [employer] and Counter Support  - 1 x daily - 7 x weekly - 3 sets - 10 reps - Standing 3-Way Leg Reach with Resistance at Ankles and Counter Support  - 1 x daily - 7 x weekly - 2 sets - 10 reps - Deadlift with Resistance  - 1 x daily - 7 x weekly - 2 sets - 10 reps  You Can Walk For A Certain Length Of Time Each Day                          Walk 5 minutes 2 times per day.             Increase 2  minutes every 3-4 days               Work up to 30 minutes (1-2 times per day).   GOALS: Goals reviewed with patient? Yes  SHORT TERM GOALS: Target date: 08/24/2022   Pt will perform initial HEP and walking program w/CGA from fiance for improved strength, balance, transfers and gait.  Baseline: Not established on eval  Goal status: INITIAL  2.  Patient will improve Berg balance scale score to >/= 50/56 to demonstrate improved balance and decreased risk for falls Baseline: 44/56 Goal status: REVISED  3.  Pt will improve 5 x STS to less than or equal to 18 seconds w/RUE support and good immediate standing balance and S* to demonstrate improved functional strength and transfer efficiency.   Baseline: 18.83s w/heavy lean to R side and poor immediate standing balance w/CGA Goal status: INITIAL  4.  Pt will improve normal TUG to less than or equal to 15 seconds w/LRAD and S* for improved functional mobility and decreased fall risk.  Baseline: 17.31s w/SPC and CGA Goal status: INITIAL  5.  Pt will improve gait velocity to at least 2.0 ft/s w/LRAD and S* for improved gait efficiency and reduced recurrent fall risk   Baseline: 1.75 ft/s w/SPC and CGA Goal status: INITIAL  6.  Patient will negotiate at least 4 steps with no HR (per home set up) and no more than supervision Baseline: B HR and CGA Goal status: REVISED  LONG TERM GOALS: Target date: 09/21/2022   Pt will be independent with final HEP and walking program for improved strength, balance, transfers and gait.  Baseline:  Goal status: INITIAL  2.  Berg goal Baseline: not necessary as patient scored 44/56 initially Goal status: INITIAL  3.  Pt will improve 5 x STS to less than or equal to 15 seconds w/RUE support mod I to demonstrate improved functional strength and transfer efficiency.   Baseline: 18.83s w/heavy lean to R side and poor immediate standing balance w/CGA Goal status: INITIAL  4.  Pt will improve normal TUG to less than or equal to 12  seconds w/LRAD mod I for improved functional mobility and decreased fall risk.  Baseline: 17.31s w/SPC and CGA Goal status: INITIAL  5.  Pt will improve FOTO to 69 for improved self-perceived functional ability and return to PLOF Baseline: 50 (3/13) Goal status: INITIAL  6.  Pt will improve gait velocity to at least 2.5 ft/s w/LRAD mod I for improved gait efficiency  Baseline: 1.75 ft/s w/SPC  and CGA Goal status: INITIAL  ASSESSMENT:  CLINICAL IMPRESSION: Patient seen for skilled PT session with emphasis on completing balance assessment and initiating HEP. Patient demonstrated increased fall risk noted by score of 44/56 on the [facility] Scale.  <45/56 = fall risk, <42/56 = predictive of recurrent falls, <40/56 = 100% fall risk  >41 = independent, 21-40 = assistive device, 0-20 = wheelchair level  MDC 6.9 (4 pts 45-56, 5 pts 35-44, 7 pts 25-34) (ANPTA Core Set of Outcome Measures for Adults with Neurologic Conditions, 2018). Patient not overly compliant with use of AD at home, but has not suffered any additional falls. With fatigue- increasing trendelenburg noted. HEP provided to target hip and posterior chain strengthening. Continue POC.     OBJECTIVE IMPAIRMENTS: Abnormal gait, decreased activity tolerance, decreased balance, decreased coordination, decreased endurance, decreased knowledge of condition, decreased knowledge of use of DME, decreased mobility, difficulty walking, decreased strength, decreased safety awareness, impaired sensation, impaired UE functional use, improper body mechanics, and pain  ACTIVITY LIMITATIONS: carrying, lifting, bending, squatting, sleeping, stairs, transfers, bed mobility, dressing, reach over head, hygiene/grooming, locomotion level, and caring for others  PARTICIPATION LIMITATIONS: meal prep, cleaning, laundry, medication management, personal finances, interpersonal relationship, driving, shopping, community activity, occupation, and yard  work  PERSONAL FACTORS: Education, Fitness, Past/current experiences, and 1 comorbidity: HTN  are also affecting patient's functional outcome.   REHAB POTENTIAL: Good  CLINICAL DECISION MAKING: Evolving/moderate complexity  EVALUATION COMPLEXITY: Moderate  PLAN:  PT FREQUENCY:  2x/week for 4 weeks and 1x/week for 4 weeks  PT DURATION: 8 weeks  PLANNED INTERVENTIONS: Therapeutic exercises, Therapeutic activity, Neuromuscular re-education, Balance training, Gait training, Patient/Family education, Self Care, Joint mobilization, Stair training, Vestibular training, Canalith repositioning, Orthotic/Fit training, DME instructions, Aquatic Therapy, Dry Needling, Manual therapy, and Re-evaluation  PLAN FOR NEXT SESSION: Check BP. Review HEP PRN for LLE NMR/strength: Hip flex/add/abd, sit <>stands, weight shifting    Debbora Dus, PT, DPT, CBIS 08/05/2022, 9:46 AM

## 2022-08-05 NOTE — Patient Instructions (Addendum)
Lipoma- may consider removal in 62mo   Graduated return to driving instructions were provided. It is recommended that the patient first drives with another licensed driver in an empty parking lot. If the patient does well with this, and they can drive on a quiet street with the licensed driver. If the patient does well with this they can drive on a busy street with a licensed driver. If the patient does well with this, the next time out they can go by himself. For the first month after resuming driving, I recommend no nighttime or Interstate driving.

## 2022-08-05 NOTE — Progress Notes (Signed)
Subjective:    Patient ID: Jonathan [name], male    DOB: 11-25-1975, 47 y.o.   MRN: [id number] 46 y.o. male with of untreated hypertension and tobacco use who was admitted on 06/30/2022 with left arm and left leg weakness with that started the night before.  CTA head/neck was negative for LVO.  UDS was positive for cocaine.  MRI brain showed small acute infarct in white matter tracts in right basal ganglia and overlying frontal lobe.  Dr. 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  He recommended DAPT x 3 weeks followed by ASA alone as well as participation in sleep Study given risk of sleep apnea.  Patient was independent prior to admission and therapy evaluations revealed deficits due to LLE weakness with knee instability, LUE weakness with sensory deficits as well as decrease in safety awareness.  CIR was recommended due to functional decline.  Admit date: 07/04/2022 Discharge date: 07/08/2022   HPI 47 year old male with history of recent stroke has been living at home with fianc since discharge from inpatient rehab on 07/08/2022.  He is independent with all his self-care and mobility but continues to use a cane.  He was recently evaluated in outpatient therapy department.  Speech therapy does not feel like any further treatment is necessary however OT and PT are scheduling visits.  He lives in a two-level house with 4 steps to enter he is divorced living with his fiance with 2 children ages 44 and 26.  He can walk for minutes at a time he is able to climb steps.  He is not driving.  He would like to return to driving.  He was not working prior to admission Left arm weakness and numbness LLE still with mild weakness affecting ambulation   2 falls at home without injury , once pulling on paants   Pain Inventory Average Pain  3 Pain Right Now 3 My pain is intermittent, sharp, tingling, and aching  LOCATION OF PAIN  Head, thigh, finger, hip  BOWEL Number of  stools per week: 7   BLADDER Normal    Mobility walk with assistance use a cane ability to climb steps?  yes do you drive?  no use a wheelchair Do you have any goals in this area?  yes  Function disabled: date disabled Applied  I need assistance with the following:  dressing, bathing, meal prep, household duties, and shopping Do you have any goals in this area?  yes  Neuro/Psych weakness numbness tremor tingling trouble walking spasms dizziness depression anxiety  Prior Studies Any changes since last visit?  no  Physicians involved in your care Any changes since last visit?  no   Family History  Problem Relation Age of Onset   Cancer Mother    Diabetes Maternal Grandmother    Hypertension Other    Social History   Socioeconomic History   Marital status: Married    Spouse name: Not on file   Number of children: Not on file   Years of education: Not on file   Highest education level: Not on file  Occupational History   Not on file  Tobacco Use   Smoking status: Every Day    Packs/day: 1    Types: Cigarettes   Smokeless tobacco: Never  Vaping Use   Vaping Use: Never used  Substance and Sexual Activity   Alcohol use: Yes   Drug use: Yes    Types: Marijuana   Sexual activity:  Not on file  Other Topics Concern   Not on file  Social History Narrative   Not on file   Social Determinants of Health   Financial Resource Strain: Not on file  Food Insecurity: No Food Insecurity (06/30/2022)   Hunger Vital Sign    Worried About Running Out of Food in the Last Year: Never true    Ran Out of Food in the Last Year: Never true  Transportation Needs: No Transportation Needs (06/30/2022)   PRAPARE - Hydrologist (Medical): No    Lack of Transportation (Non-Medical): No  Physical Activity: Not on file  Stress: Not on file  Social Connections: Not on file   No past surgical history on file. Past Medical History:  Diagnosis  Date   Hypertension    Lower back pain    There were no vitals taken for this visit.  Opioid Risk Score:   Fall Risk Score:  `1  Depression screen PHQ 2/9      No data to display          Review of Systems  Musculoskeletal:  Positive for gait problem.  Neurological:  Positive for dizziness, tremors, weakness and numbness.       Spasms  Psychiatric/Behavioral:         Depression & Anxiety  All other systems reviewed and are negative.      Objective:   Physical Exam  General no acute distress Mood and affect are appropriate Speech without dysarthria or aphasia No evidence of field cut on examination. Cervical spine range of motion is normal Motor strength is 5/5 in the right deltoid, bicep, tricep, grip, hip flexor, knee extensor, ankle dorsiflexor and plantar flexor Left upper extremity strength is 4 - at the deltoid 4 at the bicep tricep and finger flexors and extensors.  Finger abduction is 3/5 Left lower extremity has 5/5 strength in hip flexor knee extensor 4/5 in the ankle dorsiflexor Fine motor mildly diminished finger thumb opposition on the left side especially with little finger to thumb. Positive dysdiadochokinesis with rapid alternating supination pronation of the left upper extremity. Ambulates with a cane no evidence of toe drag or knee instability.      Assessment & Plan:   #1.  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  We discussed avoiding alcohol more than 1 drink per day also all illicit drugs.  We discussed compliance with his medication regimen.  He has a primary care visit next week and should have enough of his antihypertensive medicines to the last through that time his blood pressure today appeared to be at the upper end of normal range. He does take his baclofen on a regular basis will refill baclofen 10 mg 3 times daily and his gabapentin 100 mg nightly.  I will see him back in 2 months. Graduated return  to driving instructions were provided. It is recommended that the patient first drives with another licensed driver in an empty parking lot. If the patient does well with this, and they can drive on a quiet street with the licensed driver. If the patient does well with this they can drive on a busy street with a licensed driver. If the patient does well with this, the next time out they can go by himself. For the first month after resuming driving, I recommend no nighttime or Interstate driving.

## 2022-08-09 ENCOUNTER — Encounter: Payer: Self-pay | Admitting: Family

## 2022-08-09 ENCOUNTER — Encounter: Payer: Self-pay | Admitting: Neurology

## 2022-08-09 ENCOUNTER — Ambulatory Visit: Payer: Commercial Managed Care - HMO | Admitting: Family

## 2022-08-09 ENCOUNTER — Inpatient Hospital Stay: Payer: Commercial Managed Care - HMO | Admitting: Neurology

## 2022-08-09 NOTE — Progress Notes (Deleted)
   New Patient Office Visit  Subjective:  Patient ID: Jonathan [name], male    DOB: November 09, 1975  Age: 47 y.o. MRN: [id number]  CC: No chief complaint on file.   HPI Jonathan [name] presents for establishing care today.  Assessment & Plan:  History of CVA (cerebrovascular accident)  Essential (primary) hypertension    Subjective:    Outpatient Medications Prior to Visit  Medication Sig Dispense Refill   acetaminophen (TYLENOL) 325 MG tablet Take 1-2 tablets (325-650 mg total) by mouth every 4 (four) hours as needed for mild pain.     amLODipine (NORVASC) 10 MG tablet Take 1 tablet (10 mg total) by mouth daily. 30 tablet 0   aspirin (ASPIRIN LOW DOSE) 81 MG chewable tablet Chew 1 tablet (81 mg total) by mouth daily. 30 tablet 0   atorvastatin (LIPITOR) 20 MG tablet Take 1 tablet (20 mg total) by mouth daily. 30 tablet 0   Baclofen 5 MG TABS Take 1 tablet (5 mg total) by mouth 3 (three) times daily as needed. 90 tablet 5   carvedilol (COREG) 6.25 MG tablet Take 1 tablet (6.25 mg total) by mouth 2 (two) times daily with a meal. 60 tablet 0   gabapentin (NEURONTIN) 100 MG capsule Take 1 capsule (100 mg total) by mouth at bedtime. 30 capsule 5   irbesartan (AVAPRO) 75 MG tablet Take 1 tablet (75 mg total) by mouth daily. 30 tablet 0   Multiple Vitamins-Minerals (MULTIVITAMIN MEN) TABS Take 1 tablet by mouth daily with breakfast.     nicotine (NICODERM CQ - DOSED IN MG/24 HR) 7 mg/24hr patch Place 1 patch (7 mg total) onto the skin daily. (Patient not taking: Reported on 08/05/2022) 28 patch 0   traZODone (DESYREL) 50 MG tablet Take 1 tablet (50 mg total) by mouth at bedtime as needed for sleep. 30 tablet 0   No facility-administered medications prior to visit.   Past Medical History:  Diagnosis Date   Chest pain 12/21/2011   Hypertension    Lower back pain    No past surgical history on file.  Objective:   Today's Vitals: There were no vitals taken for this  visit.  Physical Exam Vitals and nursing note reviewed.  Constitutional:      General: He is not in acute distress.    Appearance: Normal appearance.  HENT:     Head: Normocephalic.  Cardiovascular:     Rate and Rhythm: Normal rate and regular rhythm.  Pulmonary:     Effort: Pulmonary effort is normal.     Breath sounds: Normal breath sounds.  Musculoskeletal:        General: Normal range of motion.     Cervical back: Normal range of motion.  Skin:    General: Skin is warm and dry.  Neurological:     Mental Status: He is alert and oriented to person, place, and time.  Psychiatric:        Mood and Affect: Mood normal.     No orders of the defined types were placed in this encounter.   [name], NP

## 2022-08-09 NOTE — Progress Notes (Deleted)
Patient: Jonathan [name] Date of Birth: [birth year]  Reason for Visit: Follow up History from: Patient Primary Neurologist:    ASSESSMENT AND PLAN 47 y.o. year old male   38.  Stroke, right basal ganglia/corona radiata infarction -Etiology: Small vessel disease with cocaine use -Continue aspirin 81 mg daily  2.  Hypertension -BP goal less than 130/90  3.  Hyperlipidemia -LDL 81, goal less than 70 -On Lipitor 20  4.  Cocaine abuse  5.  Tobacco abuse   HISTORY OF PRESENT ILLNESS: Today 08/09/22 Jonathan [name] is here for stroke clinic follow-up.  Presented 06/30/2022 with left upper and lower extremity weakness, left-sided sensory deficit.  Found to have acute infarct in right basal ganglia.  UDS is positive for cocaine.  He went to inpatient rehab 2/19-2/23/24.  -CT head acute infarct in right basal ganglia -CTA head and neck no LVO -MRI of the brain acute infarct of right basal ganglia and corona radiata -2D echo EF 55 to 60% -LDL 81 -A1c 5.9 -UDS positive for cocaine -No antithrombotic prior to admission, aspirin 81 mg daily and Plavix 75 mg daily for 3 weeks, then aspirin alone  HISTORY  07/01/22 Dr. Leonie Man: History of Present Illness:  Jonathan [name] is an 47 y.o. African American male with PMH of hypertension and smoking who presented with left upper extremity weakness, left lower extremity weakness and left-sided sensory deficit.  Initial head CT showed small acute infarct in the right basal ganglia. MRI scan confirms a large right basal ganglia infarct extending into white matter.  CT angiogram of the brain and neck showed only mild atheromatous changes without large vessel stenosis or occlusion.  Urine drug screen is positive for cocaine.  Carotid ultrasound shows ejection fraction 55 to 60% without significant clot.  LDL cholesterol is 81 mg percent.  Hemoglobin A1c is 5.9.  Denies any prior history of strokes or TIAs.  Does admit to snoring and is  fianc feels she may have sleep apnea but has never been evaluated for the same Date last known well: Date: 06/29/2022 Time last known well: Time: 20:00 tPA Given: No: Presented outside of window MRS:  0 NIHSS:  3  06/30/22 Dr. Quinn Axe: This is a 49 man with hx HTN, tobacco abuse who presented to ED with LUE weakness and sensory deficit. LKW 2000 last night when he went to take a nap. A few hours later when he woke up his LLE felt weak and he was unable to ambulate. He was unable to lift his LUE against gravity or grip with his L hand. He did not seek medical attention until this afternoon. Exam is remarkable for LUE weakness and L face and LUE numbness. NIHSS = 3. CT head showed small acute infarct R basal ganglia personal review. TNK not administered 2/2 presentation outside the window. Exam and imaging not consistent with LVO and patient would have been outside the window for intervention 2/2 completed infarct on CT therefore CTA was not performed as part of the stroke code.   REVIEW OF SYSTEMS: Out of a complete 14 system review of symptoms, the patient complains only of the following symptoms, and all other reviewed systems are negative.  See HPI  ALLERGIES: No Known Allergies  HOME MEDICATIONS: Outpatient Medications Prior to Visit  Medication Sig Dispense Refill   acetaminophen (TYLENOL) 325 MG tablet Take 1-2 tablets (325-650 mg total) by mouth every 4 (four) hours as needed for mild pain.     amLODipine (  NORVASC) 10 MG tablet Take 1 tablet (10 mg total) by mouth daily. 30 tablet 0   aspirin (ASPIRIN LOW DOSE) 81 MG chewable tablet Chew 1 tablet (81 mg total) by mouth daily. 30 tablet 0   atorvastatin (LIPITOR) 20 MG tablet Take 1 tablet (20 mg total) by mouth daily. 30 tablet 0   Baclofen 5 MG TABS Take 1 tablet (5 mg total) by mouth 3 (three) times daily as needed. 90 tablet 5   carvedilol (COREG) 6.25 MG tablet Take 1 tablet (6.25 mg total) by mouth 2 (two) times daily with a meal. 60  tablet 0   gabapentin (NEURONTIN) 100 MG capsule Take 1 capsule (100 mg total) by mouth at bedtime. 30 capsule 5   irbesartan (AVAPRO) 75 MG tablet Take 1 tablet (75 mg total) by mouth daily. 30 tablet 0   Multiple Vitamins-Minerals (MULTIVITAMIN MEN) TABS Take 1 tablet by mouth daily with breakfast.     nicotine (NICODERM CQ - DOSED IN MG/24 HR) 7 mg/24hr patch Place 1 patch (7 mg total) onto the skin daily. (Patient not taking: Reported on 08/05/2022) 28 patch 0   traZODone (DESYREL) 50 MG tablet Take 1 tablet (50 mg total) by mouth at bedtime as needed for sleep. 30 tablet 0   No facility-administered medications prior to visit.    PAST MEDICAL HISTORY: Past Medical History:  Diagnosis Date   Hypertension    Lower back pain     PAST SURGICAL HISTORY: No past surgical history on file.  FAMILY HISTORY: Family History  Problem Relation Age of Onset   Cancer Mother    Diabetes Maternal Grandmother    Hypertension Other     SOCIAL HISTORY: Social History   Socioeconomic History   Marital status: Married    Spouse name: Not on file   Number of children: Not on file   Years of education: Not on file   Highest education level: Not on file  Occupational History   Not on file  Tobacco Use   Smoking status: Every Day    Packs/day: 1    Types: Cigarettes   Smokeless tobacco: Never  Vaping Use   Vaping Use: Never used  Substance and Sexual Activity   Alcohol use: Yes    Comment: some   Drug use: Yes    Types: Marijuana   Sexual activity: Not on file  Other Topics Concern   Not on file  Social History Narrative   Not on file   Social Determinants of Health   Financial Resource Strain: Not on file  Food Insecurity: No Food Insecurity (06/30/2022)   Hunger Vital Sign    Worried About Running Out of Food in the Last Year: Never true    Ran Out of Food in the Last Year: Never true  Transportation Needs: No Transportation Needs (06/30/2022)   PRAPARE - Armed forces logistics/support/administrative officer (Medical): No    Lack of Transportation (Non-Medical): No  Physical Activity: Not on file  Stress: Not on file  Social Connections: Not on file  Intimate Partner Violence: Not At Risk (06/30/2022)   Humiliation, Afraid, Rape, and Kick questionnaire    Fear of Current or Ex-Partner: No    Emotionally Abused: No    Physically Abused: No    Sexually Abused: No    PHYSICAL EXAM  There were no vitals filed for this visit. There is no height or weight on file to calculate BMI.  Generalized: Well developed, in no  acute distress  Neurological examination  Mentation: Alert oriented to time, place, history taking. Follows all commands speech and language fluent Cranial nerve II-XII: Pupils were equal round reactive to light. Extraocular movements were full, visual field were full on confrontational test. Facial sensation and strength were normal. Uvula tongue midline. Head turning and shoulder shrug  were normal and symmetric. Motor: The motor testing reveals 5 over 5 strength of all 4 extremities. Good symmetric motor tone is noted throughout.  Sensory: Sensory testing is intact to soft touch on all 4 extremities. No evidence of extinction is noted.  Coordination: Cerebellar testing reveals good finger-nose-finger and heel-to-shin bilaterally.  Gait and station: Gait is normal. Tandem gait is normal. Romberg is negative. No drift is seen.  Reflexes: Deep tendon reflexes are symmetric and normal bilaterally.   DIAGNOSTIC DATA (LABS, IMAGING, TESTING) - I reviewed patient records, labs, notes, testing and imaging myself where available.  Lab Results  Component Value Date   WBC 6.2 07/05/2022   HGB 13.9 07/05/2022   HCT 39.8 07/05/2022   MCV 88.8 07/05/2022   PLT 232 07/05/2022      Component Value Date/Time   NA 136 07/07/2022 0720   K 3.8 07/07/2022 0720   CL 101 07/07/2022 0720   CO2 27 07/07/2022 0720   GLUCOSE 245 (H) 07/07/2022 0720   BUN 21 (H)  07/07/2022 0720   CREATININE 1.65 (H) 07/07/2022 0720   CALCIUM 9.4 07/07/2022 0720   PROT 6.6 07/05/2022 0514   ALBUMIN 3.5 07/05/2022 0514   AST 24 07/05/2022 0514   ALT 24 07/05/2022 0514   ALKPHOS 72 07/05/2022 0514   BILITOT 0.5 07/05/2022 0514   GFRNONAA 52 (L) 07/07/2022 0720   GFRAA 80 (L) 12/20/2011 2332   Lab Results  Component Value Date   CHOL 148 07/01/2022   HDL 35 (L) 07/01/2022   LDLCALC 81 07/01/2022   TRIG 161 (H) 07/01/2022   CHOLHDL 4.2 07/01/2022   Lab Results  Component Value Date   HGBA1C 5.9 (H) 06/30/2022   No results found for: "VITAMINB12" No results found for: "TSH"  Butler Denmark, AGNP-C, DNP 08/09/2022, 5:39 AM [facility] [address], [place], [place] [address] [phone]

## 2022-08-10 ENCOUNTER — Ambulatory Visit: Payer: Commercial Managed Care - HMO | Admitting: Physical Therapy

## 2022-08-10 VITALS — BP 165/113 | HR 77

## 2022-08-10 DIAGNOSIS — M6281 Muscle weakness (generalized): Secondary | ICD-10-CM

## 2022-08-10 DIAGNOSIS — R2681 Unsteadiness on feet: Secondary | ICD-10-CM

## 2022-08-10 DIAGNOSIS — R278 Other lack of coordination: Secondary | ICD-10-CM

## 2022-08-10 DIAGNOSIS — R2689 Other abnormalities of gait and mobility: Secondary | ICD-10-CM

## 2022-08-10 NOTE — Therapy (Signed)
OUTPATIENT PHYSICAL THERAPY NEURO TREATMENT- ARRIVED NO CHARGE   Patient Name: Jonathan [name] MRN: [id number] DOB:[birth year], 47 y.o., male Today's Date: 08/10/2022   PCP: None on file REFERRING PROVIDER: Bary Leriche, PA-C  END OF SESSION:  PT End of Session - 08/10/22 0933     Visit Number 2   Arrived no charge   Number of Visits 13    Date for PT Re-Evaluation 09/28/22    Authorization Type [employer] (30 VL PT/OT hard max)    PT Start Time 0932    PT Stop Time 0939   Arrived no charge due to HTN   PT Time Calculation (min) 7 min    Equipment Utilized During Treatment Gait belt    Activity Tolerance Treatment limited secondary to medical complications (Comment)   HTN   Behavior During Therapy [facility] for tasks assessed/performed              Past Medical History:  Diagnosis Date   Chest pain 12/21/2011   Hypertension    Lower back pain    No past surgical history on file. Patient Active Problem List   Diagnosis Date Noted   Left spastic hemiparesis ([place]) 08/05/2022   HTN (hypertension) 07/08/2022   Acute renal failure superimposed on chronic kidney disease ([place]) 07/08/2022   Elevated lipids 07/08/2022   Right basal ganglia embolic stroke ([place]) 0000000   Acute ischemic stroke ([place]) 06/30/2022   Noncompliance with medication regimen 06/30/2022   Essential hypertension 11/03/2021   Tobacco abuse 12/21/2011   Alcoholism ([place]) 12/21/2011   VERTIGO NOS OR DIZZINESS 07/13/2006    ONSET DATE: 07/07/2022  REFERRING DIAG: I63.9 (ICD-10-CM) - Cerebral infarction, unspecified  THERAPY DIAG:  Muscle weakness (generalized)  Other lack of coordination  Other abnormalities of gait and mobility  Unsteadiness on feet  Rationale for Evaluation and Treatment: Rehabilitation  SUBJECTIVE:                                                                                                                                                                                              SUBJECTIVE STATEMENT: Patient reports doing well. No acute changes. HEP is "okay"   PERTINENT HISTORY: 47 y.o. male with of untreated hypertension and tobacco use who was admitted on 06/30/2022 with left arm and left leg weakness with that started the night before.  CTA head/neck was negative for LVO.  UDS was positive for cocaine.  MRI brain showed small acute infarct in white matter tracts in right basal ganglia and overlying frontal lobe.  Dr. 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  He recommended DAPT x 3 weeks followed by ASA alone as well as participation in sleep Study given risk of sleep apnea.  Patient was independent prior to admission and therapy evaluations revealed deficits due to LLE weakness with knee instability, LUE weakness with sensory deficits as well as decrease in safety awareness.  PAIN:  Are you having pain? No Pt reports frequent pain in LUE, distal to elbow   VITALS  Vitals:   08/10/22 0934 08/10/22 0937  BP: (!) 166/109 (!) 165/113  Pulse: 78 77   BP after scifit: 148/92 HR: 96  PRECAUTIONS: Fall  PATIENT GOALS: "Get back in shape and move around like I used to"  TODAY'S TREATMENT:          ARRIVED NO CHARGE Assessed pt's vitals (see above) and BP elevated above limits for PT. Pt reports he did not take his medication this morning. Encouraged pt to go home and take medication. Added extra appointment at end of POC to make up for missed appointment today.   PATIENT EDUCATION: Education details: Continue HEP  Person educated: Patient and Fiance Education method: Explanation Education comprehension: verbalized understanding and needs further education  HOME EXERCISE PROGRAM: Access Code: [email or web address] URL: [email or web address] Date: 08/05/2022 Prepared by: Estevan Ryder  Exercises - Side Stepping with Resistance at Thighs  - 1 x daily - 7 x weekly - 3 sets - 10 reps - Forward Monster Walk with  Resistance at [employer] and Counter Support  - 1 x daily - 7 x weekly - 3 sets - 10 reps - Backward Monster Walk with Resistance at [employer] and Counter Support  - 1 x daily - 7 x weekly - 3 sets - 10 reps - Standing 3-Way Leg Reach with Resistance at Ankles and Counter Support  - 1 x daily - 7 x weekly - 2 sets - 10 reps - Deadlift with Resistance  - 1 x daily - 7 x weekly - 2 sets - 10 reps  You Can Walk For A Certain Length Of Time Each Day                          Walk 5 minutes 2 times per day.             Increase 2  minutes every 3-4 days              Work up to 30 minutes (1-2 times per day).   GOALS: Goals reviewed with patient? Yes  SHORT TERM GOALS: Target date: 08/24/2022   Pt will perform initial HEP and walking program w/CGA from fiance for improved strength, balance, transfers and gait.  Baseline: Not established on eval  Goal status: INITIAL  2.  Patient will improve Berg balance scale score to >/= 50/56 to demonstrate improved balance and decreased risk for falls Baseline: 44/56 Goal status: REVISED  3.  Pt will improve 5 x STS to less than or equal to 18 seconds w/RUE support and good immediate standing balance and S* to demonstrate improved functional strength and transfer efficiency.   Baseline: 18.83s w/heavy lean to R side and poor immediate standing balance w/CGA Goal status: INITIAL  4.  Pt will improve normal TUG to less than or equal to 15 seconds w/LRAD and S* for improved functional mobility and decreased fall risk.  Baseline: 17.31s w/SPC and CGA Goal status: INITIAL  5.  Pt will improve gait velocity to at least 2.0 ft/s w/LRAD and S* for  improved gait efficiency and reduced recurrent fall risk   Baseline: 1.75 ft/s w/SPC and CGA Goal status: INITIAL  6.  Patient will negotiate at least 4 steps with no HR (per home set up) and no more than supervision Baseline: B HR and CGA Goal status: REVISED  LONG TERM GOALS: Target date: 09/21/2022   Pt will  be independent with final HEP and walking program for improved strength, balance, transfers and gait.  Baseline:  Goal status: INITIAL  2.  Berg goal Baseline: not necessary as patient scored 44/56 initially Goal status: INITIAL  3.  Pt will improve 5 x STS to less than or equal to 15 seconds w/RUE support mod I to demonstrate improved functional strength and transfer efficiency.   Baseline: 18.83s w/heavy lean to R side and poor immediate standing balance w/CGA Goal status: INITIAL  4.  Pt will improve normal TUG to less than or equal to 12 seconds w/LRAD mod I for improved functional mobility and decreased fall risk.  Baseline: 17.31s w/SPC and CGA Goal status: INITIAL  5.  Pt will improve FOTO to 69 for improved self-perceived functional ability and return to PLOF Baseline: 50 (3/13) Goal status: INITIAL  6.  Pt will improve gait velocity to at least 2.5 ft/s w/LRAD mod I for improved gait efficiency  Baseline: 1.75 ft/s w/SPC and CGA Goal status: INITIAL  ASSESSMENT:  CLINICAL IMPRESSION: Arrived no charge due to HTN.     OBJECTIVE IMPAIRMENTS: Abnormal gait, decreased activity tolerance, decreased balance, decreased coordination, decreased endurance, decreased knowledge of condition, decreased knowledge of use of DME, decreased mobility, difficulty walking, decreased strength, decreased safety awareness, impaired sensation, impaired UE functional use, improper body mechanics, and pain  ACTIVITY LIMITATIONS: carrying, lifting, bending, squatting, sleeping, stairs, transfers, bed mobility, dressing, reach over head, hygiene/grooming, locomotion level, and caring for others  PARTICIPATION LIMITATIONS: meal prep, cleaning, laundry, medication management, personal finances, interpersonal relationship, driving, shopping, community activity, occupation, and yard work  PERSONAL FACTORS: Education, Fitness, Past/current experiences, and 1 comorbidity: HTN  are also affecting  patient's functional outcome.   REHAB POTENTIAL: Good  CLINICAL DECISION MAKING: Evolving/moderate complexity  EVALUATION COMPLEXITY: Moderate  PLAN:  PT FREQUENCY:  2x/week for 4 weeks and 1x/week for 4 weeks  PT DURATION: 8 weeks  PLANNED INTERVENTIONS: Therapeutic exercises, Therapeutic activity, Neuromuscular re-education, Balance training, Gait training, Patient/Family education, Self Care, Joint mobilization, Stair training, Vestibular training, Canalith repositioning, Orthotic/Fit training, DME instructions, Aquatic Therapy, Dry Needling, Manual therapy, and Re-evaluation  PLAN FOR NEXT SESSION: Check BP. Review HEP PRN for LLE NMR/strength: Hip flex/add/abd, sit <>stands, weight shifting    Cruzita Lederer Rynell Ciotti, PT, DPT 08/10/2022, 9:44 AM

## 2022-08-17 ENCOUNTER — Encounter: Payer: Commercial Managed Care - HMO | Admitting: Speech Pathology

## 2022-08-17 ENCOUNTER — Ambulatory Visit: Payer: Self-pay | Attending: Physical Medicine and Rehabilitation | Admitting: Physical Therapy

## 2022-08-17 ENCOUNTER — Ambulatory Visit: Payer: Self-pay | Admitting: Occupational Therapy

## 2022-08-17 VITALS — BP 148/95 | HR 112

## 2022-08-17 DIAGNOSIS — I69354 Hemiplegia and hemiparesis following cerebral infarction affecting left non-dominant side: Secondary | ICD-10-CM

## 2022-08-17 DIAGNOSIS — R2689 Other abnormalities of gait and mobility: Secondary | ICD-10-CM

## 2022-08-17 DIAGNOSIS — R2681 Unsteadiness on feet: Secondary | ICD-10-CM | POA: Insufficient documentation

## 2022-08-17 DIAGNOSIS — M6281 Muscle weakness (generalized): Secondary | ICD-10-CM

## 2022-08-17 DIAGNOSIS — R278 Other lack of coordination: Secondary | ICD-10-CM | POA: Insufficient documentation

## 2022-08-17 DIAGNOSIS — R41842 Visuospatial deficit: Secondary | ICD-10-CM | POA: Insufficient documentation

## 2022-08-17 NOTE — Patient Instructions (Signed)
1. Grip Strengthening (Resistive Putty)   Squeeze putty using thumb and all fingers. Repeat _20___ times. Do __2__ sessions per day.   2. Roll putty into tube on table and pinch between first two fingers and thumb x 10 reps. Do 2 sessions per day    Coordination Activities  Perform the following activities for 10 minutes 1-2 times per day with left hand(s).  Rotate ball in fingertips (clockwise and counter-clockwise). Toss ball in air and catch with the same hand. Flip cards 1 at a time as fast as you can. Deal cards with your thumb (Hold deck in hand and push card off top with thumb). Rotate 1 card in hand (clockwise and counter-clockwise). Pick up coins one at a time until you get 5 in your hand, then move coins from palm to fingertips to stack one at a time. Screw together nuts and bolts, then unfasten.  Copyright  VHI. All rights reserved.

## 2022-08-17 NOTE — Therapy (Signed)
OUTPATIENT OCCUPATIONAL THERAPY NEURO TREATMENT  Patient Name: Jonathan [name] MRN: [id number] DOB:[birth year], 47 y.o., male Today's Date: 08/17/2022  PCP: None on file  REFERRING PROVIDER: Love, Ivan Anchors, PA-C   END OF SESSION:  OT End of Session - 08/17/22 0809     Visit Number 2    Number of Visits 16    Date for OT Re-Evaluation 10/21/22    Authorization Type [employer] [place] - 30 (OT and PT combined), no auth required    OT Start Time 0802    OT Stop Time 0845    OT Time Calculation (min) 43 min    Activity Tolerance Patient tolerated treatment well    Behavior During Therapy [facility] for tasks assessed/performed             Past Medical History:  Diagnosis Date   Chest pain 12/21/2011   Hypertension    Lower back pain    No past surgical history on file. Patient Active Problem List   Diagnosis Date Noted   Left spastic hemiparesis 08/05/2022   HTN (hypertension) 07/08/2022   Acute renal failure superimposed on chronic kidney disease 07/08/2022   Elevated lipids 07/08/2022   Right basal ganglia embolic stroke 0000000   Acute ischemic stroke 06/30/2022   Noncompliance with medication regimen 06/30/2022   Essential hypertension 11/03/2021   Tobacco abuse 12/21/2011   Alcoholism 12/21/2011   VERTIGO NOS OR DIZZINESS 07/13/2006    ONSET DATE:  06/29/2022   REFERRING DIAG: I63.9 (ICD-10-CM) - Cerebral infarction, unspecified   THERAPY DIAG:  Muscle weakness (generalized)  Other lack of coordination  Hemiplegia and hemiparesis following cerebral infarction affecting left non-dominant side  Rationale for Evaluation and Treatment: Rehabilitation  SUBJECTIVE:   SUBJECTIVE STATEMENT: I've been doing my dowel ex's - I don't have any questions with that  Pt accompanied by: significant other - Tia  PERTINENT HISTORY:  "48 y.o. male with of untreated hypertension and tobacco use who was admitted on 06/30/2022 with left arm and left leg weakness with that started  the night before.  CTA head/neck was negative for LVO.  UDS was positive for cocaine.  MRI brain showed small acute infarct in white matter tracts in right basal ganglia and overlying frontal lobe.  Dr. 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  He recommended DAPT x 3 weeks followed by ASA alone as well as participation in sleep Study given risk of sleep apnea.  Patient was independent prior to admission and therapy evaluations revealed deficits due to LLE weakness with knee instability, LUE weakness with sensory deficits as well as decrease in safety awareness."   PRECAUTIONS: Fall  WEIGHT BEARING RESTRICTIONS: No  PAIN:  Are you having pain? No and does have tingling in L hand. Low back pain 6/10 - O.T. not addressing d/t outside scope of practice  FALLS: Has patient fallen in last 6 months? Yes. Number of falls 2; fell last week  LIVING ENVIRONMENT: Lives with: lives with their family Lives in: House/apartment Stairs: Yes: Internal: 2 to get into the bed steps; none and External: 3 steps; none Has following equipment at home: Single point cane  PLOF: Independent; driving; was not working  PATIENT GOALS: return to PLOF  OBJECTIVE:   HAND DOMINANCE: Right  ADLs: Eating: requires assistance to cut food Grooming: requires assistance to put on deodorant, shaves with R UB Dressing: min A LB Dressing: min A Toileting: min A Bathing: min A  IADLs: Light housekeeping:  max A Meal Prep: min A Community mobility: dependent Medication management: dependent  MOBILITY STATUS: Needs Assist: CGA to Min A with SPC  ACTIVITY TOLERANCE: Activity tolerance: fair  FUNCTIONAL OUTCOME MEASURES: FOTO: 54 (moderate severity)    UPPER EXTREMITY ROM:     AROM Right (eval) Left (eval)  Shoulder flexion WNL WNL  Shoulder abduction WNL WNL  Elbow flexion WNL WNL  Elbow extension WNL WNL  Wrist flexion WNL WNL  Wrist extension WNL WNL  Wrist pronation WNL  WNL  Wrist supination WNL WNL   Digit Composite Flexion WNL WNL  Digit Composite Extension WNL WNL  Digit Opposition WNL bradykinesia  (Blank rows = not tested)  UPPER EXTREMITY MMT:     MMT Right (eval) Left (eval)  Shoulder flexion WNL BFL  Shoulder abduction WNL BFL  Elbow flexion WNL BFL  Elbow extension WNL BFL  (Blank rows = not tested)  HAND FUNCTION: Grip strength: Right: 109.1 lbs; Left: 16.7 lbs  COORDINATION: 9 Hole Peg test: Right: 31 sec; Left: 50 sec  SENSATION: Paresthesias and tingling in L hand  EDEMA: mild observed at eval; edema reported  MUSCLE TONE: LUE: Within functional limits  COGNITION: Overall cognitive status: Within functional limits for tasks assessed  VISION: Subjective report: blurry near vision; diplopia reported with near vision Baseline vision: No visual deficits Visual history:  N/A  VISION ASSESSMENT: WFL  PERCEPTION: WFL  PRAXIS: WFL  OBSERVATIONS: Pt appears well-kept. LUE AROM WFL with increased time due to bradykinesia.   TODAY'S TREATMENT:                                                                                                                               - BP 154/107, HR = 102.  Reviewed warning signs/symptoms of stroke. Also recommended pt make another appointment with PCP (as pt missed last one) to go over medications, and if necessary adjust BP meds due to continued HTN and increased risk for another stroke.  Pt issued putty and coordination HEP - see pt instructions for details. Pt issued red resistance putty for home. Pt instructed in care of putty  PATIENT EDUCATION: Education details: Putty and coordination HEP  Person educated: Patient and significant other Education method: Explanation, Demonstration, and Handouts Education comprehension: verbalized understanding, returned demonstration, and needs further education  HOME EXERCISE PROGRAM: 3/13: dowel HEP 08/17/22: Putty and coordination HEP    GOALS:  SHORT TERM GOALS: Target date: 08/24/2022   Patient will be independent with initial LUE HEP. Baseline: Goal status: MET  2.  Patient will demonstrate at least 25 lbs L grip strength as needed to open jars and other containers. Baseline: Left: 16.7 lbs Goal status: IN PROGRESS  3.  Patient will be independent with diplopia HEP. Baseline:  Goal status: INITIAL  LONG TERM GOALS: Target date: 10/21/2022  Patient will demonstrate updated LUE HEP with 25% verbal cues or less for proper execution. Baseline:  Goal status: INITIAL  2.  Patient will complete nine-hole peg with use of L in 35 seconds or less. Baseline: Left: 50 sec Goal status: IN PROGRESS  3.  Pt will complete d/c FOTO Baseline:  Goal status: INITIAL  4.  Patient will demonstrate at least 35 lbs L grip strength as needed to open jars and other containers. Baseline: Left: 16.7 lbs Goal status: INITIAL  5.  Pt will report no more than a little difficulty placing a can of soup (1lb) on a shelf at shoulder height using LUE.  Baseline: much difficulty Goal status: INITIAL  ASSESSMENT:  CLINICAL IMPRESSION: Pt returns today for implementation of putty and coordination HEP. Pt tolerating well.    PERFORMANCE DEFICITS: in functional skills including ADLs, IADLs, coordination, sensation, ROM, strength, Fine motor control, Gross motor control, mobility, endurance, vision, and UE functional use.   IMPAIRMENTS: are limiting patient from ADLs, IADLs, work, and social participation.   CO-MORBIDITIES: may have co-morbidities  that affects occupational performance. Patient will benefit from skilled OT to address above impairments and improve overall function.  MODIFICATION OR ASSISTANCE TO COMPLETE EVALUATION: No modification of tasks or assist necessary to complete an evaluation.  OT OCCUPATIONAL PROFILE AND HISTORY: Problem focused assessment: Including review of records relating to presenting  problem.  CLINICAL DECISION MAKING: LOW - limited treatment options, no task modification necessary  REHAB POTENTIAL: Good  EVALUATION COMPLEXITY: Low    PLAN:  OT FREQUENCY: 2x/week for 4 weeks and 1x/week for 4 weeks   OT DURATION:  12 sessions+eval  PLANNED INTERVENTIONS: self care/ADL training, therapeutic exercise, therapeutic activity, neuromuscular re-education, manual therapy, passive range of motion, functional mobility training, splinting, electrical stimulation, ultrasound, paraffin, fluidotherapy, moist heat, cryotherapy, contrast bath, patient/family education, visual/perceptual remediation/compensation, and Re-evaluation  RECOMMENDED OTHER SERVICES: none at this time  CONSULTED AND AGREED WITH PLAN OF CARE: Patient and significant other  PLAN FOR NEXT SESSION: ? Diplopia HEP if pt still needs, review previously issued HEP's prn, continue grip strength Lt hand w/ gripper activity   Hans Eden, OT 08/17/2022, 8:10 AM

## 2022-08-17 NOTE — Therapy (Signed)
OUTPATIENT PHYSICAL THERAPY NEURO TREATMENT   Patient Name: Jonathan [name] MRN: [id number] DOB:[birth year], 47 y.o., male Today's Date: 08/17/2022   PCP: None on file REFERRING PROVIDER: Bary Leriche, PA-C  END OF SESSION:  PT End of Session - 08/17/22 0847     Visit Number 3    Number of Visits 13    Date for PT Re-Evaluation 09/28/22    Authorization Type [employer] (30 VL PT/OT hard max)    PT Start Time 0846    PT Stop Time 0926    PT Time Calculation (min) 40 min    Equipment Utilized During Treatment Gait belt    Activity Tolerance Patient tolerated treatment well    Behavior During Therapy WFL for tasks assessed/performed               Past Medical History:  Diagnosis Date   Chest pain 12/21/2011   Hypertension    Lower back pain    No past surgical history on file. Patient Active Problem List   Diagnosis Date Noted   Left spastic hemiparesis 08/05/2022   HTN (hypertension) 07/08/2022   Acute renal failure superimposed on chronic kidney disease 07/08/2022   Elevated lipids 07/08/2022   Right basal ganglia embolic stroke 0000000   Acute ischemic stroke 06/30/2022   Noncompliance with medication regimen 06/30/2022   Essential hypertension 11/03/2021   Tobacco abuse 12/21/2011   Alcoholism 12/21/2011   VERTIGO NOS OR DIZZINESS 07/13/2006    ONSET DATE: 07/07/2022  REFERRING DIAG: I63.9 (ICD-10-CM) - Cerebral infarction, unspecified  THERAPY DIAG:  Hemiplegia and hemiparesis following cerebral infarction affecting left non-dominant side  Muscle weakness (generalized)  Other abnormalities of gait and mobility  Rationale for Evaluation and Treatment: Rehabilitation  SUBJECTIVE:                                                                                                                                                                                             SUBJECTIVE STATEMENT: Pt reports doing well. Did take his BP meds this morning.  Has some soreness of his forearms, states he was doing curls with dumbbells. No falls    PERTINENT HISTORY: 47 y.o. male with of untreated hypertension and tobacco use who was admitted on 06/30/2022 with left arm and left leg weakness with that started the night before.  CTA head/neck was negative for LVO.  UDS was positive for cocaine.  MRI brain showed small acute infarct in white matter tracts in right basal ganglia and overlying frontal lobe.  Dr. 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  He recommended DAPT  x 3 weeks followed by ASA alone as well as participation in sleep Study given risk of sleep apnea.  Patient was independent prior to admission and therapy evaluations revealed deficits due to LLE weakness with knee instability, LUE weakness with sensory deficits as well as decrease in safety awareness.  PAIN:  Are you having pain? No Pt reports frequent pain in LUE, distal to elbow   VITALS  Vitals:   08/17/22 0848 08/17/22 0903  BP: (!) 142/101 (!) 148/95  Pulse: 99 (!) 112    BP after scifit: 148/92 HR: 96  PRECAUTIONS: Fall  PATIENT GOALS: "Get back in shape and move around like I used to"  TODAY'S TREATMENT:          Ther Act  Assessed BP (see above) and pt's diastolic elevated by narrowly within limits for therapy. Closely monitored BP throughout remainder of session and BP did decrease w/activity.   Ther Ex  SciFit multi-peaks level 5 for 8 minutes using BUE/BLEs for neural priming for reciprocal movement, dynamic cardiovascular warmup and global strength. Cued pt to maintain steps/min >90 throughout, which pt did well. RPE of 7-8/10 and BP of  148/95 mmHg following activity   NMR  In // bars for improved lateral weight shifting, midline orientation and single leg stability:  Standing on rocker in L/R direction without UE support x3 minutes using mirror for visual biofeedback. Pt has R weight shift tendency but was able to maintain equal  shift to L side w/visual cues. CGA for safety  Mini squats x10 on board for added challenge. CGA-min A for occasional posterior LOB.  Standing on rocker in A/P direction x2 minutes w/intermittent UE support and using mirror for visual biofeedback on body position. CGA-min A for posterior LOB, min cues to "lightly press gas pedal" to encourage anterior weight shift.  Alt cone taps on board, x10 per side w/intermittent UE support and min A for steadying assist. Increased difficulty tapping w/RLE but if cued to slow down, pt performed well.  Fwd 4" hurdle navigation without UE support using step-to pattern, x2 each direction. Min cues to reduce circumduction compensation on LLE throughout, but pt able to clear hurdles well.  Lateral 4" hurdle navigation without UE support, x2 each direction. Pt had increased difficulty w/this and compensated w/rotation on R side to recruit adductors.  Seated march overs using 10# KB x10 per side, for improved hip flex/abd/add strength and core stability. Incr difficulty w/LLE but pt able to perform slowly. Encouraged pt to work on this at home.   PATIENT EDUCATION: Education details: Continue HEP  Person educated: Patient and Fiance Education method: Explanation Education comprehension: verbalized understanding and needs further education  HOME EXERCISE PROGRAM: Access Code: [email or web address] URL: [email or web address] Date: 08/05/2022 Prepared by: Estevan Ryder  Exercises - Side Stepping with Resistance at Thighs  - 1 x daily - 7 x weekly - 3 sets - 10 reps - Forward Monster Walk with Resistance at [employer] and Counter Support  - 1 x daily - 7 x weekly - 3 sets - 10 reps - Backward Monster Walk with Resistance at [employer] and Counter Support  - 1 x daily - 7 x weekly - 3 sets - 10 reps - Standing 3-Way Leg Reach with Resistance at Ankles and Counter Support  - 1 x daily - 7 x weekly - 2 sets - 10 reps - Deadlift with Resistance  - 1 x daily - 7 x weekly - 2  sets - 10 reps  You  Can Walk For A Certain Length Of Time Each Day                          Walk 5 minutes 2 times per day.             Increase 2  minutes every 3-4 days              Work up to 30 minutes (1-2 times per day).   GOALS: Goals reviewed with patient? Yes  SHORT TERM GOALS: Target date: 08/24/2022   Pt will perform initial HEP and walking program w/CGA from fiance for improved strength, balance, transfers and gait.  Baseline: Not established on eval  Goal status: INITIAL  2.  Patient will improve Berg balance scale score to >/= 50/56 to demonstrate improved balance and decreased risk for falls Baseline: 44/56 Goal status: REVISED  3.  Pt will improve 5 x STS to less than or equal to 18 seconds w/RUE support and good immediate standing balance and S* to demonstrate improved functional strength and transfer efficiency.   Baseline: 18.83s w/heavy lean to R side and poor immediate standing balance w/CGA Goal status: INITIAL  4.  Pt will improve normal TUG to less than or equal to 15 seconds w/LRAD and S* for improved functional mobility and decreased fall risk.  Baseline: 17.31s w/SPC and CGA Goal status: INITIAL  5.  Pt will improve gait velocity to at least 2.0 ft/s w/LRAD and S* for improved gait efficiency and reduced recurrent fall risk   Baseline: 1.75 ft/s w/SPC and CGA Goal status: INITIAL  6.  Patient will negotiate at least 4 steps with no HR (per home set up) and no more than supervision Baseline: B HR and CGA Goal status: REVISED  LONG TERM GOALS: Target date: 09/21/2022   Pt will be independent with final HEP and walking program for improved strength, balance, transfers and gait.  Baseline:  Goal status: INITIAL  2.  Berg goal Baseline: not necessary as patient scored 44/56 initially Goal status: INITIAL  3.  Pt will improve 5 x STS to less than or equal to 15 seconds w/RUE support mod I to demonstrate improved functional strength and transfer  efficiency.   Baseline: 18.83s w/heavy lean to R side and poor immediate standing balance w/CGA Goal status: INITIAL  4.  Pt will improve normal TUG to less than or equal to 12 seconds w/LRAD mod I for improved functional mobility and decreased fall risk.  Baseline: 17.31s w/SPC and CGA Goal status: INITIAL  5.  Pt will improve FOTO to 69 for improved self-perceived functional ability and return to PLOF Baseline: 50 (3/13) Goal status: INITIAL  6.  Pt will improve gait velocity to at least 2.5 ft/s w/LRAD mod I for improved gait efficiency  Baseline: 1.75 ft/s w/SPC and CGA Goal status: INITIAL  ASSESSMENT:  CLINICAL IMPRESSION: Emphasis of skilled PT session on endurance, lateral weight shifting, single leg stability and hip flexor strength. Pt's BP better managed this date but continues to be elevated, so closely monitored during session. Pt continues to have R weight shift preference but responded well to use of visual cues for body position. Pt challenged by hurdle navigation and seated march overs due to L hip flexor/abductor weakness but performed well when moving slowly. Encouraged pt to work on this at home using his workout equipment, pt verbalized understanding. Continue POC.     OBJECTIVE IMPAIRMENTS: Abnormal gait, decreased activity  tolerance, decreased balance, decreased coordination, decreased endurance, decreased knowledge of condition, decreased knowledge of use of DME, decreased mobility, difficulty walking, decreased strength, decreased safety awareness, impaired sensation, impaired UE functional use, improper body mechanics, and pain  ACTIVITY LIMITATIONS: carrying, lifting, bending, squatting, sleeping, stairs, transfers, bed mobility, dressing, reach over head, hygiene/grooming, locomotion level, and caring for others  PARTICIPATION LIMITATIONS: meal prep, cleaning, laundry, medication management, personal finances, interpersonal relationship, driving, shopping,  community activity, occupation, and yard work  PERSONAL FACTORS: Education, Fitness, Past/current experiences, and 1 comorbidity: HTN  are also affecting patient's functional outcome.   REHAB POTENTIAL: Good  CLINICAL DECISION MAKING: Evolving/moderate complexity  EVALUATION COMPLEXITY: Moderate  PLAN:  PT FREQUENCY:  2x/week for 4 weeks and 1x/week for 4 weeks  PT DURATION: 8 weeks  PLANNED INTERVENTIONS: Therapeutic exercises, Therapeutic activity, Neuromuscular re-education, Balance training, Gait training, Patient/Family education, Self Care, Joint mobilization, Stair training, Vestibular training, Canalith repositioning, Orthotic/Fit training, DME instructions, Aquatic Therapy, Dry Needling, Manual therapy, and Re-evaluation  PLAN FOR NEXT SESSION: Check BP. Review HEP PRN for LLE NMR/strength: Hip flex/add/abd, sit <>stands, weight shifting, sit <>stands w/shift to L side, staggered DL    Mauro Arps E Rickayla Wieland, PT, DPT 08/17/2022, 9:29 AM

## 2022-08-19 ENCOUNTER — Ambulatory Visit: Payer: Self-pay

## 2022-08-24 ENCOUNTER — Ambulatory Visit: Payer: Self-pay | Admitting: Rehabilitative and Restorative Service Providers"

## 2022-08-24 ENCOUNTER — Encounter: Payer: Commercial Managed Care - HMO | Admitting: Speech Pathology

## 2022-08-24 ENCOUNTER — Ambulatory Visit: Payer: Self-pay | Admitting: Physical Therapy

## 2022-08-26 ENCOUNTER — Other Ambulatory Visit: Payer: Self-pay

## 2022-08-26 ENCOUNTER — Ambulatory Visit: Payer: Self-pay | Admitting: Physical Therapy

## 2022-08-26 ENCOUNTER — Ambulatory Visit: Payer: Self-pay | Admitting: Occupational Therapy

## 2022-08-26 ENCOUNTER — Telehealth: Payer: Self-pay | Admitting: *Deleted

## 2022-08-26 VITALS — BP 163/113 | HR 92

## 2022-08-26 DIAGNOSIS — I69354 Hemiplegia and hemiparesis following cerebral infarction affecting left non-dominant side: Secondary | ICD-10-CM

## 2022-08-26 DIAGNOSIS — R2681 Unsteadiness on feet: Secondary | ICD-10-CM

## 2022-08-26 DIAGNOSIS — R2689 Other abnormalities of gait and mobility: Secondary | ICD-10-CM

## 2022-08-26 DIAGNOSIS — M6281 Muscle weakness (generalized): Secondary | ICD-10-CM

## 2022-08-26 NOTE — Telephone Encounter (Signed)
Left message on VM (per DPR) to encourage him to get another appointment with PCP or see treatment with urgent care for his BP. I reiterated he needed to be seen by primary care to manage his elevated BP or seek treatment at Urgent Care.Marland Kitchen Marland Kitchen

## 2022-08-26 NOTE — Therapy (Signed)
Pt was not see due to high BP outside of therapeutic range. See PT note for further details. Will see pt once medically appropriate.

## 2022-08-26 NOTE — Telephone Encounter (Signed)
-----   Message from Erick Colace, MD sent at 08/26/2022 11:11 AM EDT ----- He had a PCP appt set up last week, he will need to contact that office to reschedule or go to Urgent care  Neela Zecca, Please call to inform ----- Message ----- From: Plaster, Jill Alexanders, PT Sent: 08/26/2022  10:56 AM EDT To: Erick Colace, MD  Dr. Wynn Banker,   I have been trying to see this patient in PT for a month, but due to elevated BP, he has been unable to participate. He has been taking his medication as prescribed and I have provided him with information on finding a PCP, but in the meantime, can you adjust his medications to assist with BP management?   Thanks,  Ernestene Kiel, PT, DPT

## 2022-08-26 NOTE — Therapy (Signed)
OUTPATIENT PHYSICAL THERAPY NEURO TREATMENT- ARRIVED NO CHARGE   Patient Name: Jonathan [name] MRN: [id number] DOB:[birth year], 47 y.o., male Today's Date: 08/26/2022   PCP: None on file REFERRING PROVIDER: Jacquelynn Cree, PA-C  END OF SESSION:  PT End of Session - 08/26/22 1018     Visit Number 3    Number of Visits 13    Date for PT Re-Evaluation 09/28/22    Authorization Type [employer] (30 VL PT/OT hard max)    PT Start Time 1017    PT Stop Time 1033   Arrived no charge   PT Time Calculation (min) 16 min    Equipment Utilized During Treatment --    Activity Tolerance Treatment limited secondary to medical complications (Comment)   HTN   Behavior During Therapy [facility] for tasks assessed/performed                Past Medical History:  Diagnosis Date   Chest pain 12/21/2011   Hypertension    Lower back pain    No past surgical history on file. Patient Active Problem List   Diagnosis Date Noted   Left spastic hemiparesis 08/05/2022   HTN (hypertension) 07/08/2022   Acute renal failure superimposed on chronic kidney disease 07/08/2022   Elevated lipids 07/08/2022   Right basal ganglia embolic stroke 07/04/2022   Acute ischemic stroke 06/30/2022   Noncompliance with medication regimen 06/30/2022   Essential hypertension 11/03/2021   Tobacco abuse 12/21/2011   Alcoholism 12/21/2011   VERTIGO NOS OR DIZZINESS 07/13/2006    ONSET DATE: 07/07/2022  REFERRING DIAG: I63.9 (ICD-10-CM) - Cerebral infarction, unspecified  THERAPY DIAG:  Muscle weakness (generalized)  Hemiplegia and hemiparesis following cerebral infarction affecting left non-dominant side  Other abnormalities of gait and mobility  Unsteadiness on feet  Rationale for Evaluation and Treatment: Rehabilitation  SUBJECTIVE:                                                                                                                                                                                              SUBJECTIVE STATEMENT: Pt reports doing well. Mowed his grass this week w/push mower. Did take his BP medication this morning, but was out for a few days.    PERTINENT HISTORY: 47 y.o. male with of untreated hypertension and tobacco use who was admitted on 06/30/2022 with left arm and left leg weakness with that started the night before.  CTA head/neck was negative for LVO.  UDS was positive for cocaine.  MRI brain showed small acute infarct in white matter tracts in right basal ganglia and overlying frontal lobe.  Dr. Pearlean Brownie felt  the stroke was due to small vessel disease and cocaine use.  He recommended DAPT x 3 weeks followed by ASA alone as well as participation in sleep Study given risk of sleep apnea.  Patient was independent prior to admission and therapy evaluations revealed deficits due to LLE weakness with knee instability, LUE weakness with sensory deficits as well as decrease in safety awareness.  PAIN:  Are you having pain? No Pt reports frequent pain in LUE, distal to elbow   VITALS  Vitals:   08/26/22 1019  BP: (!) 163/113  Pulse: 92     BP after scifit: 148/92 HR: 96  PRECAUTIONS: Fall  PATIENT GOALS: "Get back in shape and move around like I used to"  TODAY'S TREATMENT:          ARRIVED NO CHARGE Assessed BP (see above) and pt's diastolic too elevated to participate in therapy. Pt reports he does not have a PCP to contact about medication and per Dr. Jodean Lima notes in medication, he will not be refilling the medication once his amount runs out. Provided pt and fiance w/resources sheet to find a PCP and strongly encouraged them to call today to get in with a PCP for better medication management. Pt verbalized understanding.    PATIENT EDUCATION: Education details: See above Person educated: Patient and Fiance Education method: Explanation, Demonstration, and Handouts Education comprehension: verbalized understanding and needs further education  HOME  EXERCISE PROGRAM: Access Code: [email or web address] URL: [email or web address] Date: 08/05/2022 Prepared by: Merry Lofty  Exercises - Side Stepping with Resistance at Thighs  - 1 x daily - 7 x weekly - 3 sets - 10 reps - Forward Monster Walk with Resistance at [employer] and Counter Support  - 1 x daily - 7 x weekly - 3 sets - 10 reps - Backward Monster Walk with Resistance at [employer] and Counter Support  - 1 x daily - 7 x weekly - 3 sets - 10 reps - Standing 3-Way Leg Reach with Resistance at Ankles and Counter Support  - 1 x daily - 7 x weekly - 2 sets - 10 reps - Deadlift with Resistance  - 1 x daily - 7 x weekly - 2 sets - 10 reps  You Can Walk For A Certain Length Of Time Each Day                          Walk 5 minutes 2 times per day.             Increase 2  minutes every 3-4 days              Work up to 30 minutes (1-2 times per day).   GOALS: Goals reviewed with patient? Yes  SHORT TERM GOALS: Target date: 08/24/2022   Pt will perform initial HEP and walking program w/CGA from fiance for improved strength, balance, transfers and gait.  Baseline: Not established on eval  Goal status: INITIAL  2.  Patient will improve Berg balance scale score to >/= 50/56 to demonstrate improved balance and decreased risk for falls Baseline: 44/56 Goal status: REVISED  3.  Pt will improve 5 x STS to less than or equal to 18 seconds w/RUE support and good immediate standing balance and S* to demonstrate improved functional strength and transfer efficiency.   Baseline: 18.83s w/heavy lean to R side and poor immediate standing balance w/CGA Goal status: INITIAL  4.  Pt will improve normal TUG to less than  or equal to 15 seconds w/LRAD and S* for improved functional mobility and decreased fall risk.  Baseline: 17.31s w/SPC and CGA Goal status: INITIAL  5.  Pt will improve gait velocity to at least 2.0 ft/s w/LRAD and S* for improved gait efficiency and reduced recurrent fall risk    Baseline: 1.75 ft/s w/SPC and CGA Goal status: INITIAL  6.  Patient will negotiate at least 4 steps with no HR (per home set up) and no more than supervision Baseline: B HR and CGA Goal status: REVISED  LONG TERM GOALS: Target date: 09/21/2022   Pt will be independent with final HEP and walking program for improved strength, balance, transfers and gait.  Baseline:  Goal status: INITIAL  2.  Berg goal Baseline: not necessary as patient scored 44/56 initially Goal status: INITIAL  3.  Pt will improve 5 x STS to less than or equal to 15 seconds w/RUE support mod I to demonstrate improved functional strength and transfer efficiency.   Baseline: 18.83s w/heavy lean to R side and poor immediate standing balance w/CGA Goal status: INITIAL  4.  Pt will improve normal TUG to less than or equal to 12 seconds w/LRAD mod I for improved functional mobility and decreased fall risk.  Baseline: 17.31s w/SPC and CGA Goal status: INITIAL  5.  Pt will improve FOTO to 69 for improved self-perceived functional ability and return to PLOF Baseline: 50 (3/13) Goal status: INITIAL  6.  Pt will improve gait velocity to at least 2.5 ft/s w/LRAD mod I for improved gait efficiency  Baseline: 1.75 ft/s w/SPC and CGA Goal status: INITIAL  ASSESSMENT:  CLINICAL IMPRESSION: Arrived no charge due to HTN.     OBJECTIVE IMPAIRMENTS: Abnormal gait, decreased activity tolerance, decreased balance, decreased coordination, decreased endurance, decreased knowledge of condition, decreased knowledge of use of DME, decreased mobility, difficulty walking, decreased strength, decreased safety awareness, impaired sensation, impaired UE functional use, improper body mechanics, and pain  ACTIVITY LIMITATIONS: carrying, lifting, bending, squatting, sleeping, stairs, transfers, bed mobility, dressing, reach over head, hygiene/grooming, locomotion level, and caring for others  PARTICIPATION LIMITATIONS: meal prep,  cleaning, laundry, medication management, personal finances, interpersonal relationship, driving, shopping, community activity, occupation, and yard work  PERSONAL FACTORS: Education, Fitness, Past/current experiences, and 1 comorbidity: HTN  are also affecting patient's functional outcome.   REHAB POTENTIAL: Good  CLINICAL DECISION MAKING: Evolving/moderate complexity  EVALUATION COMPLEXITY: Moderate  PLAN:  PT FREQUENCY:  2x/week for 4 weeks and 1x/week for 4 weeks  PT DURATION: 8 weeks  PLANNED INTERVENTIONS: Therapeutic exercises, Therapeutic activity, Neuromuscular re-education, Balance training, Gait training, Patient/Family education, Self Care, Joint mobilization, Stair training, Vestibular training, Canalith repositioning, Orthotic/Fit training, DME instructions, Aquatic Therapy, Dry Needling, Manual therapy, and Re-evaluation  PLAN FOR NEXT SESSION: Check BP. Review HEP PRN for LLE NMR/strength: Hip flex/add/abd, sit <>stands, weight shifting, sit <>stands w/shift to L side, staggered DL    Caron Tardif E [place], PT, DPT 08/26/2022, 10:51 AM

## 2022-08-27 ENCOUNTER — Other Ambulatory Visit (HOSPITAL_COMMUNITY): Payer: Self-pay

## 2022-08-31 ENCOUNTER — Ambulatory Visit: Payer: Self-pay | Admitting: Physical Therapy

## 2022-08-31 ENCOUNTER — Other Ambulatory Visit (HOSPITAL_COMMUNITY): Payer: Self-pay

## 2022-08-31 ENCOUNTER — Ambulatory Visit: Payer: Self-pay

## 2022-08-31 ENCOUNTER — Encounter: Payer: Commercial Managed Care - HMO | Admitting: Speech Pathology

## 2022-08-31 VITALS — BP 153/98 | HR 85

## 2022-08-31 DIAGNOSIS — M6281 Muscle weakness (generalized): Secondary | ICD-10-CM

## 2022-08-31 DIAGNOSIS — I69354 Hemiplegia and hemiparesis following cerebral infarction affecting left non-dominant side: Secondary | ICD-10-CM

## 2022-08-31 DIAGNOSIS — R278 Other lack of coordination: Secondary | ICD-10-CM

## 2022-08-31 DIAGNOSIS — R2689 Other abnormalities of gait and mobility: Secondary | ICD-10-CM

## 2022-08-31 DIAGNOSIS — R41842 Visuospatial deficit: Secondary | ICD-10-CM

## 2022-08-31 NOTE — Therapy (Signed)
OUTPATIENT OCCUPATIONAL THERAPY NEURO TREATMENT  Patient Name: Jonathan [name] MRN: [id number] DOB:1976/04/24, 47 y.o., male Today's Date: 08/31/2022  PCP: None on file  REFERRING PROVIDER: Love, Evlyn Kanner, PA-C   END OF SESSION:  OT End of Session - 08/31/22 1021     Visit Number 3    Number of Visits 16    Date for OT Re-Evaluation 10/21/22    Authorization Type [employer] [place] - 30 (OT and PT combined), no auth required    OT Start Time 1016    OT Stop Time 1101    OT Time Calculation (min) 45 min    Activity Tolerance Patient tolerated treatment well    Behavior During Therapy [facility] for tasks assessed/performed             Past Medical History:  Diagnosis Date   Chest pain 12/21/2011   Hypertension    Lower back pain    History reviewed. No pertinent surgical history. Patient Active Problem List   Diagnosis Date Noted   Left spastic hemiparesis 08/05/2022   HTN (hypertension) 07/08/2022   Acute renal failure superimposed on chronic kidney disease 07/08/2022   Elevated lipids 07/08/2022   Right basal ganglia embolic stroke 07/04/2022   Acute ischemic stroke 06/30/2022   Noncompliance with medication regimen 06/30/2022   Essential hypertension 11/03/2021   Tobacco abuse 12/21/2011   Alcoholism 12/21/2011   VERTIGO NOS OR DIZZINESS 07/13/2006    ONSET DATE:  06/29/2022   REFERRING DIAG: I63.9 (ICD-10-CM) - Cerebral infarction, unspecified   THERAPY DIAG:  Muscle weakness (generalized)  Hemiplegia and hemiparesis following cerebral infarction affecting left non-dominant side  Other lack of coordination  Visuospatial deficit  Rationale for Evaluation and Treatment: Rehabilitation  SUBJECTIVE:   SUBJECTIVE STATEMENT: Pt reports he has not been consistently doing HEP. He also notes awareness of left eye fatigue once it was addressed.  Pt accompanied by: significant other - Tia  PERTINENT HISTORY:  "47 y.o. male with of untreated hypertension and tobacco  use who was admitted on 06/30/2022 with left arm and left leg weakness with that started the night before.  CTA head/neck was negative for LVO.  UDS was positive for cocaine.  MRI brain showed small acute infarct in white matter tracts in right basal ganglia and overlying frontal lobe.  Dr. 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  He recommended DAPT x 3 weeks followed by ASA alone as well as participation in sleep Study given risk of sleep apnea.  Patient was independent prior to admission and therapy evaluations revealed deficits due to LLE weakness with knee instability, LUE weakness with sensory deficits as well as decrease in safety awareness."   PRECAUTIONS: Fall  WEIGHT BEARING RESTRICTIONS: No  PAIN:  Are you having pain? No and does have tingling in L hand. Low back pain 6/10 - O.T. not addressing d/t outside scope of practice  FALLS: Has patient fallen in last 6 months? Yes. Number of falls 2; fell last week  LIVING ENVIRONMENT: Lives with: lives with their family Lives in: House/apartment Stairs: Yes: Internal: 2 to get into the bed steps; none and External: 3 steps; none Has following equipment at home: Single point cane  PLOF: Independent; driving; was not working  PATIENT GOALS: return to PLOF  OBJECTIVE:   HAND DOMINANCE: Right  ADLs: Eating: requires assistance to cut food Grooming: requires assistance to put on deodorant, shaves with R UB Dressing: min A LB Dressing: min A  Toileting: min A Bathing: min A  IADLs: Light housekeeping: max A Meal Prep: min A Community mobility: dependent Medication management: dependent  MOBILITY STATUS: Needs Assist: CGA to Min A with SPC  ACTIVITY TOLERANCE: Activity tolerance: fair  FUNCTIONAL OUTCOME MEASURES: FOTO: 54 (moderate severity)    UPPER EXTREMITY ROM:     AROM Right (eval) Left (eval)  Shoulder flexion WNL WNL  Shoulder abduction WNL WNL  Elbow flexion WNL WNL  Elbow  extension WNL WNL  Wrist flexion WNL WNL  Wrist extension WNL WNL  Wrist pronation WNL WNL  Wrist supination WNL WNL   Digit Composite Flexion WNL WNL  Digit Composite Extension WNL WNL  Digit Opposition WNL bradykinesia  (Blank rows = not tested)  UPPER EXTREMITY MMT:     MMT Right (eval) Left (eval)  Shoulder flexion WNL BFL  Shoulder abduction WNL BFL  Elbow flexion WNL BFL  Elbow extension WNL BFL  (Blank rows = not tested)  HAND FUNCTION: Grip strength: Right: 109.1 lbs; Left: 16.7 lbs  COORDINATION: 9 Hole Peg test: Right: 31 sec; Left: 50 sec  SENSATION: Paresthesias and tingling in L hand  EDEMA: mild observed at eval; edema reported  MUSCLE TONE: LUE: Within functional limits  COGNITION: Overall cognitive status: Within functional limits for tasks assessed  VISION: Subjective report: blurry near vision; diplopia reported with near vision Baseline vision: No visual deficits Visual history:  N/A  VISION ASSESSMENT: WFL  PERCEPTION: WFL  PRAXIS: WFL  OBSERVATIONS: Pt appears well-kept. LUE AROM WFL with increased time due to bradykinesia.   TODAY'S TREATMENT:                                                                                                                               - BP 159/84  Reviewed recommendation for pt to make another appointment with PCP (as pt missed last one) to go over medications, and if necessary adjust BP meds due to continued HTN and increased risk for another stroke. Pt reports appt scheduled with Lorne Skeens (spelling?) at May 8 at 10am. Education provided as well to obtain BP cuff and to check [email or web address] as needed. Convergence/divergence exercise issued due to difficulty with reading materials and convergence. Pt reports mild diplopia towards left side as well. Initiated diplopia HEP. Please see patient instructions for detail. Verbal cues provided to perform HEP correctly. Education provided to schedule an  appt for optometrist as well based on pt report of items being more difficult to read closely. Education also provided on when to give eyes rest. Verbally reviewed putty strengthening HEP and return demo of coordination HEP. Pt unable to recall coordination activities. Pt performed HEP again this date. Please see 4/8 pt instructions for detail of activities. Verbal and visual cues required for correct form.  PATIENT EDUCATION: Education details: BP education and PCP, diplopia and vision Person educated: Patient and significant other Education method: Explanation, Demonstration, and Handouts Education comprehension: verbalized understanding,  returned demonstration, and needs further education  HOME EXERCISE PROGRAM: 3/13: dowel HEP 08/17/22: Putty and coordination HEP  08/31/22: Diplopia HEP  GOALS:  SHORT TERM GOALS: Target date: 08/24/2022   Patient will be independent with initial LUE HEP. Baseline: Goal status: MET  2.  Patient will demonstrate at least 25 lbs L grip strength as needed to open jars and other containers. Baseline: Left: 16.7 lbs Goal status: IN PROGRESS  3.  Patient will be independent with diplopia HEP. Baseline: Initiated 4/17 Goal status: IN PROGRESS  LONG TERM GOALS: Target date: 10/21/2022  Patient will demonstrate updated LUE HEP with 25% verbal cues or less for proper execution. Baseline:  Goal status: INITIAL  2.  Patient will complete nine-hole peg with use of L in 35 seconds or less. Baseline: Left: 50 sec Goal status: IN PROGRESS  3.  Pt will complete d/c FOTO Baseline:  Goal status: INITIAL  4.  Patient will demonstrate at least 35 lbs L grip strength as needed to open jars and other containers. Baseline: Left: 16.7 lbs Goal status: INITIAL  5.  Pt will report no more than a little difficulty placing a can of soup (1lb) on a shelf at shoulder height using LUE.  Baseline: much difficulty Goal status: INITIAL  ASSESSMENT:  CLINICAL  IMPRESSION: Pt returns today for implementation of diplopia HEP, and review of strengthening/coordination HEP. Pt reports confirmation initiation of PCP for May 8. Pt stating vision continues to be an issue. Pt reports goal to be able to clean his house (picking up toys off the floor, washing dishes), keep up with his daughter (picking her up). He reports limited endurance with activity.  PERFORMANCE DEFICITS: in functional skills including ADLs, IADLs, coordination, sensation, ROM, strength, Fine motor control, Gross motor control, mobility, endurance, vision, and UE functional use.   IMPAIRMENTS: are limiting patient from ADLs, IADLs, work, and social participation.   CO-MORBIDITIES: may have co-morbidities  that affects occupational performance. Patient will benefit from skilled OT to address above impairments and improve overall function.  MODIFICATION OR ASSISTANCE TO COMPLETE EVALUATION: No modification of tasks or assist necessary to complete an evaluation.  OT OCCUPATIONAL PROFILE AND HISTORY: Problem focused assessment: Including review of records relating to presenting problem.  CLINICAL DECISION MAKING: LOW - limited treatment options, no task modification necessary  REHAB POTENTIAL: Good  EVALUATION COMPLEXITY: Low    PLAN:  OT FREQUENCY: 2x/week for 4 weeks and 1x/week for 4 weeks   OT DURATION:  12 sessions+eval  PLANNED INTERVENTIONS: self care/ADL training, therapeutic exercise, therapeutic activity, neuromuscular re-education, manual therapy, passive range of motion, functional mobility training, splinting, electrical stimulation, ultrasound, paraffin, fluidotherapy, moist heat, cryotherapy, contrast bath, patient/family education, visual/perceptual remediation/compensation, and Re-evaluation  RECOMMENDED OTHER SERVICES: none at this time  CONSULTED AND AGREED WITH PLAN OF CARE: Patient and significant other  PLAN FOR NEXT SESSION: continue grip strength Lt hand w/  gripper activity, add LUE strengthening HEP, review diplopia HEP and if scheduled with optometry, lifting/carrying box, LUE reaching with cuff wt, picking items off floor, review goals    [employer] Qu[place] Simkin, OT 08/31/2022, 11:04 AM

## 2022-08-31 NOTE — Patient Instructions (Signed)
Diplopia HEP:  Perform at least 3 times per day. Stop if your eye becomes fatigued or hurts and try again later.  1. Hold a small object/card in front of you.  Hold it in the middle at arm's length away.    2. Cover your LEFT eye and look at the object with your RIGHT eye.  3. Slowly move the object side to side in front of you while continuing to watch it with your RIGHT eye.  4.  Remember to keep your head still and only move your eye.  5.  Repeat 5-10 times.  6.  Then, move object up and down while watching it 5-10 times.  7. Cover your RIGHT eye and look at the object with your LEFT eye while you repeat #1-6 above.  8.  Now, uncover both eyes and try to focus on the object while holding it in the middle.  Try to make it 1 image.   9.  If you can, try to hold it for 10-30 sec increasing as able.    10.  Once you can make the image 1 for at least 30 sec in the middle, repeat #1-6 above with both eyes moving slowly and only in the range that you can keep the image 1.   11. Hold card arm's length away. Bring towards face about 6 inches away, then remove back away from face. Do this slowly.

## 2022-08-31 NOTE — Therapy (Signed)
OUTPATIENT PHYSICAL THERAPY NEURO TREATMENT   Patient Name: Jonathan [name] MRN: [id number] DOB:09/19/1975, 47 y.o., male Today's Date: 08/31/2022   PCP: None on file REFERRING PROVIDER: Jacquelynn Cree, PA-C  END OF SESSION:  PT End of Session - 08/31/22 0935     Visit Number 4    Number of Visits 13    Date for PT Re-Evaluation 09/28/22    Authorization Type [employer] (30 VL PT/OT hard max)    PT Start Time 0932    PT Stop Time 1015    PT Time Calculation (min) 43 min    Equipment Utilized During Treatment Gait belt    Activity Tolerance Patient tolerated treatment well    Behavior During Therapy WFL for tasks assessed/performed                 Past Medical History:  Diagnosis Date   Chest pain 12/21/2011   Hypertension    Lower back pain    No past surgical history on file. Patient Active Problem List   Diagnosis Date Noted   Left spastic hemiparesis 08/05/2022   HTN (hypertension) 07/08/2022   Acute renal failure superimposed on chronic kidney disease 07/08/2022   Elevated lipids 07/08/2022   Right basal ganglia embolic stroke 07/04/2022   Acute ischemic stroke 06/30/2022   Noncompliance with medication regimen 06/30/2022   Essential hypertension 11/03/2021   Tobacco abuse 12/21/2011   Alcoholism 12/21/2011   VERTIGO NOS OR DIZZINESS 07/13/2006    ONSET DATE: 07/07/2022  REFERRING DIAG: I63.9 (ICD-10-CM) - Cerebral infarction, unspecified  THERAPY DIAG:  Muscle weakness (generalized)  Hemiplegia and hemiparesis following cerebral infarction affecting left non-dominant side  Other abnormalities of gait and mobility  Rationale for Evaluation and Treatment: Rehabilitation  SUBJECTIVE:                                                                                                                                                                                             SUBJECTIVE STATEMENT: Pt reports he did not call to make appointment w/PCP.  Does not have a BP cuff at home, but is working on getting one. Declined resources to get one. Has not had any headaches since getting his prescription refilled.    PERTINENT HISTORY: 47 y.o. male with of untreated hypertension and tobacco use who was admitted on 06/30/2022 with left arm and left leg weakness with that started the night before.  CTA head/neck was negative for LVO.  UDS was positive for cocaine.  MRI brain showed small acute infarct in white matter tracts in right basal ganglia and overlying frontal lobe.  Dr. Pearlean Brownie felt  the stroke was due to small vessel disease and cocaine use.  He recommended DAPT x 3 weeks followed by ASA alone as well as participation in sleep Study given risk of sleep apnea.  Patient was independent prior to admission and therapy evaluations revealed deficits due to LLE weakness with knee instability, LUE weakness with sensory deficits as well as decrease in safety awareness.  PAIN:  Are you having pain? No Pt reports frequent pain in LUE, distal to elbow   VITALS  Vitals:   08/31/22 0937  BP: (!) 153/98  Pulse: 85      BP after scifit: 148/92 HR: 96  PRECAUTIONS: Fall  PATIENT GOALS: "Get back in shape and move around like I used to"  TODAY'S TREATMENT:          Ther Act  STG Assessment   [facility] PT Assessment - 08/31/22 0943       Transfers   Five time sit to stand comments  20.78s w/BUE support      Ambulation/Gait   Gait velocity 32.8' over 10.5s = 3.12 ft/s no AD      Berg Balance Test   Sit to Stand Able to stand without using hands and stabilize independently    Standing Unsupported Able to stand safely 2 minutes    Sitting with Back Unsupported but Feet Supported on Floor or Stool Able to sit safely and securely 2 minutes    Stand to Sit Sits safely with minimal use of hands    Transfers Able to transfer safely, minor use of hands    Standing Unsupported with Eyes Closed Able to stand 10 seconds safely    Standing Unsupported with  Feet Together Able to place feet together independently and stand 1 minute safely    From Standing, Reach Forward with Outstretched Arm Can reach confidently >25 cm (10")    From Standing Position, Pick up Object from Floor Able to pick up shoe, needs supervision      Timed Up and Go Test   Normal TUG (seconds) 10.68   no AD           Ther Ex Performed goblet squats using 20# KB (pt has this equipment at home) x8 reps for improved BLE strength and endurance. Pt reports he has been doing light UE work w/dumbbells at home but has not been working on legs or walking. Implemented walking program with patient, starting at walking for 3 minutes, resting for 1-2 minutes and repeated 2-3x. Pt verbalized understanding. Also encouraged pt to perform drop sets w/goblet squats at home, which pt also verbalized and demonstrated understanding.    PATIENT EDUCATION: Education details: Importance of walking program, returning to gym activity slowly to build endurance and strength Person educated: Patient and Fiance Education method: Explanation, Demonstration, and Handouts Education comprehension: verbalized understanding and needs further education  HOME EXERCISE PROGRAM: Access Code: [email or web address] URL: [email or web address] Date: 08/05/2022 Prepared by: Merry Lofty  Exercises - Side Stepping with Resistance at Thighs  - 1 x daily - 7 x weekly - 3 sets - 10 reps - Forward Monster Walk with Resistance at [employer] and Counter Support  - 1 x daily - 7 x weekly - 3 sets - 10 reps - Backward Monster Walk with Resistance at [employer] and Counter Support  - 1 x daily - 7 x weekly - 3 sets - 10 reps - Standing 3-Way Leg Reach with Resistance at Ankles and Counter Support  - 1 x daily - 7 x weekly -  2 sets - 10 reps - Deadlift with Resistance  - 1 x daily - 7 x weekly - 2 sets - 10 reps  You Can Walk For A Certain Length Of Time Each Day                          Walk 5 minutes 2 times per day.              Increase 2  minutes every 3-4 days              Work up to 30 minutes (1-2 times per day).   GOALS: Goals reviewed with patient? Yes  SHORT TERM GOALS: Target date: 08/24/2022   Pt will perform initial HEP and walking program w/CGA from fiance for improved strength, balance, transfers and gait.  Baseline: Not established on eval  Goal status: MET  2.  Patient will improve Berg balance scale score to >/= 50/56 to demonstrate improved balance and decreased risk for falls Baseline: 44/56 Goal status: IN PROGRESS  3.  Pt will improve 5 x STS to less than or equal to 18 seconds w/RUE support and good immediate standing balance and S* to demonstrate improved functional strength and transfer efficiency.   Baseline: 18.83s w/heavy lean to R side and poor immediate standing balance w/CGA; 20.78s w/BUE support  Goal status: NOT MET  4.  Pt will improve normal TUG to less than or equal to 15 seconds w/LRAD and S* for improved functional mobility and decreased fall risk.  Baseline: 17.31s w/SPC and CGA; 10.68s no AD Goal status: MET  5.  Pt will improve gait velocity to at least 2.0 ft/s w/LRAD and S* for improved gait efficiency and reduced recurrent fall risk   Baseline: 1.75 ft/s w/SPC and CGA; 3.12 ft/s no AD Goal status: MET  6.  Patient will negotiate at least 4 steps with no HR (per home set up) and no more than supervision Baseline: B HR and CGA Goal status: REVISED  LONG TERM GOALS: Target date: 09/21/2022   Pt will be independent with final HEP and walking program for improved strength, balance, transfers and gait.  Baseline:  Goal status: INITIAL  2.  Berg goal Baseline: not necessary as patient scored 44/56 initially Goal status: INITIAL  3.  Pt will improve 5 x STS to less than or equal to 15 seconds w/RUE support mod I to demonstrate improved functional strength and transfer efficiency.   Baseline: 18.83s w/heavy lean to R side and poor immediate standing  balance w/CGA Goal status: INITIAL  4.  Pt will improve normal TUG to less than or equal to 12 seconds w/LRAD mod I for improved functional mobility and decreased fall risk.  Baseline: 17.31s w/SPC and CGA Goal status: INITIAL  5.  Pt will improve FOTO to 69 for improved self-perceived functional ability and return to PLOF Baseline: 50 (3/13) Goal status: INITIAL  6.  Pt will improve gait velocity to at least 3.5 ft/s w/LRAD mod I for improved gait efficiency  Baseline: 1.75 ft/s w/SPC and CGA; 3.12 ft/s no AD (4/17) Goal status: REVISED  ASSESSMENT:  CLINICAL IMPRESSION: Emphasis of skilled PT session on STG assessment, BLE strength and endurance. Pt has met 3 of 6 STGs this date, demonstrating independence w/HEP and improving his walking speed as indicated by and TUG. Pt fatigued very quickly while performing Berg and required several seated rest breaks, so used that time to discuss implementation  of walking program/lifting at home. Currently, pt reports he walks ~5 minutes/day but otherwise is in bed watching TV. Pt in agreement that he needs to be more active, so decided to start w/walking intervals (walk , rest 2 x3) to build endurance and confidence with gait. Pt's fiance did make PCP appointment during session, scheduled for May 8th. Will continue to closely monitor BP until pt sees PCP. Continue POC.     OBJECTIVE IMPAIRMENTS: Abnormal gait, decreased activity tolerance, decreased balance, decreased coordination, decreased endurance, decreased knowledge of condition, decreased knowledge of use of DME, decreased mobility, difficulty walking, decreased strength, decreased safety awareness, impaired sensation, impaired UE functional use, improper body mechanics, and pain  ACTIVITY LIMITATIONS: carrying, lifting, bending, squatting, sleeping, stairs, transfers, bed mobility, dressing, reach over head, hygiene/grooming, locomotion level, and caring for others  PARTICIPATION  LIMITATIONS: meal prep, cleaning, laundry, medication management, personal finances, interpersonal relationship, driving, shopping, community activity, occupation, and yard work  PERSONAL FACTORS: Education, Fitness, Past/current experiences, and 1 comorbidity: HTN  are also affecting patient's functional outcome.   REHAB POTENTIAL: Good  CLINICAL DECISION MAKING: Evolving/moderate complexity  EVALUATION COMPLEXITY: Moderate  PLAN:  PT FREQUENCY:  2x/week for 4 weeks and 1x/week for 4 weeks  PT DURATION: 8 weeks  PLANNED INTERVENTIONS: Therapeutic exercises, Therapeutic activity, Neuromuscular re-education, Balance training, Gait training, Patient/Family education, Self Care, Joint mobilization, Stair training, Vestibular training, Canalith repositioning, Orthotic/Fit training, DME instructions, Aquatic Therapy, Dry Needling, Manual therapy, and Re-evaluation  PLAN FOR NEXT SESSION: Check BP. Review HEP PRN for LLE NMR/strength: Hip flex/add/abd, sit <>stands, weight shifting, sit <>stands w/shift to L side, staggered DL, squats, TM    Cynitha Berte E Lizvette Lightsey, PT, DPT 08/31/2022, 10:19 AM

## 2022-09-01 ENCOUNTER — Other Ambulatory Visit (HOSPITAL_COMMUNITY): Payer: Self-pay

## 2022-09-07 ENCOUNTER — Ambulatory Visit: Payer: Self-pay | Admitting: Rehabilitative and Restorative Service Providers"

## 2022-09-07 ENCOUNTER — Ambulatory Visit: Payer: Self-pay | Admitting: Physical Therapy

## 2022-09-07 ENCOUNTER — Encounter: Payer: Commercial Managed Care - HMO | Admitting: Speech Pathology

## 2022-09-09 ENCOUNTER — Ambulatory Visit: Payer: Self-pay

## 2022-09-14 ENCOUNTER — Encounter: Payer: Commercial Managed Care - HMO | Admitting: Speech Pathology

## 2022-09-14 ENCOUNTER — Ambulatory Visit: Payer: Medicaid Other

## 2022-09-14 ENCOUNTER — Ambulatory Visit: Payer: Medicaid Other | Admitting: Occupational Therapy

## 2022-09-14 NOTE — Progress Notes (Shared)
Jonathan [name] is a 47 y.o. male here for a new patient visit.  History of Present Illness:   No chief complaint on file.   HPI  Hypertension Treated with amlodipine 10 mg daily, carvedilol 6.24 mg twice daily with a meal, irbesartan 75 mg daily.  Hyperlipidemia Treated with atorvastatin 20 mg daily.  Lower Back Pain Treated with gabapentin 100 mg daily at bedtime.  Insomnia Treated with trazodone 50 mg at bedtime as needed.  Past Medical History:  Diagnosis Date   Chest pain 12/21/2011   Hypertension    Lower back pain      Social History   Tobacco Use   Smoking status: Every Day    Packs/day: 1    Types: Cigarettes   Smokeless tobacco: Never  Vaping Use   Vaping Use: Never used  Substance Use Topics   Alcohol use: Yes    Comment: some   Drug use: Yes    Types: Marijuana    No past surgical history on file.  Family History  Problem Relation Age of Onset   Cancer Mother    Diabetes Maternal Grandmother    Hypertension Other     No Known Allergies  Current Medications:   Current Outpatient Medications:    acetaminophen (TYLENOL) 325 MG tablet, Take 1-2 tablets (325-650 mg total) by mouth every 4 (four) hours as needed for mild pain., Disp: , Rfl:    amLODipine (NORVASC) 10 MG tablet, Take 1 tablet (10 mg total) by mouth daily., Disp: 30 tablet, Rfl: 0   aspirin (ASPIRIN LOW DOSE) 81 MG chewable tablet, Chew 1 tablet (81 mg total) by mouth daily., Disp: 30 tablet, Rfl: 0   atorvastatin (LIPITOR) 20 MG tablet, Take 1 tablet (20 mg total) by mouth daily., Disp: 30 tablet, Rfl: 0   Baclofen 5 MG TABS, Take 1 tablet (5 mg total) by mouth 3 (three) times daily as needed., Disp: 90 tablet, Rfl: 5   carvedilol (COREG) 6.25 MG tablet, Take 1 tablet (6.25 mg total) by mouth 2 (two) times daily with a meal., Disp: 60 tablet, Rfl: 0   gabapentin (NEURONTIN) 100 MG capsule, Take 1 capsule (100 mg total) by mouth at bedtime., Disp: 30 capsule, Rfl: 5    irbesartan (AVAPRO) 75 MG tablet, Take 1 tablet (75 mg total) by mouth daily., Disp: 30 tablet, Rfl: 0   Multiple Vitamins-Minerals (MULTIVITAMIN MEN) TABS, Take 1 tablet by mouth daily with breakfast., Disp: , Rfl:    nicotine (NICODERM CQ - DOSED IN MG/24 HR) 7 mg/24hr patch, Place 1 patch (7 mg total) onto the skin daily. (Patient not taking: Reported on 08/05/2022), Disp: 28 patch, Rfl: 0   traZODone (DESYREL) 50 MG tablet, Take 1 tablet (50 mg total) by mouth at bedtime as needed for sleep., Disp: 30 tablet, Rfl: 0   Review of Systems:   ROS  Vitals:   There were no vitals filed for this visit.   There is no height or weight on file to calculate BMI.  Physical Exam:   Physical Exam  Assessment and Plan:   ***   I,Alexander Ruley,acting as a scribe for Jarold Motto, PA.,have documented all relevant documentation on the behalf of Jarold Motto, PA,as directed by  Jarold Motto, PA while in the presence of Jarold Motto, [place].   ***   Jarold Motto, PA-C

## 2022-09-16 ENCOUNTER — Encounter: Payer: Commercial Managed Care - HMO | Admitting: Occupational Therapy

## 2022-09-16 ENCOUNTER — Ambulatory Visit: Payer: Medicaid Other | Attending: Physical Medicine and Rehabilitation | Admitting: Physical Therapy

## 2022-09-16 ENCOUNTER — Encounter: Payer: Self-pay | Admitting: Physical Therapy

## 2022-09-16 NOTE — Therapy (Signed)
[facility] [address] [place], [place], [address] Phone: [phone]   Fax:  [phone]  Patient Details  Name: Jonathan [name] MRN: [id number] Date of Birth: 24-Jul-1975 Referring Provider:  No ref. provider found  Encounter Date: 09/16/2022  Called pt and left VM regarding no-show to today's scheduled PT appointment and it being last scheduled appointment. Informed pt to call back if he wants to add more appointments but if he does not call by 5/6, PT will DC.   [name], PT, DPT 09/16/2022, 10:29 AM  [facility] [address] [place], [place], [address] Phone: [phone]   Fax:  [phone]

## 2022-09-21 ENCOUNTER — Ambulatory Visit: Payer: Self-pay | Admitting: Physician Assistant

## 2022-09-21 ENCOUNTER — Other Ambulatory Visit: Payer: Self-pay | Admitting: Physical Medicine & Rehabilitation

## 2022-09-22 ENCOUNTER — Other Ambulatory Visit: Payer: Self-pay | Admitting: Physical Medicine & Rehabilitation

## 2022-09-22 DIAGNOSIS — I10 Essential (primary) hypertension: Secondary | ICD-10-CM

## 2022-09-22 DIAGNOSIS — I639 Cerebral infarction, unspecified: Secondary | ICD-10-CM

## 2022-09-30 ENCOUNTER — Encounter: Payer: Self-pay | Attending: Physical Medicine & Rehabilitation | Admitting: Physical Medicine & Rehabilitation

## 2022-09-30 DIAGNOSIS — G8114 Spastic hemiplegia affecting left nondominant side: Secondary | ICD-10-CM | POA: Insufficient documentation

## 2022-10-07 ENCOUNTER — Ambulatory Visit: Payer: Medicaid Other

## 2022-10-07 ENCOUNTER — Encounter: Payer: Self-pay | Admitting: Physical Therapy

## 2022-10-07 NOTE — Therapy (Signed)
[facility] [address] [place], [place], [address] Phone: [phone]   Fax:  [phone]  Patient Details  Name: Jonathan [name] MRN: [id number] Date of Birth: [birth year] Referring Provider:  No ref. provider found  Encounter Date: 10/07/2022  PHYSICAL THERAPY DISCHARGE SUMMARY  Visits from Start of Care: 4  Current functional level related to goals / functional outcomes: N/A as pt has not been seen for >30 days    Remaining deficits: L hemiparesis    Education / Equipment: HEP   Patient agrees to discharge. Patient goals were not met. Patient is being discharged due to not returning since the last visit.    Jill Alexanders David Rodriquez, PT, DPT 10/07/2022, 3:05 PM  [facility] [address] [place], [place], [address] Phone: [phone]   Fax:  [phone]

## 2022-10-14 ENCOUNTER — Ambulatory Visit: Payer: Medicaid Other

## 2022-10-14 NOTE — Therapy (Signed)
OCCUPATIONAL THERAPY DISCHARGE SUMMARY  Visits from Start of Care: 3  Current functional level related to goals / functional outcomes: Patient last seen 08/31/22. Patient has cancelled or has been no show for last 5 visits.   Remaining deficits: Left-sided hemiparesis, visual deficits   Education / Equipment: HEP   Patient agrees to discharge. Patient goals were not met. Patient is being discharged due to not returning since the last visit.   [employer], OTR/L

## 2022-11-27 ENCOUNTER — Other Ambulatory Visit: Payer: Self-pay | Admitting: Physical Medicine & Rehabilitation

## 2022-11-27 DIAGNOSIS — R351 Nocturia: Secondary | ICD-10-CM

## 2022-11-28 ENCOUNTER — Ambulatory Visit (INDEPENDENT_AMBULATORY_CARE_PROVIDER_SITE_OTHER): Payer: Medicaid Other | Admitting: Primary Care

## 2022-11-28 ENCOUNTER — Encounter (INDEPENDENT_AMBULATORY_CARE_PROVIDER_SITE_OTHER): Payer: Self-pay | Admitting: Primary Care

## 2022-11-28 VITALS — BP 160/94 | HR 92 | Resp 16 | Ht 73.5 in | Wt 264.0 lb

## 2022-11-28 DIAGNOSIS — I10 Essential (primary) hypertension: Secondary | ICD-10-CM | POA: Diagnosis not present

## 2022-11-28 DIAGNOSIS — R351 Nocturia: Secondary | ICD-10-CM

## 2022-11-28 DIAGNOSIS — E876 Hypokalemia: Secondary | ICD-10-CM

## 2022-11-28 DIAGNOSIS — Z1159 Encounter for screening for other viral diseases: Secondary | ICD-10-CM

## 2022-11-28 DIAGNOSIS — E559 Vitamin D deficiency, unspecified: Secondary | ICD-10-CM | POA: Diagnosis not present

## 2022-11-28 DIAGNOSIS — Z8673 Personal history of transient ischemic attack (TIA), and cerebral infarction without residual deficits: Secondary | ICD-10-CM

## 2022-11-28 DIAGNOSIS — Z6834 Body mass index (BMI) 34.0-34.9, adult: Secondary | ICD-10-CM

## 2022-11-28 DIAGNOSIS — E785 Hyperlipidemia, unspecified: Secondary | ICD-10-CM

## 2022-11-28 DIAGNOSIS — Z7689 Persons encountering health services in other specified circumstances: Secondary | ICD-10-CM

## 2022-11-28 DIAGNOSIS — E6609 Other obesity due to excess calories: Secondary | ICD-10-CM | POA: Diagnosis not present

## 2022-11-28 MED ORDER — GABAPENTIN 100 MG PO CAPS: 100.0000 mg | ORAL_CAPSULE | Freq: Every day | ORAL | 1 refills | Status: DC

## 2022-11-28 MED ORDER — AMLODIPINE BESYLATE 10 MG PO TABS: 10.0000 mg | ORAL_TABLET | Freq: Every day | ORAL | 1 refills | Status: DC

## 2022-11-28 NOTE — Telephone Encounter (Signed)
Patient last seen 3/24

## 2022-11-28 NOTE — Progress Notes (Unsigned)
[facility]   Subjective:   Jonathan [name] is a 47 y.o. male presents for hospital follow up and establish care. Admit date to the hospital was 07/04/22, patient was discharged from the hospital on 07/08/22, patient was admitted for:   Past Medical History:  Diagnosis Date   Chest pain 12/21/2011   Hypertension    Lower back pain      No Known Allergies    Current Outpatient Medications on File Prior to Visit  Medication Sig Dispense Refill   acetaminophen (TYLENOL) 325 MG tablet Take 1-2 tablets (325-650 mg total) by mouth every 4 (four) hours as needed for mild pain.     aspirin (ASPIRIN LOW DOSE) 81 MG chewable tablet Chew 1 tablet (81 mg total) by mouth daily. 30 tablet 0   atorvastatin (LIPITOR) 20 MG tablet Take 1 tablet (20 mg total) by mouth daily. 30 tablet 0   Baclofen 5 MG TABS Take 1 tablet (5 mg total) by mouth 3 (three) times daily as needed. 90 tablet 5   carvedilol (COREG) 6.25 MG tablet TAKE 1 TABLET BY MOUTH TWICE A DAY WITH A MEAL 60 tablet 0   gabapentin (NEURONTIN) 100 MG capsule Take 1 capsule (100 mg total) by mouth at bedtime. 30 capsule 5   irbesartan (AVAPRO) 75 MG tablet TAKE 1 TABLET BY MOUTH EVERY DAY 30 tablet 0   Multiple Vitamins-Minerals (MULTIVITAMIN MEN) TABS Take 1 tablet by mouth daily with breakfast.     traZODone (DESYREL) 50 MG tablet TAKE 1 TABLET BY MOUTH EVERY DAY AT BEDTIME AS NEEDED FOR SLEEP 30 tablet 0   nicotine (NICODERM CQ - DOSED IN MG/24 HR) 7 mg/24hr patch Place 1 patch (7 mg total) onto the skin daily. (Patient not taking: Reported on 08/05/2022) 28 patch 0   No current facility-administered medications on file prior to visit.     Review of System: ROS  Objective:  Blood Pressure (Abnormal) 157/105 (BP Location: Left Arm, Patient Position: Sitting, Cuff Size: Large)   Pulse 92   Respiration 16   Height 6' 1.5" (1.867 m)   Weight 264 lb (119.7 kg)   Oxygen Saturation 98%   Body Mass Index 34.36 kg/m    Filed Weights   11/28/22 0902  Weight: 264 lb (119.7 kg)    Physical Exam:   General Appearance: Well nourished, in no apparent distress. Eyes: PERRLA, EOMs, conjunctiva no swelling or erythema Sinuses: No Frontal/maxillary tenderness ENT/Mouth: Ext aud canals clear, TMs without erythema, bulging. No erythema, swelling, or exudate on post pharynx.  Tonsils not swollen or erythematous. Hearing normal.  Neck: Supple, thyroid normal.  Respiratory: Respiratory effort normal, BS equal bilaterally without rales, rhonchi, wheezing or stridor.  Cardio: RRR with no MRGs. Brisk peripheral pulses without edema.  Abdomen: Soft, + BS.  Non tender, no guarding, rebound, hernias, masses. Lymphatics: Non tender without lymphadenopathy.  Musculoskeletal: Full ROM, 5/5 strength, normal gait.  Skin: Warm, dry without rashes, lesions, ecchymosis.  Neuro: Cranial nerves intact. Normal muscle tone, no cerebellar symptoms. Sensation intact.  Psych: Awake and oriented X 3, normal affect, Insight and Judgment appropriate.    Assessment:   No diagnosis found.  Meds ordered this encounter  Medications   amLODipine (NORVASC) 10 MG tablet    Sig: Take 1 tablet (10 mg total) by mouth daily.    Dispense:  90 tablet    Refill:  1    Order Specific Question:   Supervising Provider    Answer:  Manish Angst [[id number]]    This note has been created with Education officer, environmental. Any transcriptional errors are unintentional.   Grayce Sessions, NP 11/28/2022, 10:07 AM

## 2022-11-29 LAB — PSA: Prostate Specific Ag, Serum: 1.4 ng/mL (ref 0.0–4.0)

## 2022-11-29 LAB — CMP14+EGFR
ALT: 12 IU/L (ref 0–44)
AST: 13 IU/L (ref 0–40)
Albumin: 4.2 g/dL (ref 4.1–5.1)
Alkaline Phosphatase: 78 IU/L (ref 44–121)
BUN/Creatinine Ratio: 12 (ref 9–20)
BUN: 20 mg/dL (ref 6–24)
Bilirubin Total: <0.2 mg/dL (ref 0.0–1.2)
CO2: 22 mmol/L (ref 20–29)
Calcium: 9.3 mg/dL (ref 8.7–10.2)
Chloride: 106 mmol/L (ref 96–106)
Creatinine, Ser: 1.68 mg/dL — ABNORMAL HIGH (ref 0.76–1.27)
Globulin, Total: 2.9 g/dL (ref 1.5–4.5)
Glucose: 117 mg/dL — ABNORMAL HIGH (ref 70–99)
Potassium: 3.8 mmol/L (ref 3.5–5.2)
Sodium: 142 mmol/L (ref 134–144)
Total Protein: 7.1 g/dL (ref 6.0–8.5)
eGFR: 50 mL/min/{1.73_m2} — ABNORMAL LOW (ref >59)

## 2022-11-29 LAB — CBC WITH DIFFERENTIAL/PLATELET
Basophils Absolute: 0 10*3/uL (ref 0.0–0.2)
Basos: 0 %
EOS (ABSOLUTE): 0.1 10*3/uL (ref 0.0–0.4)
Eos: 2 %
Hematocrit: 37.7 % (ref 37.5–51.0)
Hemoglobin: 12.6 g/dL — ABNORMAL LOW (ref 13.0–17.7)
Immature Grans (Abs): 0 10*3/uL (ref 0.0–0.1)
Immature Granulocytes: 0 %
Lymphocytes Absolute: 1.7 10*3/uL (ref 0.7–3.1)
Lymphs: 33 %
MCH: 30.3 pg (ref 26.6–33.0)
MCHC: 33.4 g/dL (ref 31.5–35.7)
MCV: 91 fL (ref 79–97)
Monocytes Absolute: 0.4 10*3/uL (ref 0.1–0.9)
Monocytes: 8 %
Neutrophils Absolute: 3 10*3/uL (ref 1.4–7.0)
Neutrophils: 57 %
Platelets: 234 10*3/uL (ref 150–450)
RBC: 4.16 x10E6/uL (ref 4.14–5.80)
RDW: 14.1 % (ref 11.6–15.4)
WBC: 5.2 10*3/uL (ref 3.4–10.8)

## 2022-11-29 LAB — LIPID PANEL
Chol/HDL Ratio: 4.4 ratio (ref 0.0–5.0)
Cholesterol, Total: 159 mg/dL (ref 100–199)
HDL: 36 mg/dL — ABNORMAL LOW (ref >39)
LDL Chol Calc (NIH): 101 mg/dL — ABNORMAL HIGH (ref 0–99)
Triglycerides: 120 mg/dL (ref 0–149)
VLDL Cholesterol Cal: 22 mg/dL (ref 5–40)

## 2022-11-29 LAB — HCV INTERPRETATION

## 2022-11-29 LAB — HCV AB W REFLEX TO QUANT PCR: HCV Ab: NONREACTIVE

## 2022-11-29 LAB — VITAMIN D 25 HYDROXY (VIT D DEFICIENCY, FRACTURES): Vit D, 25-Hydroxy: 12.3 ng/mL — ABNORMAL LOW (ref 30.0–100.0)

## 2022-12-03 ENCOUNTER — Other Ambulatory Visit (INDEPENDENT_AMBULATORY_CARE_PROVIDER_SITE_OTHER): Payer: Self-pay | Admitting: Primary Care

## 2022-12-03 MED ORDER — ERGOCALCIFEROL 1.25 MG (50000 UT) PO CAPS: 50000.0000 [IU] | ORAL_CAPSULE | ORAL | 0 refills | Status: DC

## 2022-12-12 ENCOUNTER — Encounter: Payer: Self-pay | Admitting: Neurology

## 2022-12-19 ENCOUNTER — Ambulatory Visit (INDEPENDENT_AMBULATORY_CARE_PROVIDER_SITE_OTHER): Payer: Medicaid Other | Admitting: Primary Care

## 2022-12-19 ENCOUNTER — Encounter (INDEPENDENT_AMBULATORY_CARE_PROVIDER_SITE_OTHER): Payer: Self-pay | Admitting: Primary Care

## 2022-12-19 VITALS — BP 147/87 | HR 72 | Resp 16 | Wt 264.6 lb

## 2022-12-19 DIAGNOSIS — G4733 Obstructive sleep apnea (adult) (pediatric): Secondary | ICD-10-CM | POA: Diagnosis not present

## 2022-12-19 DIAGNOSIS — I10 Essential (primary) hypertension: Secondary | ICD-10-CM | POA: Diagnosis not present

## 2022-12-19 MED ORDER — AMLODIPINE BESYLATE 10 MG PO TABS
10.0000 mg | ORAL_TABLET | Freq: Every day | ORAL | 1 refills | Status: DC
Start: 2022-12-19 — End: 2023-03-21

## 2022-12-19 NOTE — Progress Notes (Signed)
[facility]  Jonathan [name], is a 47 y.o. male  PIR:[id number]  YTK:[id number]  DOB - Sep 21, 1975  Chief Complaint  Patient presents with   Hypertension       Subjective:   Jonathan [name] is a 47 y.o. male here today for a follow up visit for HTN but not taking on regular bases. Patient has No headache, No chest pain, No abdominal pain - No Nausea, No new weakness tingling or numbness, No Cough - shortness of breath.  Discussed risk factors African-American male, obesity, prediabetic, noncompliant blood sugar medicine increased risk for stroke heart attack discussed with him plans for increase in life insurance policy and burial insurance.  Patient will be doing better because he would like to be around to take care of his wife and daughter.  No problems updated.  No Known Allergies  Past Medical History:  Diagnosis Date   Chest pain 12/21/2011   Hypertension    Lower back pain     Current Outpatient Medications on File Prior to Visit  Medication Sig Dispense Refill   acetaminophen (TYLENOL) 325 MG tablet Take 1-2 tablets (325-650 mg total) by mouth every 4 (four) hours as needed for mild pain.     amLODipine (NORVASC) 10 MG tablet TAKE 1 TABLET BY MOUTH EVERY DAY 30 tablet 0   aspirin (ASPIRIN LOW DOSE) 81 MG chewable tablet Chew 1 tablet (81 mg total) by mouth daily. 30 tablet 0   atorvastatin (LIPITOR) 20 MG tablet Take 1 tablet (20 mg total) by mouth daily. 30 tablet 0   Baclofen 5 MG TABS Take 1 tablet (5 mg total) by mouth 3 (three) times daily as needed. 90 tablet 5   carvedilol (COREG) 6.25 MG tablet TAKE 1 TABLET BY MOUTH TWICE A DAY WITH FOOD 180 tablet 1   ergocalciferol (VITAMIN D2) 1.25 MG (50000 UT) capsule Take 1 capsule (50,000 Units total) by mouth once a week. 8 capsule 0   gabapentin (NEURONTIN) 100 MG capsule Take 1 capsule (100 mg total) by mouth at bedtime. 90 capsule 1   irbesartan (AVAPRO) 75 MG tablet TAKE 1 TABLET BY MOUTH EVERY  DAY 90 tablet 1   Multiple Vitamins-Minerals (MULTIVITAMIN MEN) TABS Take 1 tablet by mouth daily with breakfast.     traZODone (DESYREL) 100 MG tablet Take 1 tablet (100 mg total) by mouth at bedtime. 90 tablet 1   No current facility-administered medications on file prior to visit.    Objective:   Vitals:   12/19/22 1011 12/19/22 1012  BP: (Abnormal) 147/83 (Abnormal) 147/87  Pulse: 72   Resp: 16   SpO2: 95%   Weight: 264 lb 9.6 oz (120 kg)     Comprehensive ROS Pertinent positive and negative noted in HPI   Exam General appearance : Awake, alert, not in any distress. Speech Clear. Not toxic looking HEENT: Atraumatic and Normocephalic, pupils equally reactive to light and accomodation Neck: Supple, no JVD. No cervical lymphadenopathy.  Chest: Good air entry bilaterally, no added sounds  CVS: S1 S2 regular, no murmurs.  Abdomen: Bowel sounds present, Non tender and not distended with no gaurding, rigidity or rebound. Extremities: B/L Lower Ext shows no edema, both legs are warm to touch Neurology: Awake alert, and oriented X 3, CN II-XII intact, Non focal Skin: No Rash  Data Review Lab Results  Component Value Date   HGBA1C 5.9 (H) 06/30/2022    Assessment & Plan   Michaelpaul was seen today for hypertension.  Diagnoses and all  orders for this visit:  Essential hypertension DASH DIET; No salt or low sodium diet Take all medication as prescribed. Avoid smoked meats which are high in sodium content. Avoid soda which contains sodium and are high in sugar which increases your risk for diabetes.  OSA (obstructive sleep apnea)  Message sent Dr. Maple Hudson  Patient have been counseled extensively about nutrition and exercise. Other issues discussed during this visit include: low cholesterol diet, weight control and daily exercise, foot care, annual eye examinations at Ophthalmology, importance of adherence with medications and regular follow-up. We also discussed long term  complications of uncontrolled diabetes and hypertension.   Return in about 4 weeks (around 01/16/2023) for BP.  The patient was given clear instructions to go to ER or return to medical center if symptoms don't improve, worsen or new problems develop. The patient verbalized understanding. The patient was told to call to get lab results if they haven't heard anything in the next week.   This note has been created with Education officer, environmental. Any transcriptional errors are unintentional.   Grayce Sessions, NP 12/19/2022, 10:49 AM

## 2022-12-19 NOTE — Patient Instructions (Signed)
Calorie Counting for Weight Loss Calories are units of energy. Your body needs a certain number of calories from food to keep going throughout the day. When you eat or drink more calories than your body needs, your body stores the extra calories mostly as fat. When you eat or drink fewer calories than your body needs, your body burns fat to get the energy it needs. Calorie counting means keeping track of how many calories you eat and drink each day. Calorie counting can be helpful if you need to lose weight. If you eat fewer calories than your body needs, you should lose weight. Ask your health care provider what a healthy weight is for you. For calorie counting to work, you will need to eat the right number of calories each day to lose a healthy amount of weight per week. A dietitian can help you figure out how many calories you need in a day and will suggest ways to reach your calorie goal. A healthy amount of weight to lose each week is usually 1-2 lb (0.5-0.9 kg). This usually means that your daily calorie intake should be reduced by 500-750 calories. Eating 1,200-1,500 calories a day can help most women lose weight. Eating 1,500-1,800 calories a day can help most men lose weight. What do I need to know about calorie counting? Work with your health care provider or dietitian to determine how many calories you should get each day. To meet your daily calorie goal, you will need to: Find out how many calories are in each food that you would like to eat. Try to do this before you eat. Decide how much of the food you plan to eat. Keep a food log. Do this by writing down what you ate and how many calories it had. To successfully lose weight, it is important to balance calorie counting with a healthy lifestyle that includes regular activity. Where do I find calorie information?  The number of calories in a food can be found on a Nutrition Facts label. If a food does not have a Nutrition Facts label, try  to look up the calories online or ask your dietitian for help. Remember that calories are listed per serving. If you choose to have more than one serving of a food, you will have to multiply the calories per serving by the number of servings you plan to eat. For example, the label on a package of bread might say that a serving size is 1 slice and that there are 90 calories in a serving. If you eat 1 slice, you will have eaten 90 calories. If you eat 2 slices, you will have eaten 180 calories. How do I keep a food log? After each time that you eat, record the following in your food log as soon as possible: What you ate. Be sure to include toppings, sauces, and other extras on the food. How much you ate. This can be measured in cups, ounces, or number of items. How many calories were in each food and drink. The total number of calories in the food you ate. Keep your food log near you, such as in a pocket-sized notebook or on an app or website on your mobile phone. Some programs will calculate calories for you and show you how many calories you have left to meet your daily goal. What are some portion-control tips? Know how many calories are in a serving. This will help you know how many servings you can have of a certain   food. Use a measuring cup to measure serving sizes. You could also try weighing out portions on a kitchen scale. With time, you will be able to estimate serving sizes for some foods. Take time to put servings of different foods on your favorite plates or in your favorite bowls and cups so you know what a serving looks like. Try not to eat straight from a food's packaging, such as from a bag or box. Eating straight from the package makes it hard to see how much you are eating and can lead to overeating. Put the amount you would like to eat in a cup or on a plate to make sure you are eating the right portion. Use smaller plates, glasses, and bowls for smaller portions and to prevent  overeating. Try not to multitask. For example, avoid watching TV or using your computer while eating. If it is time to eat, sit down at a table and enjoy your food. This will help you recognize when you are full. It will also help you be more mindful of what and how much you are eating. What are tips for following this plan? Reading food labels Check the calorie count compared with the serving size. The serving size may be smaller than what you are used to eating. Check the source of the calories. Try to choose foods that are high in protein, fiber, and vitamins, and low in saturated fat, trans fat, and sodium. Shopping Read nutrition labels while you shop. This will help you make healthy decisions about which foods to buy. Pay attention to nutrition labels for low-fat or fat-free foods. These foods sometimes have the same number of calories or more calories than the full-fat versions. They also often have added sugar, starch, or salt to make up for flavor that was removed with the fat. Make a grocery list of lower-calorie foods and stick to it. Cooking Try to cook your favorite foods in a healthier way. For example, try baking instead of frying. Use low-fat dairy products. Meal planning Use more fruits and vegetables. One-half of your plate should be fruits and vegetables. Include lean proteins, such as chicken, turkey, and fish. Lifestyle Each week, aim to do one of the following: 150 minutes of moderate exercise, such as walking. 75 minutes of vigorous exercise, such as running. General information Know how many calories are in the foods you eat most often. This will help you calculate calorie counts faster. Find a way of tracking calories that works for you. Get creative. Try different apps or programs if writing down calories does not work for you. What foods should I eat?  Eat nutritious foods. It is better to have a nutritious, high-calorie food, such as an avocado, than a food with  few nutrients, such as a bag of potato chips. Use your calories on foods and drinks that will fill you up and will not leave you hungry soon after eating. Examples of foods that fill you up are nuts and nut butters, vegetables, lean proteins, and high-fiber foods such as whole grains. High-fiber foods are foods with more than 5 g of fiber per serving. Pay attention to calories in drinks. Low-calorie drinks include water and unsweetened drinks. The items listed above may not be a complete list of foods and beverages you can eat. Contact a dietitian for more information. What foods should I limit? Limit foods or drinks that are not good sources of vitamins, minerals, or protein or that are high in unhealthy fats. These   include: Candy. Other sweets. Sodas, specialty coffee drinks, alcohol, and juice. The items listed above may not be a complete list of foods and beverages you should avoid. Contact a dietitian for more information. How do I count calories when eating out? Pay attention to portions. Often, portions are much larger when eating out. Try these tips to keep portions smaller: Consider sharing a meal instead of getting your own. If you get your own meal, eat only half of it. Before you start eating, ask for a container and put half of your meal into it. When available, consider ordering smaller portions from the menu instead of full portions. Pay attention to your food and drink choices. Knowing the way food is cooked and what is included with the meal can help you eat fewer calories. If calories are listed on the menu, choose the lower-calorie options. Choose dishes that include vegetables, fruits, whole grains, low-fat dairy products, and lean proteins. Choose items that are boiled, broiled, grilled, or steamed. Avoid items that are buttered, battered, fried, or served with cream sauce. Items labeled as crispy are usually fried, unless stated otherwise. Choose water, low-fat milk,  unsweetened iced tea, or other drinks without added sugar. If you want an alcoholic beverage, choose a lower-calorie option, such as a glass of wine or light beer. Ask for dressings, sauces, and syrups on the side. These are usually high in calories, so you should limit the amount you eat. If you want a salad, choose a garden salad and ask for grilled meats. Avoid extra toppings such as bacon, cheese, or fried items. Ask for the dressing on the side, or ask for olive oil and vinegar or lemon to use as dressing. Estimate how many servings of a food you are given. Knowing serving sizes will help you be aware of how much food you are eating at restaurants. Where to find more information Centers for Disease Control and Prevention: www.cdc.gov [place] Department of Agriculture: myplate.gov Summary Calorie counting means keeping track of how many calories you eat and drink each day. If you eat fewer calories than your body needs, you should lose weight. A healthy amount of weight to lose per week is usually 1-2 lb (0.5-0.9 kg). This usually means reducing your daily calorie intake by 500-750 calories. The number of calories in a food can be found on a Nutrition Facts label. If a food does not have a Nutrition Facts label, try to look up the calories online or ask your dietitian for help. Use smaller plates, glasses, and bowls for smaller portions and to prevent overeating. Use your calories on foods and drinks that will fill you up and not leave you hungry shortly after a meal. This information is not intended to replace advice given to you by your health care provider. Make sure you discuss any questions you have with your health care provider. Document Revised: 06/13/2019 Document Reviewed: 06/13/2019 Elsevier Patient Education  2023 [employer]  

## 2023-01-23 ENCOUNTER — Ambulatory Visit (INDEPENDENT_AMBULATORY_CARE_PROVIDER_SITE_OTHER): Payer: Medicaid Other | Admitting: Primary Care

## 2023-02-14 ENCOUNTER — Encounter (INDEPENDENT_AMBULATORY_CARE_PROVIDER_SITE_OTHER): Payer: Self-pay

## 2023-02-14 ENCOUNTER — Ambulatory Visit (INDEPENDENT_AMBULATORY_CARE_PROVIDER_SITE_OTHER): Payer: Medicaid Other

## 2023-02-21 NOTE — Progress Notes (Unsigned)
NEUROLOGY CONSULTATION NOTE  BENNETTE MANIGAULT MRN: [id number] DOB: 29-Jan-1976  Referring provider: Gwinda Passe, NP Primary care provider: Gwinda Passe, NP  Reason for consult:  history of stroke  Assessment/Plan:   Right basal ganglia infarct secondary to small vessel disease Hypertension Hyperlipidemia Prediabetes Obstructive sleep apnea Tobacco use disorder/smoker Cocaine use, history of   Refer to PT/OT: To restart treatment of left sided weakness For treatment of right lumbar radiculopathy Secondary stroke prevention as managed by PCP: ASA 81mg  daily Statin.  LDL goal less than 70 Normotensive blood pressure Hgb A1c goal less than 7 Lifestyle modification: Smoking and drug use cessation CPAP Mediterranean diet Cardiovascular exercise Follow up 6 months.  Total time spent reviewing records, imaging and face to face with patient:  46 minutes.   Subjective:  Jonathan [name] is a 46 year old right-handed male with hypertension and smoking who presents for history of stroke.  History supplemented by hospital records and referring provider's notes.CT, CTA, and MRI personally reviewed.    On 06/30/2022, he developed left upper and lower extremity weakness and numbness.  Presented to [facility].  NIHSS was 3.  Outside therapeutic window for tPA.  CT head showed small acute right basal ganglia infarct confirmed on subsequent MRI.  CTA head and neck showed mild atheromatous changes but no LVO or hemodynamically significant stenosis.  2D eco showed EF 55-60% without atrial level shunt or other cardiac source of emboli.Marland Kitchen  LDL was 81 and Hgb A1c was 5.9.  Urine drug screen was positive for cocaine.  Started and discharged on ASA and Plavix DAPT for 3 weeks followed by Plavix alone.  He was also started on atorvastatin 20mg  daily.   He has gone to PT/OT.  Still has residual left arm and leg weakness and ambulates with cane.  He has since cut down his  smoking cigarettes to 5 a day.  He no longer uses illicit drugs.    However, he started experiencing numbness in the right leg about a year ago.  It involves the lateral and anterior thigh.  He has shooting pain and spasms along the lateral thigh down to the ankle.  No back pain.  Pain is worse when he stretches the leg.  No associated weakness.  Takes gabapentin 100mg  at bedtime.     PAST MEDICAL HISTORY: Past Medical History:  Diagnosis Date   Chest pain 12/21/2011   Hypertension    Lower back pain     PAST SURGICAL HISTORY: No past surgical history on file.  MEDICATIONS: Current Outpatient Medications on File Prior to Visit  Medication Sig Dispense Refill   acetaminophen (TYLENOL) 325 MG tablet Take 1-2 tablets (325-650 mg total) by mouth every 4 (four) hours as needed for mild pain.     amLODipine (NORVASC) 10 MG tablet Take 1 tablet (10 mg total) by mouth daily. 90 tablet 1   aspirin (ASPIRIN LOW DOSE) 81 MG chewable tablet Chew 1 tablet (81 mg total) by mouth daily. 30 tablet 0   atorvastatin (LIPITOR) 20 MG tablet Take 1 tablet (20 mg total) by mouth daily. 30 tablet 0   Baclofen 5 MG TABS Take 1 tablet (5 mg total) by mouth 3 (three) times daily as needed. 90 tablet 5   carvedilol (COREG) 6.25 MG tablet TAKE 1 TABLET BY MOUTH TWICE A DAY WITH FOOD 180 tablet 1   ergocalciferol (VITAMIN D2) 1.25 MG (50000 UT) capsule Take 1 capsule (50,000 Units total) by mouth once a week.  8 capsule 0   gabapentin (NEURONTIN) 100 MG capsule Take 1 capsule (100 mg total) by mouth at bedtime. 90 capsule 1   irbesartan (AVAPRO) 75 MG tablet TAKE 1 TABLET BY MOUTH EVERY DAY 90 tablet 1   Multiple Vitamins-Minerals (MULTIVITAMIN MEN) TABS Take 1 tablet by mouth daily with breakfast.     traZODone (DESYREL) 100 MG tablet Take 1 tablet (100 mg total) by mouth at bedtime. 90 tablet 1   No current facility-administered medications on file prior to visit.    ALLERGIES: No Known Allergies  FAMILY  HISTORY: Family History  Problem Relation Age of Onset   Cancer Mother    Diabetes Maternal Grandmother    Hypertension Other     Objective:  Blood pressure (!) 156/91, pulse 84, height 6' (1.829 m), weight 259 lb (117.5 kg), SpO2 95%. General: No acute distress.  Patient appears well-groomed.   Head:  Normocephalic/atraumatic Eyes:  fundi examined but not visualized Neck: supple, no paraspinal tenderness, full range of motion Back: right lower paraspinal tenderness Heart: regular rate and rhythm Neurological Exam: Mental status: alert and oriented to person, place, and time, speech fluent and not dysarthric, language intact. Cranial nerves: CN I: not tested CN II: pupils equal, round and reactive to light, visual fields intact CN III, IV, VI:  full range of motion, no nystagmus, no ptosis CN V: facial sensation intact. CN VII: upper and lower face symmetric CN VIII: hearing intact CN IX, X: gag intact, uvula midline CN XI: sternocleidomastoid and trapezius muscles intact CN XII: tongue midline Bulk & Tone: mildly increased on left, no fasciculations. Motor:  muscle strength 5-/5 left deltoid, knee extension and flexion, 4+/5 left biceps/triceps, grip and ankle dorsiflexion.  Reduced finger-thumb tapping speed and amplitude on left.  Otherwise 5/5. Sensation:  Pinprick sensation reduced in anterior right thigh and anterior/lateral leg below knee to ankle as well as reduced in toes bilaterally, vibratory sensation reduced in toes bilaterally Deep Tendon Reflexes:  trace throughout,  toes downgoing.   Finger to nose testing:  Without dysmetria.     Gait:  left hemiparetic gait.  Able to turn. Unable to tandem walk.  Romberg with mild sway.    Thank you for allowing me to take part in the care of this patient.  Shon Millet, DO  CC: Gwinda Passe

## 2023-02-22 ENCOUNTER — Encounter: Payer: Self-pay | Admitting: Neurology

## 2023-02-22 ENCOUNTER — Ambulatory Visit (INDEPENDENT_AMBULATORY_CARE_PROVIDER_SITE_OTHER): Payer: Medicaid Other | Admitting: Neurology

## 2023-02-22 VITALS — BP 156/91 | HR 84 | Ht 72.0 in | Wt 259.0 lb

## 2023-02-22 DIAGNOSIS — M5416 Radiculopathy, lumbar region: Secondary | ICD-10-CM

## 2023-02-22 DIAGNOSIS — I69354 Hemiplegia and hemiparesis following cerebral infarction affecting left non-dominant side: Secondary | ICD-10-CM | POA: Diagnosis not present

## 2023-02-22 DIAGNOSIS — Z720 Tobacco use: Secondary | ICD-10-CM

## 2023-02-22 DIAGNOSIS — E785 Hyperlipidemia, unspecified: Secondary | ICD-10-CM

## 2023-02-22 DIAGNOSIS — I10 Essential (primary) hypertension: Secondary | ICD-10-CM

## 2023-02-22 NOTE — Patient Instructions (Signed)
Continue current management  Will send back to PT/OT for continued therapy of the left sided weakness but also for treatment of right lumbar radiculopathy  Follow up 6 months.

## 2023-03-08 ENCOUNTER — Other Ambulatory Visit: Payer: Self-pay

## 2023-03-08 ENCOUNTER — Ambulatory Visit: Payer: Medicaid Other | Attending: Neurology

## 2023-03-08 VITALS — BP 148/84 | HR 100

## 2023-03-08 DIAGNOSIS — R2681 Unsteadiness on feet: Secondary | ICD-10-CM | POA: Diagnosis present

## 2023-03-08 DIAGNOSIS — R296 Repeated falls: Secondary | ICD-10-CM | POA: Insufficient documentation

## 2023-03-08 DIAGNOSIS — R2689 Other abnormalities of gait and mobility: Secondary | ICD-10-CM | POA: Diagnosis present

## 2023-03-08 DIAGNOSIS — M6281 Muscle weakness (generalized): Secondary | ICD-10-CM | POA: Diagnosis present

## 2023-03-08 DIAGNOSIS — M5416 Radiculopathy, lumbar region: Secondary | ICD-10-CM | POA: Insufficient documentation

## 2023-03-08 DIAGNOSIS — I69354 Hemiplegia and hemiparesis following cerebral infarction affecting left non-dominant side: Secondary | ICD-10-CM | POA: Insufficient documentation

## 2023-03-08 NOTE — Therapy (Addendum)
OUTPATIENT PHYSICAL THERAPY NEURO EVALUATION   Patient Name: Jonathan [name] MRN: [id number] DOB:05/26/1975, 47 y.o., male Today's Date: 03/08/2023   PCP: Gwinda Passe, NP REFERRING PROVIDER: Drema Dallas, DO  END OF SESSION:  PT End of Session - 03/08/23 1321     Visit Number 1    Number of Visits 13    Date for PT Re-Evaluation 05/03/23    Authorization Type [employer] (auth required at 12 th visit after eval)    PT Start Time 1320    PT Stop Time 1400    PT Time Calculation (min) 40 min             Past Medical History:  Diagnosis Date   Chest pain 12/21/2011   Hypertension    Insomnia    Lower back pain    History reviewed. No pertinent surgical history. Patient Active Problem List   Diagnosis Date Noted   Left spastic hemiparesis (HCC) 08/05/2022   HTN (hypertension) 07/08/2022   Acute renal failure superimposed on chronic kidney disease (HCC) 07/08/2022   Elevated lipids 07/08/2022   Right basal ganglia embolic stroke (HCC) 07/04/2022   Acute ischemic stroke (HCC) 06/30/2022   Noncompliance with medication regimen 06/30/2022   Essential hypertension 11/03/2021   Tobacco abuse 12/21/2011   Alcoholism (HCC) 12/21/2011   VERTIGO NOS OR DIZZINESS 07/13/2006    ONSET DATE: 02/22/2023  REFERRING DIAG: L24.401 (ICD-10-CM) - Hemiparesis affecting left side as late effect of cerebrovascular accident (HCC) M54.16 (ICD-10-CM) - Right lumbar radiculopathy  THERAPY DIAG:  Muscle weakness (generalized) - Plan: PT plan of care cert/re-cert  Hemiplegia and hemiparesis following cerebral infarction affecting left non-dominant side (HCC) - Plan: PT plan of care cert/re-cert  Other abnormalities of gait and mobility - Plan: PT plan of care cert/re-cert  Unsteadiness on feet - Plan: PT plan of care cert/re-cert  Rationale for Evaluation and Treatment: Rehabilitation  SUBJECTIVE:                                                                                                                                                                                              SUBJECTIVE STATEMENT: 47 y.o. male with of untreated hypertension and tobacco use who was admitted on 06/30/2022 with left arm and left leg weakness with that started the night before. Pt was here short time earlier in the time but due to insurance issues, he had to stop coming. Pt reports that he uses cane intermittently. Pt is also reporting of strong headaches at home for past month. Pt accompanied by: self  PERTINENT HISTORY: Hx of CVA (06/2022), HTN  PAIN:  Are you having pain? Yes: NPRS scale: 7/10 Pain location: L latera leg that radiates to lower leg which occurs intermittently and lasts for 4-5 min and then it goes away Pain description: radiating, intermittent Aggravating factors: Walking Relieving factors: rest  PRECAUTIONS: Fall  RED FLAGS: None   WEIGHT BEARING RESTRICTIONS: No  FALLS: Has patient fallen in last 6 months? Yes. Number of falls 7-8  LIVING ENVIRONMENT: Lives with:  fiancee and 2 young daughters Lives in: House/apartment Stairs: Yes: External: 4 steps; none Has following equipment at home: Single point cane  PLOF:  needs assistance with putting on shoes, putting on clothes, cooking cleaning.  PATIENT GOALS: "Try to get my strength back"  OBJECTIVE:  Note: Objective measures were completed at Evaluation unless otherwise noted.  DIAGNOSTIC FINDINGS: 06/30/22  IMPRESSION: Small acute infarct in the descending white matter tracks of the right basal ganglia and overlying frontal lobe.  COGNITION: Overall cognitive status: Within functional limits for tasks assessed LOWER EXTREMITY ROM:     Active  Right Eval Left Eval  Hip flexion    Hip extension    Hip abduction    Hip adduction    Hip internal rotation    Hip external rotation    Knee flexion    Knee extension    Ankle dorsiflexion    Ankle plantarflexion    Ankle  inversion    Ankle eversion     (Blank rows = not tested)  LOWER EXTREMITY MMT:    MMT Right Eval Left Eval  Hip flexion    Hip extension    Hip abduction    Hip adduction    Hip internal rotation    Hip external rotation    Knee flexion    Knee extension    Ankle dorsiflexion    Ankle plantarflexion    Ankle inversion    Ankle eversion    (Blank rows = not tested)  GAIT: Gait pattern: decreased arm swing- Right, decreased arm swing- Left, decreased step length- Right, decreased step length- Left, decreased stance time- Left, decreased stride length, decreased hip/knee flexion- Left, decreased ankle dorsiflexion- Left, circumduction- Left, knee flexed in stance- Left, lateral hip instability, lateral lean- Right, lateral lean- Left, decreased trunk rotation, and poor foot clearance- Left Distance walked: 460' total Assistive device utilized: None Level of assistance: CGA   FUNCTIONAL TESTS:  10 meter walk test: 0.63 m/s Functional gait assessment: 7/30    TODAY'S TREATMENT:                                                                                                                              DATE:    Vitals:   03/08/23 1338  BP: (!) 148/84  Pulse: 100   Pt educated on importance of keeping BP as close to normal as possible. We discussed measuring it 3x/day. Provided BP log to patient. Educated patient headaches may be due to high BP.   We discussed  benefits of tobacco cessation .  Pt educated on healing time frames after stroke and emphasis placed on stimulating L UE and L LE with activities at home while managing safety by using appropriate AD to reduce fall risk.  Pt educated on importance of using his cane at all the time, to maintain balance and reduce risk for fall.  Gait training: 1 x 115' with Ground reaction AFO on L foot, noted improved cadence, improved foot clearance and improved knee extension in L LE Gait training: 1 x 115' with foot up brace in  L foot. Improved foot clearance  Pt given printed resources for BP log to be measured 3x/day and AFO to purchase from [employer].    PATIENT EDUCATION: Education details: see above Person educated: Patient Education method: Medical illustrator Education comprehension: verbalized understanding  HOME EXERCISE PROGRAM: 03/08/23: Walking program: start walking with cane and family member for 6' and progress to 20-30' of walking per day gradually over time.   GOALS: Goals reviewed with patient? Yes  SHORT TERM GOALS: Target date: 04/05/2023    Patient will demo 50% compliance with walking program at home with family member Baseline: Goal status: INITIAL  2. Patient will be compliant with using st. Cane for his mobility 100% of the time.  Baseline: not fully compliant with cane  Goal status: Initial   LONG TERM GOALS: Target date: 05/03/2023    Patient will demo gait speed of >0.80 m/s without AD to improve walking speed and reduce fall risk Baseline:  Goal status: INITIAL  2.  Pt will demo FGA score of >20/30 to reduce fall risk. Baseline: 7/30 (03/08/23) Goal status: INITIAL  3.  Pt will be able to navigate steps up and down with st. Point cane and without rail to be able to go in and out of house at home Independently. Baseline: requires family member's assistance (03/08/23) Goal status: INITIAL   ASSESSMENT:  CLINICAL IMPRESSION: Patient is a 47 y.o. male who was seen today for physical therapy evaluation and treatment for gait and mobility disorder due to CVA in 06/2022. Patient currently has significant history of falls within last 6 months. Patient demonstrated increased risk for fall with Functional gait assessment score of 7/30. Patient will benefit from skilled PT to improve his overall gait, balance and mobility to reduce risk of falls, improve safety awareness, improve function and endurance..   OBJECTIVE IMPAIRMENTS: Abnormal gait, decreased activity  tolerance, decreased balance, decreased coordination, decreased endurance, decreased mobility, difficulty walking, decreased ROM, decreased strength, decreased safety awareness, hypomobility, increased muscle spasms, impaired flexibility, impaired sensation, impaired tone, impaired UE functional use, improper body mechanics, postural dysfunction, and pain.   ACTIVITY LIMITATIONS: carrying, lifting, bending, standing, squatting, stairs, transfers, and dressing  PARTICIPATION LIMITATIONS: meal prep, cleaning, driving, shopping, community activity, and yard work  PERSONAL FACTORS: Time since onset of injury/illness/exacerbation are also affecting patient's functional outcome.   REHAB POTENTIAL: Good  CLINICAL DECISION MAKING: Stable/uncomplicated  EVALUATION COMPLEXITY: Low  PLAN:  PT FREQUENCY: 2x/week  PT DURATION: 8 weeks  PLANNED INTERVENTIONS: 97164- PT Re-evaluation, 97110-Therapeutic exercises, 97530- Therapeutic activity, 97112- Neuromuscular re-education, 97535- Self Care, 29562- Manual therapy, [phone]- Gait training, Patient/Family education, Balance training, Stair training, Joint mobilization, Cryotherapy, and Moist heat  PLAN FOR NEXT SESSION: Check on  AFO order, continue with gait and balance, try Ground reaction AFO on L foot with gait.   Ileana Ladd, PT 03/08/2023, 2:59 PM

## 2023-03-09 ENCOUNTER — Telehealth: Payer: Self-pay

## 2023-03-09 DIAGNOSIS — I639 Cerebral infarction, unspecified: Secondary | ICD-10-CM

## 2023-03-09 NOTE — Telephone Encounter (Signed)
-----   Message from Cira Servant sent at 03/08/2023  4:23 PM EDT ----- Regarding: FW: Need AFO order Donel Osowski, please send order for left AFO as requested below. Thanks ----- Message ----- From: Ileana Ladd, PT Sent: 03/08/2023   2:57 PM EDT To: Drema Dallas, DO Subject: Need AFO order                                 Good afternoon Dr. Everlena Cooper,  I had a pleasure of seeing Mr. Dirienzo for physical therapy evaluation today. Based on my assessment, I think he will really benefit from Ground reaction (AFO) to improve his foot clearance on his L LE and improve L knee stability during gait.  May I ask you to place an order for "L AFO" with CVA diagnosis, in the workqueue for [facility], please?  Once we receive the order, we will pass it on to [facility] for ordering.  Thank you for referring Mr. Goodine to Neuro Rehab.  Lavone Nian, PT

## 2023-03-10 ENCOUNTER — Ambulatory Visit: Payer: Medicaid Other | Admitting: Occupational Therapy

## 2023-03-10 ENCOUNTER — Ambulatory Visit: Payer: Medicaid Other | Admitting: Physical Therapy

## 2023-03-10 VITALS — BP 172/95 | HR 88

## 2023-03-10 DIAGNOSIS — I69354 Hemiplegia and hemiparesis following cerebral infarction affecting left non-dominant side: Secondary | ICD-10-CM

## 2023-03-10 DIAGNOSIS — R296 Repeated falls: Secondary | ICD-10-CM

## 2023-03-10 DIAGNOSIS — M5416 Radiculopathy, lumbar region: Secondary | ICD-10-CM

## 2023-03-10 DIAGNOSIS — M6281 Muscle weakness (generalized): Secondary | ICD-10-CM

## 2023-03-10 DIAGNOSIS — R2681 Unsteadiness on feet: Secondary | ICD-10-CM

## 2023-03-10 DIAGNOSIS — R2689 Other abnormalities of gait and mobility: Secondary | ICD-10-CM

## 2023-03-10 NOTE — Therapy (Signed)
OUTPATIENT OCCUPATIONAL THERAPY Arrived, No charge  Patient Name: CARVIN WILDASIN MRN: [id number] DOB:10-16-1975, 47 y.o., male Today's Date: 03/10/2023  PCP: Grayce Sessions, NP  REFERRING PROVIDER: Drema Dallas, DO     OT End of Session - 03/10/23 1349     Visit Number 0    OT Start Time 1337    OT Stop Time 1338    OT Time Calculation (min) 1 min    Behavior During Therapy [facility] for tasks assessed/performed            Pt arrived for PT session then OT eval today. However, per PT report, pt demo'd high BP during PT session and therefore PT session ended early (see PT note for BP). OT noted BP to be outside therapeutic parameters for OT session as well. In addition, pt reported that he needed to leave at 2:15 PM, before scheduled end to OT evaluation time. Therefore, OT and pt discussed rescheduling OT eval to later date d/t time constraints and high BP. Pt verbalized understanding and agreed with rescheduling OT eval.  Wynetta Emery, OT 03/10/2023, 1:49 PM

## 2023-03-10 NOTE — Therapy (Signed)
OUTPATIENT PHYSICAL THERAPY NEURO TREATMENT   Patient Name: Jonathan [name] MRN: [id number] DOB:1975/09/04, 47 y.o., male Today's Date: 03/10/2023   PCP: Gwinda Passe, NP REFERRING PROVIDER: Drema Dallas, DO  END OF SESSION:  PT End of Session - 03/10/23 1331     Visit Number 1   Arrived no charge   Number of Visits 13    Date for PT Re-Evaluation 05/03/23    Authorization Type [employer] (auth required at 12 th visit after eval)    PT Start Time 1330   Pt arrived late   PT Stop Time 1345   Arrived no charge   PT Time Calculation (min) 15 min    Activity Tolerance Treatment limited secondary to medical complications (Comment)   HTN   Behavior During Therapy [facility] for tasks assessed/performed             Past Medical History:  Diagnosis Date   Chest pain 12/21/2011   Hypertension    Insomnia    Lower back pain    No past surgical history on file. Patient Active Problem List   Diagnosis Date Noted   Left spastic hemiparesis (HCC) 08/05/2022   HTN (hypertension) 07/08/2022   Acute renal failure superimposed on chronic kidney disease (HCC) 07/08/2022   Elevated lipids 07/08/2022   Right basal ganglia embolic stroke (HCC) 07/04/2022   Acute ischemic stroke (HCC) 06/30/2022   Noncompliance with medication regimen 06/30/2022   Essential hypertension 11/03/2021   Tobacco abuse 12/21/2011   Alcoholism (HCC) 12/21/2011   VERTIGO NOS OR DIZZINESS 07/13/2006    ONSET DATE: 02/22/2023  REFERRING DIAG: N82.956 (ICD-10-CM) - Hemiparesis affecting left side as late effect of cerebrovascular accident (HCC) M54.16 (ICD-10-CM) - Right lumbar radiculopathy  THERAPY DIAG:  Hemiplegia and hemiparesis following cerebral infarction affecting left non-dominant side (HCC)  Other abnormalities of gait and mobility  Unsteadiness on feet  Muscle weakness (generalized)  Repeated falls  Rationale for Evaluation and Treatment: Rehabilitation  SUBJECTIVE:                                                                                                                                                                                              SUBJECTIVE STATEMENT: Pt presents w/SPC. States he has not been using his cane at home. Did not check his BP today.   Pt accompanied by: self  PERTINENT HISTORY: Hx of CVA (06/2022), HTN  PAIN:  Are you having pain? No  PRECAUTIONS: Fall  RED FLAGS: None   WEIGHT BEARING RESTRICTIONS: No  FALLS: Has patient fallen in last 6 months? Yes. Number of falls 7-8  LIVING ENVIRONMENT: Lives with:  fiancee and 2 young daughters Lives in: House/apartment Stairs: Yes: External: 4 steps; none Has following equipment at home: Single point cane  PLOF:  needs assistance with putting on shoes, putting on clothes, cooking cleaning.  PATIENT GOALS: "Try to get my strength back"  OBJECTIVE:  Note: Objective measures were completed at Evaluation unless otherwise noted.  DIAGNOSTIC FINDINGS: 06/30/22  IMPRESSION: Small acute infarct in the descending white matter tracks of the right basal ganglia and overlying frontal lobe.  COGNITION: Overall cognitive status: Within functional limits for tasks assessed LOWER EXTREMITY ROM:     Active  Right Eval Left Eval  Hip flexion    Hip extension    Hip abduction    Hip adduction    Hip internal rotation    Hip external rotation    Knee flexion    Knee extension    Ankle dorsiflexion    Ankle plantarflexion    Ankle inversion    Ankle eversion     (Blank rows = not tested)  LOWER EXTREMITY MMT:    MMT Right Eval Left Eval  Hip flexion    Hip extension    Hip abduction    Hip adduction    Hip internal rotation    Hip external rotation    Knee flexion    Knee extension    Ankle dorsiflexion    Ankle plantarflexion    Ankle inversion    Ankle eversion    (Blank rows = not tested)  VITALS  Vitals:   03/10/23 1333 03/10/23 1336  BP: (!)  167/102 (!) 172/95  Pulse: 91 88     TODAY'S TREATMENT:     Arrived no charge Assessed vitals (see above) and BP too elevated to participate in PT. Pt reports he needs to leave at 2:15 today as well, so cannot stay for OT eval. Provided pt w/printout of HEP from previous POC and sent pt to front to schedule PT treatments and OT eval.    GAIT: Gait pattern: decreased arm swing- Right, decreased arm swing- Left, decreased step length- Right, decreased step length- Left, decreased stance time- Left, decreased stride length, decreased hip/knee flexion- Left, decreased ankle dorsiflexion- Left, circumduction- Left, knee flexed in stance- Left, lateral hip instability, lateral lean- Right, lateral lean- Left, decreased trunk rotation, and poor foot clearance- Left Distance walked: Various clinic distances  Assistive device utilized: Single point cane Level of assistance: SBA     PATIENT EDUCATION: Education details: see above Person educated: Patient Education method: Medical illustrator Education comprehension: verbalized understanding  HOME EXERCISE PROGRAM: 03/08/23: Walking program: start walking with cane and family member for 6' and progress to 20-30' of walking per day gradually over time.   GOALS: Goals reviewed with patient? Yes  SHORT TERM GOALS: Target date: 04/05/2023    Patient will demo 50% compliance with walking program at home with family member Baseline: Goal status: INITIAL  2. Patient will be compliant with using st. Cane for his mobility 100% of the time.  Baseline: not fully compliant with cane  Goal status: Initial   LONG TERM GOALS: Target date: 05/03/2023    Patient will demo gait speed of >0.80 m/s without AD to improve walking speed and reduce fall risk Baseline:  Goal status: INITIAL  2.  Pt will demo FGA score of >20/30 to reduce fall risk. Baseline: 7/30 (03/08/23) Goal status: INITIAL  3.  Pt will be able to navigate steps up  and down  with st[place] cane and without rail to be able to go in and out of house at home Independently. Baseline: requires family member's assistance (03/08/23) Goal status: INITIAL   ASSESSMENT:  CLINICAL IMPRESSION: Arrived no charge due to HTN.   OBJECTIVE IMPAIRMENTS: Abnormal gait, decreased activity tolerance, decreased balance, decreased coordination, decreased endurance, decreased mobility, difficulty walking, decreased ROM, decreased strength, decreased safety awareness, hypomobility, increased muscle spasms, impaired flexibility, impaired sensation, impaired tone, impaired UE functional use, improper body mechanics, postural dysfunction, and pain.   ACTIVITY LIMITATIONS: carrying, lifting, bending, standing, squatting, stairs, transfers, and dressing  PARTICIPATION LIMITATIONS: meal prep, cleaning, driving, shopping, community activity, and yard work  PERSONAL FACTORS: Time since onset of injury/illness/exacerbation are also affecting patient's functional outcome.   REHAB POTENTIAL: Good  CLINICAL DECISION MAKING: Stable/uncomplicated  EVALUATION COMPLEXITY: Low  PLAN:  PT FREQUENCY: 2x/week  PT DURATION: 8 weeks  PLANNED INTERVENTIONS: 97164- PT Re-evaluation, 97110-Therapeutic exercises, 97530- Therapeutic activity, 97112- Neuromuscular re-education, 97535- Self Care, 40981- Manual therapy, [phone]- Gait training, Patient/Family education, Balance training, Stair training, Joint mobilization, Cryotherapy, and Moist heat  PLAN FOR NEXT SESSION: Check on  AFO order, continue with gait and balance, try Ground reaction AFO on L foot with gait.   Bluford Sedler E Karsyn Jamie, PT 03/10/2023, 1:48 PM

## 2023-03-16 ENCOUNTER — Ambulatory Visit: Payer: Medicaid Other | Admitting: Occupational Therapy

## 2023-03-16 ENCOUNTER — Ambulatory Visit: Payer: Medicaid Other | Admitting: Physical Therapy

## 2023-03-16 NOTE — Therapy (Deleted)
OUTPATIENT OCCUPATIONAL THERAPY NEURO EVALUATION  Patient Name: Jonathan [name] MRN: [id number] DOB:[birth year], 47 y.o., male Today's Date: 03/16/2023  PCP: Grayce Sessions, NP  REFERRING PROVIDER: Drema Dallas, DO  END OF SESSION:   Past Medical History:  Diagnosis Date   Chest pain 12/21/2011   Hypertension    Insomnia    Lower back pain    No past surgical history on file. Patient Active Problem List   Diagnosis Date Noted   Left spastic hemiparesis (HCC) 08/05/2022   HTN (hypertension) 07/08/2022   Acute renal failure superimposed on chronic kidney disease (HCC) 07/08/2022   Elevated lipids 07/08/2022   Right basal ganglia embolic stroke (HCC) 07/04/2022   Acute ischemic stroke (HCC) 06/30/2022   Noncompliance with medication regimen 06/30/2022   Essential hypertension 11/03/2021   Tobacco abuse 12/21/2011   Alcoholism (HCC) 12/21/2011   VERTIGO NOS OR DIZZINESS 07/13/2006    ONSET DATE: 02/22/23 (referral date)  REFERRING DIAG: Y86.578 (ICD-10-CM) - Hemiparesis affecting left side as late effect of cerebrovascular accident (HCC)   THERAPY DIAG:  No diagnosis found.  Rationale for Evaluation and Treatment: Rehabilitation  SUBJECTIVE:   SUBJECTIVE STATEMENT: *** Pt accompanied by: {accompnied:27141}  PERTINENT HISTORY: Hx of CVA (06/2022), HTN.   Pt previously received OT earlier this year. Per 08/31/22 OT Treatment Note: "47 y.o. male with of untreated hypertension and tobacco use who was admitted on 06/30/2022 with left arm and left leg weakness with that started the night before.  CTA head/neck was negative for LVO.  UDS was positive for cocaine.  MRI brain showed small acute infarct in white matter tracts in right basal ganglia and overlying frontal lobe.  Dr. Pearlean Brownie felt the stroke was due to small vessel disease and cocaine use."   Per 03/08/23 PT eval: "Pt was here short time earlier in the time but due to insurance issues, he had to stop  coming. Pt reports that he uses cane intermittently. Pt is also reporting of strong headaches at home for past month."   PRECAUTIONS: {Therapy precautions:24002}  WEIGHT BEARING RESTRICTIONS: {Yes ***/No:24003}  PAIN:  Are you having pain? {OPRCPAIN:27236}  FALLS: Has patient fallen in last 6 months? Yes. Number of falls 7-8  ***  LIVING ENVIRONMENT: *** Lives with:  fiancee and 2 young daughters Lives in: House/apartment Stairs: Yes: External: 4 steps; none Has following equipment at home: Single point cane  PLOF:  needs assistance with putting on shoes, putting on clothes, cooking cleaning.  ***  PATIENT GOALS: ***  OBJECTIVE:  Note: Objective measures were completed at Evaluation unless otherwise noted.  HAND DOMINANCE: {MISC; OT HAND DOMINANCE:[phone]}  ADLs: Overall ADLs: *** Transfers/ambulation related to ADLs: Eating: *** Grooming: *** UB Dressing: *** LB Dressing: *** Toileting: *** Bathing: *** Tub Shower transfers: *** Equipment: {equipment:25573}  IADLs: Shopping: *** Light housekeeping: *** Meal Prep: *** Community mobility: *** Medication management: *** Financial management: *** Handwriting: {OTWRITTENEXPRESSION:25361}  MOBILITY STATUS: {OTMOBILITY:25360}  POSTURE COMMENTS:  {posture:25561} Sitting balance: {sitting balance:25483}  ACTIVITY TOLERANCE: Activity tolerance: ***  FUNCTIONAL OUTCOME MEASURES: {OTFUNCTIONALMEASURES:27238}  UPPER EXTREMITY ROM:    {AROM/PROM:27142} ROM Right eval Left eval  Shoulder flexion    Shoulder abduction    Shoulder adduction    Shoulder extension    Shoulder internal rotation    Shoulder external rotation    Elbow flexion    Elbow extension    Wrist flexion    Wrist extension    Wrist ulnar deviation    Wrist radial  deviation    Wrist pronation    Wrist supination    (Blank rows = not tested)  UPPER EXTREMITY MMT:     MMT Right eval Left eval  Shoulder flexion    Shoulder  abduction    Shoulder adduction    Shoulder extension    Shoulder internal rotation    Shoulder external rotation    Middle trapezius    Lower trapezius    Elbow flexion    Elbow extension    Wrist flexion    Wrist extension    Wrist ulnar deviation    Wrist radial deviation    Wrist pronation    Wrist supination    (Blank rows = not tested)  HAND FUNCTION: {handfunction:27230}  COORDINATION: {otcoordination:27237}  SENSATION: {sensation:27233}  EDEMA: ***  MUSCLE TONE: {UETONE:25567}  COGNITION: Overall cognitive status: {cognition:24006}  VISION: Subjective report: *** Baseline vision: {OTBASELINEVISION:25363} Visual history: {OTVISUALHISTORY:25364}  VISION ASSESSMENT: {visionassessment:27231}  Patient has difficulty with following activities due to following visual impairments: ***  PERCEPTION: {Perception:25564}  PRAXIS: {Praxis:25565}  OBSERVATIONS: ***   TODAY'S TREATMENT:                                                                                                                              DATE: ***   PATIENT EDUCATION: Education details: *** Person educated: {Person educated:25204} Education method: {Education Method:25205} Education comprehension: {Education Comprehension:25206}  HOME EXERCISE PROGRAM: ***   GOALS: Goals reviewed with patient? {yes/no:20286}  SHORT TERM GOALS: Target date: ***  *** Baseline: Goal status: INITIAL  2.  *** Baseline:  Goal status: INITIAL  3.  *** Baseline:  Goal status: INITIAL  4.  *** Baseline:  Goal status: INITIAL  5.  *** Baseline:  Goal status: INITIAL  6.  *** Baseline:  Goal status: INITIAL  LONG TERM GOALS: Target date: ***  *** Baseline:  Goal status: INITIAL  2.  *** Baseline:  Goal status: INITIAL  3.  *** Baseline:  Goal status: INITIAL  4.  *** Baseline:  Goal status: INITIAL  5.  *** Baseline:  Goal status: INITIAL  6.  *** Baseline:  Goal  status: INITIAL  ASSESSMENT:  CLINICAL IMPRESSION: Patient is a *** y.o. *** who was seen today for occupational therapy evaluation for ***.   PERFORMANCE DEFICITS: in functional skills including {OT physical skills:25468}, cognitive skills including {OT cognitive skills:25469}, and psychosocial skills including {OT psychosocial skills:25470}.   IMPAIRMENTS: are limiting patient from {OT performance deficits:25471}.   CO-MORBIDITIES: {Comorbidities:25485} that affects occupational performance. Patient will benefit from skilled OT to address above impairments and improve overall function.  MODIFICATION OR ASSISTANCE TO COMPLETE EVALUATION: {OT modification:25474}  OT OCCUPATIONAL PROFILE AND HISTORY: {OT PROFILE AND HISTORY:25484}  CLINICAL DECISION MAKING: {OT CDM:25475}  REHAB POTENTIAL: {rehabpotential:25112}  EVALUATION COMPLEXITY: {Evaluation complexity:25115}    PLAN:  OT FREQUENCY: {rehab frequency:25116}  OT DURATION: {rehab duration:25117}  PLANNED INTERVENTIONS: {OT Interventions:25467}  RECOMMENDED OTHER SERVICES: ***  CONSULTED AND AGREED WITH PLAN OF CARE: {JXB:14782}  PLAN FOR NEXT SESSION: ***   Wynetta Emery, OT 03/16/2023, 7:51 AM

## 2023-03-20 ENCOUNTER — Telehealth (INDEPENDENT_AMBULATORY_CARE_PROVIDER_SITE_OTHER): Payer: Self-pay | Admitting: Primary Care

## 2023-03-20 NOTE — Telephone Encounter (Signed)
Called pt to confirm apt. No answer and could not leave VM

## 2023-03-21 ENCOUNTER — Ambulatory Visit (INDEPENDENT_AMBULATORY_CARE_PROVIDER_SITE_OTHER): Payer: Medicaid Other | Admitting: Primary Care

## 2023-03-21 ENCOUNTER — Encounter (INDEPENDENT_AMBULATORY_CARE_PROVIDER_SITE_OTHER): Payer: Self-pay | Admitting: Primary Care

## 2023-03-21 VITALS — BP 141/95 | HR 87 | Resp 16 | Wt 254.8 lb

## 2023-03-21 DIAGNOSIS — Z2821 Immunization not carried out because of patient refusal: Secondary | ICD-10-CM

## 2023-03-21 DIAGNOSIS — G4709 Other insomnia: Secondary | ICD-10-CM | POA: Diagnosis not present

## 2023-03-21 DIAGNOSIS — I10 Essential (primary) hypertension: Secondary | ICD-10-CM

## 2023-03-21 DIAGNOSIS — Z1211 Encounter for screening for malignant neoplasm of colon: Secondary | ICD-10-CM

## 2023-03-21 MED ORDER — TRAZODONE HCL 100 MG PO TABS: 100.0000 mg | ORAL_TABLET | Freq: Every day | ORAL | 1 refills | Status: AC

## 2023-03-21 MED ORDER — AMLODIPINE BESYLATE 10 MG PO TABS: 10.0000 mg | ORAL_TABLET | Freq: Every day | ORAL | 1 refills | Status: DC

## 2023-03-21 MED ORDER — CARVEDILOL 6.25 MG PO TABS: 6.2500 mg | ORAL_TABLET | Freq: Two times a day (BID) | ORAL | 1 refills | Status: DC

## 2023-03-21 MED ORDER — IRBESARTAN 75 MG PO TABS: 75.0000 mg | ORAL_TABLET | Freq: Every day | ORAL | 1 refills | Status: DC

## 2023-03-21 NOTE — Progress Notes (Signed)
Acute Office Visit  Subjective:     Patient ID: Jonathan [name], male    DOB: 1976-04-12, 47 y.o.   MRN: [id number]  Chief Complaint  Patient presents with   Hypertension   Headache   Subjective:  Jonathan [name] is a 47 y.o. male who presents for evaluation of headache. Symptoms began about 1 month ago. Generally, the headaches last about 1 hour and occur intermitted . The headaches do not seem to be related to any time of the day. The headaches are usually sharp and are located in right temporal area .  The patient rates his most severe headaches a 8 on a scale from 1 to 10. Recently, the headaches have been increasing in frequency. . Precipitating factors include: . The headaches are usually not preceded by an aura. Associated neurologic symptoms: depression, loss of balance, and vision problems. The patient denies decreased physical activity, numbness of extremities, and vomiting in the early morning. Home treatment has included Goodie power with some improvement. Other history includes: mental health issues . Family history includes mother and sister known family members with significant headaches.  The following portions of the patient's history were reviewed and updated as appropriate: allergies, current medications, past family history, past medical history, past social history, past surgical history, and problem list.  Review of Systems Pertinent items noted in HPI and remainder of comprehensive ROS otherwise negative.   Objective:  BP (!) 141/95 (BP Location: Right Arm, Patient Position: Sitting, Cuff Size: Large)   Pulse 87   Resp 16   Wt 254 lb 12.8 oz (115.6 kg)   SpO2 99%   BMI 34.56 kg/m  General appearance: alert, appears stated age, and mild distress Neck: no adenopathy, no carotid bruit, no JVD, supple, symmetrical, trachea midline, and thyroid not enlarged, symmetric, no tenderness/mass/nodules Lungs: clear to auscultation bilaterally Skin: Skin color,  texture, turgor normal. No rashes or lesions Neurologic: Alert and oriented X 3, normal strength and tone. Normal symmetric reflexes. Normal coordination and gait      Lie in darkened room and apply cold packs as needed for pain. Side effect profile discussed in detail. Asked to keep headache diary. Patient reassured that neurodiagnostic workup not indicated from benign H&P. Referral to Neurology.   Hypertension This is a chronic problem. The current episode started more than 1 year ago. The problem has been gradually worsening since onset. The problem is uncontrolled. Associated symptoms include anxiety and headaches. Peripheral edema: question if this was from being off Bipolar medication .Marland KitchenRisk factors for coronary artery disease include obesity, male gender and dyslipidemia.  Headache  His past medical history is significant for hypertension.     Review of Systems  Neurological:  Positive for headaches.        Objective:    BP (!) 141/95 (BP Location: Right Arm, Patient Position: Sitting, Cuff Size: Large)   Pulse 87   Resp 16   Wt 254 lb 12.8 oz (115.6 kg)   SpO2 99%   BMI 34.56 kg/m    Physical Exam General: mild apparent distress. Eyes: Extraocular eye movements intact, pupils equal and round. Neck: Supple, trachea midline. Thyroid: No enlargement, mobile without fixation, no tenderness. Cardiovascular: Regular rhythm and rate, no murmur, normal radial pulses. Respiratory: Normal respiratory effort, clear to auscultation. Gastrointestinal: Normal pitch active bowel sounds, nontender abdomen without distention or appreciable hepatomegaly. Musculoskeletal: Normal muscle tone, no tenderness on palpation of tibia, no excessive thoracic kyphosis. Skin: Appropriate warmth, no visible rash.  Mental status: Alert, conversant, speech clear, thought logical, appropriate mood and affect, no hallucinations or delusions evident. Hematologic/lymphatic: No cervical adenopathy,  no visible ecchymoses.       Assessment & Plan:  Jonathan [name]" was seen today for hypertension and headache.  Diagnoses and all orders for this visit:  Influenza vaccination declined  Tetanus, diphtheria, and acellular pertussis (Tdap) vaccination declined  Essential hypertension -     amLODipine (NORVASC) 10 MG tablet; Take 1 tablet (10 mg total) by mouth daily. -     carvedilol (COREG) 6.25 MG tablet; Take 1 tablet (6.25 mg total) by mouth 2 (two) times daily with a meal. -     irbesartan (AVAPRO) 75 MG tablet; Take 1 tablet (75 mg total) by mouth daily.  Colon cancer screening -     Ambulatory referral to Gastroenterology  Other insomnia -     traZODone (DESYREL) 100 MG tablet; Take 1 tablet (100 mg total) by mouth at bedtime.      Grayce Sessions, NP

## 2023-03-24 ENCOUNTER — Ambulatory Visit: Payer: Medicaid Other | Attending: Primary Care | Admitting: Physical Therapy

## 2023-03-24 ENCOUNTER — Ambulatory Visit: Payer: Medicaid Other | Admitting: Occupational Therapy

## 2023-03-24 ENCOUNTER — Other Ambulatory Visit: Payer: Self-pay

## 2023-03-24 VITALS — BP 166/96 | HR 89

## 2023-03-24 DIAGNOSIS — M6281 Muscle weakness (generalized): Secondary | ICD-10-CM | POA: Insufficient documentation

## 2023-03-24 DIAGNOSIS — I69354 Hemiplegia and hemiparesis following cerebral infarction affecting left non-dominant side: Secondary | ICD-10-CM | POA: Insufficient documentation

## 2023-03-24 DIAGNOSIS — R29818 Other symptoms and signs involving the nervous system: Secondary | ICD-10-CM | POA: Insufficient documentation

## 2023-03-24 DIAGNOSIS — R296 Repeated falls: Secondary | ICD-10-CM | POA: Insufficient documentation

## 2023-03-24 DIAGNOSIS — R2689 Other abnormalities of gait and mobility: Secondary | ICD-10-CM | POA: Diagnosis present

## 2023-03-24 DIAGNOSIS — R278 Other lack of coordination: Secondary | ICD-10-CM | POA: Insufficient documentation

## 2023-03-24 DIAGNOSIS — R41842 Visuospatial deficit: Secondary | ICD-10-CM | POA: Diagnosis present

## 2023-03-24 DIAGNOSIS — R29898 Other symptoms and signs involving the musculoskeletal system: Secondary | ICD-10-CM | POA: Insufficient documentation

## 2023-03-24 DIAGNOSIS — R2681 Unsteadiness on feet: Secondary | ICD-10-CM | POA: Diagnosis present

## 2023-03-24 NOTE — Therapy (Signed)
OUTPATIENT PHYSICAL THERAPY NEURO TREATMENT   Patient Name: Jonathan [name] MRN: [id number] DOB:07-19-1975, 47 y.o., male Today's Date: 03/24/2023   PCP: Gwinda Passe, NP REFERRING PROVIDER: Drema Dallas, DO  END OF SESSION:  PT End of Session - 03/24/23 0935     Visit Number 2    Number of Visits 13    Date for PT Re-Evaluation 05/03/23    Authorization Type [employer] (auth required at 12 th visit after eval)    PT Start Time 0934    PT Stop Time 1015    PT Time Calculation (min) 41 min    Equipment Utilized During Treatment Gait belt    Activity Tolerance Patient tolerated treatment well;Patient limited by fatigue    Behavior During Therapy [facility] for tasks assessed/performed              Past Medical History:  Diagnosis Date   Chest pain 12/21/2011   Hypertension    Insomnia    Lower back pain    No past surgical history on file. Patient Active Problem List   Diagnosis Date Noted   Left spastic hemiparesis (HCC) 08/05/2022   HTN (hypertension) 07/08/2022   Acute renal failure superimposed on chronic kidney disease (HCC) 07/08/2022   Elevated lipids 07/08/2022   Right basal ganglia embolic stroke (HCC) 07/04/2022   Acute ischemic stroke (HCC) 06/30/2022   Noncompliance with medication regimen 06/30/2022   Essential hypertension 11/03/2021   Tobacco abuse 12/21/2011   Alcoholism (HCC) 12/21/2011   VERTIGO NOS OR DIZZINESS 07/13/2006    ONSET DATE: 02/22/2023  REFERRING DIAG: B01.751 (ICD-10-CM) - Hemiparesis affecting left side as late effect of cerebrovascular accident (HCC) M54.16 (ICD-10-CM) - Right lumbar radiculopathy  THERAPY DIAG:  Hemiplegia and hemiparesis following cerebral infarction affecting left non-dominant side (HCC)  Other abnormalities of gait and mobility  Unsteadiness on feet  Muscle weakness (generalized)  Rationale for Evaluation and Treatment: Rehabilitation  SUBJECTIVE:                                                                                                                                                                                              SUBJECTIVE STATEMENT: Pt presents w/SPC. States he has been using his cane at home. Denies falls. States he has been having a lot of cramping in his left leg and right arm. Does not prevent him from sleeping but is annoying. Headaches are about the same.   Pt accompanied by: self  PERTINENT HISTORY: Hx of CVA (06/2022), HTN  PAIN:  Are you having pain? No  PRECAUTIONS: Fall  RED FLAGS: None   WEIGHT  BEARING RESTRICTIONS: No  FALLS: Has patient fallen in last 6 months? Yes. Number of falls 7-8  LIVING ENVIRONMENT: Lives with:  fiancee and 2 young daughters Lives in: House/apartment Stairs: Yes: External: 4 steps; none Has following equipment at home: Single point cane  PLOF:  needs assistance with putting on shoes, putting on clothes, cooking cleaning.  PATIENT GOALS: "Try to get my strength back"  OBJECTIVE:  Note: Objective measures were completed at Evaluation unless otherwise noted.  DIAGNOSTIC FINDINGS: 06/30/22  IMPRESSION: Small acute infarct in the descending white matter tracks of the right basal ganglia and overlying frontal lobe.  COGNITION: Overall cognitive status: Within functional limits for tasks assessed LOWER EXTREMITY ROM:     Active  Right Eval Left Eval  Hip flexion    Hip extension    Hip abduction    Hip adduction    Hip internal rotation    Hip external rotation    Knee flexion    Knee extension    Ankle dorsiflexion    Ankle plantarflexion    Ankle inversion    Ankle eversion     (Blank rows = not tested)  LOWER EXTREMITY MMT:    MMT Right Eval Left Eval  Hip flexion    Hip extension    Hip abduction    Hip adduction    Hip internal rotation    Hip external rotation    Knee flexion    Knee extension    Ankle dorsiflexion    Ankle plantarflexion    Ankle inversion     Ankle eversion    (Blank rows = not tested)  VITALS  Vitals:   03/24/23 0939  BP: (!) 166/96  Pulse: 89      TODAY'S TREATMENT:     Ther Act  Assessed vitals (see above) and BP elevated but narrowly within range for therapy   Ther Ex  Reviewed HEP for improved BLE strength and safety at home:   - Side Stepping with Resistance at Thighs and Counter Support (green band). Min cues for increased step clearance w/movement and to facilitate increased eccentric control against band.  Actor Walk with Resistance at [employer] and [employer]  (green band). Min cues for wide BOS  - Backward Monster Walk with Resistance at [employer] and Counter Support  (green band). Min cues for wide BOS.  - Standing 3-Way Leg Reach with Resistance at Ankles and Counter Support  (green band). Pt performed 2x10 of each direction w/min multimodal cues to avoid trunk compensation w/movement.  - Standing calf stretch at wall, 2x60s per side.   SciFit multi-peaks level 2 for 8 minutes using BUE/BLEs for neural priming for reciprocal movement, dynamic cardiovascular conditioning and global strength. RPE of 8/10 following activity    GAIT: Gait pattern: decreased arm swing- Right, decreased arm swing- Left, decreased step length- Right, decreased step length- Left, decreased stance time- Left, decreased stride length, decreased hip/knee flexion- Left, decreased ankle dorsiflexion- Left, circumduction- Left, knee flexed in stance- Left, lateral hip instability, lateral lean- Right, lateral lean- Left, decreased trunk rotation, and poor foot clearance- Left Distance walked: Various clinic distances  Assistive device utilized: Single point cane Level of assistance: CGA and Min A Comments: frequent catching of L foot this date due to fatigue, requiring min A to prevent LOB.      PATIENT EDUCATION: Education details: Updates to HEP, plan to fax AFO order.  Person educated: Patient Education method:  Explanation, Demonstration, and Handouts Education comprehension:  verbalized understanding, returned demonstration, and needs further education  HOME EXERCISE PROGRAM: 03/08/23: Walking program: start walking with cane and family member for 6' and progress to 20-30' of walking per day gradually over time.  Access Code: [email or web address] URL: [email or web address] Date: 03/24/2023 Prepared by: Alethia Berthold Haset [place]  Exercises - Side Stepping with Resistance at Thighs and Counter Support  - 1 x daily - 7 x weekly - 3 sets - 10 reps - Forward Monster Walk with Resistance at [employer] and Counter Support  - 1 x daily - 7 x weekly - 3 sets - 10 reps - Backward Monster Walk with Resistance at [employer] and Counter Support  - 1 x daily - 7 x weekly - 3 sets - 10 reps - Standing 3-Way Leg Reach with Resistance at Ankles and Counter Support  - 1 x daily - 7 x weekly - 2 sets - 10 reps - Calf stretch  - 1 x daily - 7 x weekly - 3 sets - 45-60 seconds hold   GOALS: Goals reviewed with patient? Yes  SHORT TERM GOALS: Target date: 04/05/2023    Patient will demo 50% compliance with walking program at home with family member Baseline: Goal status: INITIAL  2. Patient will be compliant with using st. Cane for his mobility 100% of the time.  Baseline: not fully compliant with cane  Goal status: MET   LONG TERM GOALS: Target date: 05/03/2023    Patient will demo gait speed of >0.80 m/s without AD to improve walking speed and reduce fall risk Baseline:  Goal status: INITIAL  2.  Pt will demo FGA score of >20/30 to reduce fall risk. Baseline: 7/30 (03/08/23) Goal status: INITIAL  3.  Pt will be able to navigate steps up and down with st. [place] cane and without rail to be able to go in and out of house at home Independently. Baseline: requires family member's assistance (03/08/23) Goal status: INITIAL   ASSESSMENT:  CLINICAL IMPRESSION: Emphasis of skilled PT session on reviewing/updating  HEP from previous POC. Pt continues to demonstrate significant functional hip weakness bilaterally, LLE >RLE, as well as decreased step clearance of LLE, placing him at high fall risk due to catching of L foot w/gait. Therapist to fax AFO order this date. Pt does report compliance w/using cane at home at all times, meeting STG #2. Pt will continue to benefit from skilled PT to improved functional strength, endurance and safety w/ambulation for reduced fall frequency and fall risk. Continue POC.   OBJECTIVE IMPAIRMENTS: Abnormal gait, decreased activity tolerance, decreased balance, decreased coordination, decreased endurance, decreased mobility, difficulty walking, decreased ROM, decreased strength, decreased safety awareness, hypomobility, increased muscle spasms, impaired flexibility, impaired sensation, impaired tone, impaired UE functional use, improper body mechanics, postural dysfunction, and pain.   ACTIVITY LIMITATIONS: carrying, lifting, bending, standing, squatting, stairs, transfers, and dressing  PARTICIPATION LIMITATIONS: meal prep, cleaning, driving, shopping, community activity, and yard work  PERSONAL FACTORS: Time since onset of injury/illness/exacerbation are also affecting patient's functional outcome.   REHAB POTENTIAL: Good  CLINICAL DECISION MAKING: Stable/uncomplicated  EVALUATION COMPLEXITY: Low  PLAN:  PT FREQUENCY: 2x/week  PT DURATION: 8 weeks  PLANNED INTERVENTIONS: 97164- PT Re-evaluation, 97110-Therapeutic exercises, 97530- Therapeutic activity, 97112- Neuromuscular re-education, 97535- Self Care, 44010- Manual therapy, [phone]- Gait training, Patient/Family education, Balance training, Stair training, Joint mobilization, Cryotherapy, and Moist heat  PLAN FOR NEXT SESSION: [employer]? continue with gait and balance, try Ground reaction AFO on L foot with gait. Hip flexion/abduction strength, endurance, single leg  stability, add ankle to HEP   Taira Knabe E Leigh Blas,  PT,DPT 03/24/2023, 10:18 AM

## 2023-03-24 NOTE — Therapy (Signed)
OUTPATIENT OCCUPATIONAL THERAPY NEURO EVALUATION  Patient Name: Jonathan [name] MRN: [id number] DOB:17-Jan-1976, 47 y.o., male Today's Date: 03/24/2023  PCP: Grayce Sessions, NP  REFERRING PROVIDER: Drema Dallas, DO   END OF SESSION:  OT End of Session - 03/24/23 1052     Visit Number 1    Number of Visits 9   including eval   Date for OT Re-Evaluation 05/19/23    Authorization Type [employer], auth required at 12th visit for PT and OT combined visits, VL:27    OT Start Time 1017    OT Stop Time 1047    OT Time Calculation (min) 30 min    Activity Tolerance Patient tolerated treatment well    Behavior During Therapy [facility] for tasks assessed/performed             Past Medical History:  Diagnosis Date   Chest pain 12/21/2011   Hypertension    Insomnia    Lower back pain    No past surgical history on file. Patient Active Problem List   Diagnosis Date Noted   Left spastic hemiparesis (HCC) 08/05/2022   HTN (hypertension) 07/08/2022   Acute renal failure superimposed on chronic kidney disease (HCC) 07/08/2022   Elevated lipids 07/08/2022   Right basal ganglia embolic stroke (HCC) 07/04/2022   Acute ischemic stroke (HCC) 06/30/2022   Noncompliance with medication regimen 06/30/2022   Essential hypertension 11/03/2021   Tobacco abuse 12/21/2011   Alcoholism (HCC) 12/21/2011   VERTIGO NOS OR DIZZINESS 07/13/2006    ONSET DATE: 02/22/23 (referral date)  REFERRING DIAG: J88.416 (ICD-10-CM) - Hemiparesis affecting left side as late effect of cerebrovascular accident (HCC)   THERAPY DIAG:  Other lack of coordination - Plan: Ot plan of care cert/re-cert  Muscle weakness (generalized) - Plan: Ot plan of care cert/re-cert  Visuospatial deficit - Plan: Ot plan of care cert/re-cert  Other symptoms and signs involving the musculoskeletal system - Plan: Ot plan of care cert/re-cert  Other symptoms and signs involving the nervous system - Plan: Ot plan of  care cert/re-cert  Rationale for Evaluation and Treatment: Rehabilitation  SUBJECTIVE:   SUBJECTIVE STATEMENT: Pt reports difficulty gripping items with left hand though it has improved since February. Pt reports L arm still feels weak. Pt reported "I don't get much sleep."  Pt accompanied by: self  PERTINENT HISTORY: Hx of CVA (06/2022), HTN, acute renal failure, chronic kidney disease  PRECAUTIONS: Fall  WEIGHT BEARING RESTRICTIONS: No  PAIN:  Are you having pain? No  FALLS: Has patient fallen in last 6 months?  Yes. Number of falls 7-8   LIVING ENVIRONMENT: Lives with:  fiancee and 2 young daughters Lives in: House/apartment Stairs: Yes: External: 4 steps; none Has following equipment at home: Single point cane  PLOF:  needs assistance with putting on shoes, putting on clothes, cooking cleaning. 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  PATIENT GOALS: "to get back to normal"  OBJECTIVE:  Note: Objective measures were completed at Evaluation unless otherwise noted.  HAND DOMINANCE: Right  ADLs: Overall ADLs:  Transfers/ambulation related to ADLs: Eating: ind Grooming: ind UB Dressing: some difficulty with putting L arm through sleeve - some assistance from fiance, no concerns with buttons or zippers LB Dressing: difficulty with donning socks, shoes, pants on L side - receives assistance from fiance Toileting: ind Bathing: ind, some difficulty with washing under L arm and back, uses long-handled sponge Tub Shower transfers: ind but some trouble with lifting L leg Equipment:  Long handled sponge and tub/shower   IADLs: Shopping: fiance completes grocery shopping Light housekeeping: pt ind washes clothes and dishes Meal Prep: ind Community mobility: pt sometimes driving, not too far. Pt reported running into telephone pole recently because L arm "gave out" when turning. Pt primarily drives R handed. Medication management: uses medication  organizer, fiance assists with organizing medication Financial management: fiance assists Handwriting:  No change since the stroke  MOBILITY STATUS: Independent using single-point cane, altered and slow gait.  POSTURE COMMENTS:  forward head Sitting balance:  supports self  ACTIVITY TOLERANCE: Activity tolerance: Pt reports "staying tired" during the day. Pt reports fatigue after PT session today.  FUNCTIONAL OUTCOME MEASURES: Upper Extremity Functional Scale (UEFS): 53    UPPER EXTREMITY ROM:    Active ROM Right eval Left eval  Shoulder flexion [facility]  Shoulder abduction    Shoulder adduction    Shoulder extension    Shoulder internal rotation    Shoulder external rotation    Elbow flexion    Elbow extension    Wrist flexion    Wrist extension    Wrist ulnar deviation    Wrist radial deviation    Wrist pronation    Wrist supination    (Blank rows = not tested)  UPPER EXTREMITY MMT:     MMT Right eval Left eval  Shoulder flexion 5 4-  Shoulder abduction    Shoulder adduction    Shoulder extension    Shoulder internal rotation    Shoulder external rotation    Middle trapezius    Lower trapezius    Elbow flexion    Elbow extension    Wrist flexion    Wrist extension    Wrist ulnar deviation    Wrist radial deviation    Wrist pronation    Wrist supination    (Blank rows = not tested)  HAND FUNCTION: Grip strength: Right: 78.7 lbs; Left: 38.8 lbs  COORDINATION: 9 Hole Peg test: Right: 27 sec; Left: 34 sec  Some ataxic movements of L hand noted.  SENSATION: Pt reports numbness of R upper leg since stroke. Pt denies tingling and numbness of L arm. Pt reports tingling sensation affecting L toes.  EDEMA: None noted  MUSCLE TONE: RUE: Within functional limits and LUE: Within functional limits. Pt reports spasms of R arm which began approx. 4 months ago.  COGNITION: Overall cognitive status: Within functional limits for tasks  assessed  VISION: Subjective report: Pt reports increased blurry vision since stroke, looking both far away and close up (more far away). Pt reports going to eye doctor who recommended readers. Baseline vision: No visual deficits Visual history:  none noted  VISION ASSESSMENT: WFL B eyes peripheral visual field  Patient has difficulty with following activities due to following visual impairments: Pt reports difficulty with reading d/t blurry vision.  PERCEPTION: Not tested  PRAXIS: Not tested  OBSERVATIONS:  Pt ambulated using single-point cane, leaning heavily to R side. Pt often leaned back in chair while seated and leaned on R arm when completing tabletop tasks. Pt appeared well-kept.  TODAY'S TREATMENT:  DATE:   Eval only  PATIENT EDUCATION: Education details: OT role, POC Person educated: Patient Education method: Explanation Education comprehension: verbalized understanding  HOME EXERCISE PROGRAM: TBD   GOALS: Goals reviewed with patient? Yes  SHORT TERM GOALS: Target date: 04/21/23  Pt will be ind with initial HEP Baseline: New to outpt OT Goal status: INITIAL  2.  Patient will complete nine-hole peg with use of LUE in 30 seconds or less.  Baseline:  Left: 34 sec Goal status: INITIAL  3.  Patient will demonstrate at least 42 lbs LUE grip strength as needed to open jars and other containers.  Baseline: Left: 38.8 lbs Goal status: INITIAL  4.  Pt will report no more than mild difficulty completing bathing tasks using adaptive equipment and adaptive strategies PRN. Baseline: Pt reports some difficulty with washing under L arm and back, uses long-handled sponge Goal status: INITIAL  LONG TERM GOALS: Target date: 05/19/23  Pt will be ind with updated HEP Baseline: New to outpt OT Goal status: INITIAL  2.  Pt will report no more than  mild difficulty grasping small items during functional tasks, such as coins. Baseline: Pt reports difficulty with grasping small items Goal status: INITIAL  3.  Patient will demonstrate at least 48 lbs LUE grip strength as needed to open jars and other containers.  Baseline: Left: 38.8 lbs Goal status: INITIAL  4.  Pt will demo improved participation in ADL/IADL tasks, as evidenced by increasing UEFS to at least 65. Baseline: Upper Extremity Functional Scale (UEFS): 53. Specific activities listed above. Goal status: INITIAL  5.  Pt will don and doff shoes, socks, and shirts modI using A/E and adaptive strategies PRN. Baseline: Pt reports requiring assistance from fiance Goal status: INITIAL   ASSESSMENT:  CLINICAL IMPRESSION: Patient is a 47 y.o. male who was seen today for occupational therapy evaluation for hemiparesis affecting left side as late effect of cerebrovascular accident. Hx includes Hx of CVA (06/2022), HTN, acute renal failure, chronic kidney disease. Patient currently presents below baseline level of functioning demonstrating functional deficits and impairments as noted below. Pt would benefit from skilled OT services in the outpatient setting to work on impairments as noted below to help pt return to PLOF as able.     PERFORMANCE DEFICITS: in functional skills including ADLs, IADLs, coordination, dexterity, proprioception, sensation, strength, Fine motor control, Gross motor control, mobility, balance, body mechanics, endurance, vision, and UE functional use, cognitive skills including energy/drive, and psychosocial skills including environmental adaptation.   IMPAIRMENTS: are limiting patient from ADLs, IADLs, rest and sleep, work, leisure, and social participation.   CO-MORBIDITIES: may have co-morbidities  that affects occupational performance. Patient will benefit from skilled OT to address above impairments and improve overall function.  MODIFICATION OR ASSISTANCE TO  COMPLETE EVALUATION: No modification of tasks or assist necessary to complete an evaluation.  OT OCCUPATIONAL PROFILE AND HISTORY: Problem focused assessment: Including review of records relating to presenting problem.  CLINICAL DECISION MAKING: LOW - limited treatment options, no task modification necessary  REHAB POTENTIAL: Good  EVALUATION COMPLEXITY: Low    PLAN:  OT FREQUENCY: 1x/week  OT DURATION: 8 weeks  PLANNED INTERVENTIONS: 97168 OT Re-evaluation, 97535 self care/ADL training, 16109 therapeutic exercise, 97530 therapeutic activity, 97112 neuromuscular re-education, 97140 manual therapy, 97035 ultrasound, 97018 paraffin, 60454 fluidotherapy, 97010 moist heat, 97010 cryotherapy, 97032 electrical stimulation (manual), 97014 electrical stimulation unattended, passive range of motion, functional mobility training, visual/perceptual remediation/compensation, energy conservation, patient/family education, and DME and/or AE instructions  RECOMMENDED OTHER SERVICES: Pt already attending PT sessions  CONSULTED AND AGREED WITH PLAN OF CARE: Patient  PLAN FOR NEXT SESSION:  Check BP PRN Handout - sleep positioning Initiate coordination HEP Initiate theraputty and/or theraband HEP to increase LUE strengthening     For all possible CPT codes, reference the Planned Interventions line above.     Check all conditions that are expected to impact treatment: {Conditions expected to impact treatment:Musculoskeletal disorders and Neurological condition and/or seizures   If treatment provided at initial evaluation, no treatment charged due to lack of authorization.      Wynetta Emery, OT 03/24/2023, 4:24 PM

## 2023-03-31 ENCOUNTER — Ambulatory Visit: Payer: Medicaid Other

## 2023-03-31 ENCOUNTER — Ambulatory Visit: Payer: Medicaid Other | Admitting: Occupational Therapy

## 2023-03-31 VITALS — BP 157/92 | HR 82

## 2023-03-31 VITALS — BP 154/90 | HR 82

## 2023-03-31 DIAGNOSIS — I69354 Hemiplegia and hemiparesis following cerebral infarction affecting left non-dominant side: Secondary | ICD-10-CM | POA: Diagnosis not present

## 2023-03-31 DIAGNOSIS — R41842 Visuospatial deficit: Secondary | ICD-10-CM

## 2023-03-31 DIAGNOSIS — R2681 Unsteadiness on feet: Secondary | ICD-10-CM

## 2023-03-31 DIAGNOSIS — R278 Other lack of coordination: Secondary | ICD-10-CM

## 2023-03-31 DIAGNOSIS — R2689 Other abnormalities of gait and mobility: Secondary | ICD-10-CM

## 2023-03-31 DIAGNOSIS — M6281 Muscle weakness (generalized): Secondary | ICD-10-CM

## 2023-03-31 DIAGNOSIS — R29898 Other symptoms and signs involving the musculoskeletal system: Secondary | ICD-10-CM

## 2023-03-31 DIAGNOSIS — R296 Repeated falls: Secondary | ICD-10-CM

## 2023-03-31 DIAGNOSIS — R29818 Other symptoms and signs involving the nervous system: Secondary | ICD-10-CM

## 2023-03-31 NOTE — Therapy (Signed)
OUTPATIENT OCCUPATIONAL THERAPY Treatment  Patient Name: Jonathan [name] MRN: [id number] DOB:03-Oct-1975, 47 y.o., male Today's Date: 03/31/2023  PCP: Grayce Sessions, NP  REFERRING PROVIDER: Drema Dallas, DO   END OF SESSION:  OT End of Session - 03/31/23 [phone]     Visit Number 2    Number of Visits 9   including eval   Date for OT Re-Evaluation 05/19/23    Authorization Type [employer] Medicaid, auth required at 12th visit for PT and OT combined visits, VL:27    OT Start Time 0818   Pt arrived late to OT session   OT Stop Time 0847    OT Time Calculation (min) 29 min    Activity Tolerance Patient tolerated treatment well    Behavior During Therapy [facility] for tasks assessed/performed              Past Medical History:  Diagnosis Date   Chest pain 12/21/2011   Hypertension    Insomnia    Lower back pain    No past surgical history on file. Patient Active Problem List   Diagnosis Date Noted   Left spastic hemiparesis (HCC) 08/05/2022   HTN (hypertension) 07/08/2022   Acute renal failure superimposed on chronic kidney disease (HCC) 07/08/2022   Elevated lipids 07/08/2022   Right basal ganglia embolic stroke (HCC) 07/04/2022   Acute ischemic stroke (HCC) 06/30/2022   Noncompliance with medication regimen 06/30/2022   Essential hypertension 11/03/2021   Tobacco abuse 12/21/2011   Alcoholism (HCC) 12/21/2011   VERTIGO NOS OR DIZZINESS 07/13/2006    ONSET DATE: 02/22/23 (referral date)  REFERRING DIAG: F62.130 (ICD-10-CM) - Hemiparesis affecting left side as late effect of cerebrovascular accident (HCC)   THERAPY DIAG:  Other lack of coordination  Muscle weakness (generalized)  Visuospatial deficit  Other symptoms and signs involving the musculoskeletal system  Other symptoms and signs involving the nervous system  Rationale for Evaluation and Treatment: Rehabilitation  SUBJECTIVE:   SUBJECTIVE STATEMENT: Pt arrived late to OT session and  reported rushing this morning to arrive for early 8 AM appointment. Pt reports things are going well.  Pt accompanied by: self  PERTINENT HISTORY: Hx of CVA (06/2022), HTN, acute renal failure, chronic kidney disease  PRECAUTIONS: Fall  WEIGHT BEARING RESTRICTIONS: No  PAIN:  Are you having pain? No  FALLS: Has patient fallen in last 6 months?  Yes. Number of falls 7-8   LIVING ENVIRONMENT: Lives with:  fiancee and 2 young daughters Lives in: House/apartment Stairs: Yes: External: 4 steps; none Has following equipment at home: Single point cane  PLOF:  needs assistance with putting on shoes, putting on clothes, cooking cleaning. 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  PATIENT GOALS: "to get back to normal"  OBJECTIVE:  Note: Objective measures were completed at Evaluation unless otherwise noted.  HAND DOMINANCE: Right  ADLs: Overall ADLs:  Transfers/ambulation related to ADLs: Eating: ind Grooming: ind UB Dressing: some difficulty with putting L arm through sleeve - some assistance from fiance, no concerns with buttons or zippers LB Dressing: difficulty with donning socks, shoes, pants on L side - receives assistance from fiance Toileting: ind Bathing: ind, some difficulty with washing under L arm and back, uses long-handled sponge Tub Shower transfers: ind but some trouble with lifting L leg Equipment: Long handled sponge and tub/shower   IADLs: Shopping: fiance completes grocery shopping Light housekeeping: pt ind washes clothes and dishes Meal Prep: ind Community mobility: pt sometimes driving, not too far.  Pt reported running into telephone pole recently because L arm "gave out" when turning. Pt primarily drives R handed. Medication management: uses medication organizer, fiance assists with organizing medication Financial management: fiance assists Handwriting:  No change since the stroke  MOBILITY STATUS: Independent using single-point  cane, altered and slow gait.  POSTURE COMMENTS:  forward head Sitting balance:  supports self  ACTIVITY TOLERANCE: Activity tolerance: Pt reports "staying tired" during the day. Pt reports fatigue after PT session today.  FUNCTIONAL OUTCOME MEASURES: Upper Extremity Functional Scale (UEFS): 53    UPPER EXTREMITY ROM:    Active ROM Right eval Left eval  Shoulder flexion [facility]  Shoulder abduction    Shoulder adduction    Shoulder extension    Shoulder internal rotation    Shoulder external rotation    Elbow flexion    Elbow extension    Wrist flexion    Wrist extension    Wrist ulnar deviation    Wrist radial deviation    Wrist pronation    Wrist supination    (Blank rows = not tested)  UPPER EXTREMITY MMT:     MMT Right eval Left eval  Shoulder flexion 5 4-  Shoulder abduction    Shoulder adduction    Shoulder extension    Shoulder internal rotation    Shoulder external rotation    Middle trapezius    Lower trapezius    Elbow flexion    Elbow extension    Wrist flexion    Wrist extension    Wrist ulnar deviation    Wrist radial deviation    Wrist pronation    Wrist supination    (Blank rows = not tested)  HAND FUNCTION: Grip strength: Right: 78.7 lbs; Left: 38.8 lbs  COORDINATION: 9 Hole Peg test: Right: 27 sec; Left: 34 sec  Some ataxic movements of L hand noted.  SENSATION: Pt reports numbness of R upper leg since stroke. Pt denies tingling and numbness of L arm. Pt reports tingling sensation affecting L toes.  EDEMA: None noted  MUSCLE TONE: RUE: Within functional limits and LUE: Within functional limits. Pt reports spasms of R arm which began approx. 4 months ago.  COGNITION: Overall cognitive status: Within functional limits for tasks assessed  VISION: Subjective report: Pt reports increased blurry vision since stroke, looking both far away and close up (more far away). Pt reports going to eye doctor who recommended readers. Baseline  vision: No visual deficits Visual history:  none noted  VISION ASSESSMENT: WFL B eyes peripheral visual field  Patient has difficulty with following activities due to following visual impairments: Pt reports difficulty with reading d/t blurry vision.  PERCEPTION: Not tested  PRAXIS: Not tested  OBSERVATIONS:  Pt ambulated using single-point cane, leaning heavily to R side. Pt often leaned back in chair while seated and leaned on R arm when completing tabletop tasks. Pt appeared well-kept.  TODAY'S TREATMENT:  DATE:   OT asked pt if pt wanted to continue with OT session today despite pt arriving late, which resulted in decreased time for OT session. Pt agreed to proceed with OT session. Pt reported difficulty arriving for early sessions. Therefore, OT checked pt's upcoming appointment schedule and confirmed other OT/PT sessions were later in the day.  Therapeutic Activities OT educated pt on sleep positioning to improve pt's sleep quality and decrease discomfort of affected UE during sleep (handout provided, see pt instructions). Pt verbalized understanding.  OT initiated coordination HEP (large print handout provided, see pt instructions) - to improve LUE FM coordination, dexterity, proprioception. Pt demo'd understanding of each: -Stacking towers of coins - x5 towers of x7 coins each. -Picking up small objects and placing into container with slot -Shuffling cards -Turning cards over 1 at a time - flip card from top-down, with palm facing down, with palm down then turn palm up -Tear piece of tissue and rolling into small balls with fingertips -Meditation balls - graded down d/t pt demo'ing difficulty. Therefore, rotate small ball around circumference then forwards then backwards. -Rolling pen between fingers and thumb, pen translation -Finger-to-palm then  palm-to-finger translation of small items   Pt sometimes leaned heavily to R and L side. OT educated pt on upright seated posture during tasks to prevent back pain, improve core stability, improve distal mobility with proximal stability. Pt demo'd understanding.  BP checked at end of session, see vital signs. BP high though within therapeutic parameters. Pt reported taking BP medication today. OT educated pt on continuing to take medication as prescribed and strategies to reduce stress. Pt verbalized understanding.  PATIENT EDUCATION: Education details: see today's treatment above, purpose of tasks/HEP Person educated: Patient Education method: Explanation, Verbal cues, and Handouts Education comprehension: verbalized understanding and returned demonstration  HOME EXERCISE PROGRAM: 03/31/23 - sleep positioning (handout), coordination HEP (handout)   GOALS: Goals reviewed with patient? Yes  SHORT TERM GOALS: Target date: 04/21/23  Pt will be ind with initial HEP Baseline: New to outpt OT Goal status: INITIAL  2.  Patient will complete nine-hole peg with use of LUE in 30 seconds or less.  Baseline:  Left: 34 sec Goal status: INITIAL  3.  Patient will demonstrate at least 42 lbs LUE grip strength as needed to open jars and other containers.  Baseline: Left: 38.8 lbs Goal status: INITIAL  4.  Pt will report no more than mild difficulty completing bathing tasks using adaptive equipment and adaptive strategies PRN. Baseline: Pt reports some difficulty with washing under L arm and back, uses long-handled sponge Goal status: INITIAL  LONG TERM GOALS: Target date: 05/19/23  Pt will be ind with updated HEP Baseline: New to outpt OT Goal status: INITIAL  2.  Pt will report no more than mild difficulty grasping small items during functional tasks, such as coins. Baseline: Pt reports difficulty with grasping small items Goal status: INITIAL  3.  Patient will demonstrate at least 48 lbs  LUE grip strength as needed to open jars and other containers.  Baseline: Left: 38.8 lbs Goal status: INITIAL  4.  Pt will demo improved participation in ADL/IADL tasks, as evidenced by increasing UEFS to at least 65. Baseline: Upper Extremity Functional Scale (UEFS): 53. Specific activities listed above. Goal status: INITIAL  5.  Pt will don and doff shoes, socks, and shirts modI using A/E and adaptive strategies PRN. Baseline: Pt reports requiring assistance from fiance Goal status: INITIAL   ASSESSMENT:  CLINICAL IMPRESSION:  Pt tolerated tasks well. Pt would benefit from skilled OT services in the outpatient setting to work on impairments as noted below to help pt return to PLOF as able.     PERFORMANCE DEFICITS: in functional skills including ADLs, IADLs, coordination, dexterity, proprioception, sensation, strength, Fine motor control, Gross motor control, mobility, balance, body mechanics, endurance, vision, and UE functional use, cognitive skills including energy/drive, and psychosocial skills including environmental adaptation.   IMPAIRMENTS: are limiting patient from ADLs, IADLs, rest and sleep, work, leisure, and social participation.   CO-MORBIDITIES: may have co-morbidities  that affects occupational performance. Patient will benefit from skilled OT to address above impairments and improve overall function.  MODIFICATION OR ASSISTANCE TO COMPLETE EVALUATION: No modification of tasks or assist necessary to complete an evaluation.  OT OCCUPATIONAL PROFILE AND HISTORY: Problem focused assessment: Including review of records relating to presenting problem.  CLINICAL DECISION MAKING: LOW - limited treatment options, no task modification necessary  REHAB POTENTIAL: Good  EVALUATION COMPLEXITY: Low    PLAN:  OT FREQUENCY: 1x/week  OT DURATION: 8 weeks  PLANNED INTERVENTIONS: 97168 OT Re-evaluation, 97535 self care/ADL training, 65784 therapeutic exercise, 97530  therapeutic activity, 97112 neuromuscular re-education, 97140 manual therapy, 97035 ultrasound, 97018 paraffin, 69629 fluidotherapy, 97010 moist heat, 97010 cryotherapy, 97032 electrical stimulation (manual), 97014 electrical stimulation unattended, passive range of motion, functional mobility training, visual/perceptual remediation/compensation, energy conservation, patient/family education, and DME and/or AE instructions  RECOMMENDED OTHER SERVICES: Pt already attending PT sessions  CONSULTED AND AGREED WITH PLAN OF CARE: Patient  PLAN FOR NEXT SESSION:  Check BP PRN Review coordination HEP PRN Initiate theraputty and/or theraband HEP to increase LUE strengthening UB dressing/LB dressing A/E and adaptive strategies     For all possible CPT codes, reference the Planned Interventions line above.     Check all conditions that are expected to impact treatment: {Conditions expected to impact treatment:Musculoskeletal disorders and Neurological condition and/or seizures   If treatment provided at initial evaluation, no treatment charged due to lack of authorization.      Wynetta Emery, OT 03/31/2023, 9:59 AM

## 2023-03-31 NOTE — Patient Instructions (Signed)
  Alternate to meditation balls/golf balls: Rotate small ball around circumference then forwards then backwards. Added: Move fingertips up and down pen (pen translation)

## 2023-03-31 NOTE — Therapy (Addendum)
OUTPATIENT PHYSICAL THERAPY NEURO TREATMENT   Patient Name: Jonathan [name] MRN: [id number] DOB:02-25-1976, 47 y.o., male Today's Date: 03/31/2023   PCP: Gwinda Passe, NP REFERRING PROVIDER: Drema Dallas, DO  END OF SESSION:  PT End of Session - 03/31/23 0933     Visit Number 3    Number of Visits 13    Date for PT Re-Evaluation 05/03/23    Authorization Type [employer] (auth required at 12 th visit after eval)    PT Start Time 0847    PT Stop Time 0933    PT Time Calculation (min) 46 min    Equipment Utilized During Treatment Gait belt    Activity Tolerance Treatment limited secondary to medical complications (Comment)   continuous monitoring of blood pressure throguhout session   Behavior During Therapy [facility] for tasks assessed/performed               Past Medical History:  Diagnosis Date   Chest pain 12/21/2011   Hypertension    Insomnia    Lower back pain    History reviewed. No pertinent surgical history. Patient Active Problem List   Diagnosis Date Noted   Left spastic hemiparesis (HCC) 08/05/2022   HTN (hypertension) 07/08/2022   Acute renal failure superimposed on chronic kidney disease (HCC) 07/08/2022   Elevated lipids 07/08/2022   Right basal ganglia embolic stroke (HCC) 07/04/2022   Acute ischemic stroke (HCC) 06/30/2022   Noncompliance with medication regimen 06/30/2022   Essential hypertension 11/03/2021   Tobacco abuse 12/21/2011   Alcoholism (HCC) 12/21/2011   VERTIGO NOS OR DIZZINESS 07/13/2006    ONSET DATE: 02/22/2023  REFERRING DIAG: N62.952 (ICD-10-CM) - Hemiparesis affecting left side as late effect of cerebrovascular accident (HCC) M54.16 (ICD-10-CM) - Right lumbar radiculopathy  THERAPY DIAG:  Muscle weakness (generalized)  Hemiplegia and hemiparesis following cerebral infarction affecting left non-dominant side (HCC)  Unsteadiness on feet  Repeated falls  Other abnormalities of gait and  mobility  Rationale for Evaluation and Treatment: Rehabilitation  SUBJECTIVE:                                                                                                                                                                                             SUBJECTIVE STATEMENT: Patient presented to physical therapy w/ SPC. Denied any falls but reported several near falls due to tripping over his left foot. He said he had a headache that went away about 15 min ago. Stated he did HEP twice since last visit.   Pt accompanied by: self  PERTINENT HISTORY: Hx of CVA (06/2022), HTN  PAIN:  Are you having pain?  No  PRECAUTIONS: Fall  RED FLAGS: None   WEIGHT BEARING RESTRICTIONS: No  FALLS: Has patient fallen in last 6 months? Yes. Number of falls 7-8  LIVING ENVIRONMENT: Lives with:  fiancee and 2 young daughters Lives in: House/apartment Stairs: Yes: External: 4 steps; none Has following equipment at home: Single point cane  PLOF:  needs assistance with putting on shoes, putting on clothes, cooking cleaning.  PATIENT GOALS: "Try to get my strength back"  OBJECTIVE:  Note: Objective measures were completed at Evaluation unless otherwise noted.  DIAGNOSTIC FINDINGS: 06/30/22  IMPRESSION: Small acute infarct in the descending white matter tracks of the right basal ganglia and overlying frontal lobe.  COGNITION: Overall cognitive status: Within functional limits for tasks assessed LOWER EXTREMITY ROM:     Active  Right Eval Left Eval  Hip flexion    Hip extension    Hip abduction    Hip adduction    Hip internal rotation    Hip external rotation    Knee flexion    Knee extension    Ankle dorsiflexion    Ankle plantarflexion    Ankle inversion    Ankle eversion     (Blank rows = not tested)  LOWER EXTREMITY MMT:    MMT Right Eval Left Eval  Hip flexion    Hip extension    Hip abduction    Hip adduction    Hip internal rotation    Hip external  rotation    Knee flexion    Knee extension    Ankle dorsiflexion    Ankle plantarflexion    Ankle inversion    Ankle eversion    (Blank rows = not tested)    TODAY'S TREATMENT:      Vitals:  - beginning of session: 148/89, 85 - post standing exercise: 160/95, 86 - seated rest break: 140/94, 86 - 115': 157/97, 86 - seated rest break: 161/91, 86 - post seated exercise: 172/95, 85 - end of session: 154/90, 82  Gait: - 230' w/ SPC and CGA   - tripped over L foot 2-3 times but able to recover himself   Parallel Bars: - Hurdles:  - fwd, two laps w/  R LE leading, two laps w/ L LE leading CGA  - lat, two laps w/ R LE leading, two laps w/ L LE leading CGA  - L LE more challenging in fwd and lat direction, able to clear height of hurdle w/ significant focus to do so   - Stepping stones under blue mat CGA   - four laps   - tripped over L foot 1-2 times but able to recover himself  Seated: - marching over 12lb kb   - 2x10 reps bilat   - L LE more challenging, wanted to lean trunk back    PATIENT EDUCATION: Education details: Updates to HEP, plan to fax AFO order.  Person educated: Patient Education method: Explanation, Demonstration, and Handouts Education comprehension: verbalized understanding, returned demonstration, and needs further education  HOME EXERCISE PROGRAM: 03/08/23: Walking program: start walking with cane and family member for 6' and progress to 20-30' of walking per day gradually over time.  Access Code: [email or web address] URL: [email or web address] Date: 03/24/2023 Prepared by: Alethia Berthold Plaster  Exercises - Side Stepping with Resistance at Thighs and Counter Support  - 1 x daily - 7 x weekly - 3 sets - 10 reps - Forward Monster Walk with Resistance at [employer] and Counter Support  - 1 x daily - 7 x weekly -  3 sets - 10 reps - Backward Monster Walk with Resistance at [employer] and Counter Support  - 1 x daily - 7 x weekly - 3 sets - 10 reps - Standing  3-Way Leg Reach with Resistance at Ankles and Counter Support  - 1 x daily - 7 x weekly - 2 sets - 10 reps - Calf stretch  - 1 x daily - 7 x weekly - 3 sets - 45-60 seconds hold   GOALS: Goals reviewed with patient? Yes  SHORT TERM GOALS: Target date: 04/05/2023    Patient will demo 50% compliance with walking program at home with family member Baseline: Goal status: INITIAL  2. Patient will be compliant with using st. Cane for his mobility 100% of the time.  Baseline: not fully compliant with cane  Goal status: MET   LONG TERM GOALS: Target date: 05/03/2023    Patient will demo gait speed of >0.80 m/s without AD to improve walking speed and reduce fall risk Baseline:  Goal status: INITIAL  2.  Pt will demo FGA score of >20/30 to reduce fall risk. Baseline: 7/30 (03/08/23) Goal status: INITIAL  3.  Pt will be able to navigate steps up and down with st[place] cane and without rail to be able to go in and out of house at home Independently. Baseline: requires family member's assistance (03/08/23) Goal status: INITIAL   ASSESSMENT:  CLINICAL IMPRESSION: Patient seen for skilled physical therapy with emphasis on L LE strengthening, reducing fall risk, and cardiovascular endurance. When ambulating around the gym, he tripped over his L foot 2-3 times but was able to maintain standing. Educated patient on using his cane at all times for safety, as he said he doesn't always use it at home. He did well with hurdle exercise with foot clearance; however, his blood pressure went up requiring seated rest break to bring it down. All vitals are recorded above. After seated rest break, took one lap around the gym to check response and blood pressure was elevated. Did seated exercise for remainder of session. Seated marching exercise was challenging on the left and required verbal cueing to challenge his hip flexor and not lean backward with his trunk. Educated patient on monitoring his blood  pressure at home, following his HEP, and using his [facility] for safety. Patient verbalized understanding. Would continue to benefit from skilled PT to reduce fall risk and improve exercise tolerance. Continue POC.   OBJECTIVE IMPAIRMENTS: Abnormal gait, decreased activity tolerance, decreased balance, decreased coordination, decreased endurance, decreased mobility, difficulty walking, decreased ROM, decreased strength, decreased safety awareness, hypomobility, increased muscle spasms, impaired flexibility, impaired sensation, impaired tone, impaired UE functional use, improper body mechanics, postural dysfunction, and pain.   ACTIVITY LIMITATIONS: carrying, lifting, bending, standing, squatting, stairs, transfers, and dressing  PARTICIPATION LIMITATIONS: meal prep, cleaning, driving, shopping, community activity, and yard work  PERSONAL FACTORS: Time since onset of injury/illness/exacerbation are also affecting patient's functional outcome.   REHAB POTENTIAL: Good  CLINICAL DECISION MAKING: Stable/uncomplicated  EVALUATION COMPLEXITY: Low  PLAN:  PT FREQUENCY: 2x/week  PT DURATION: 8 weeks  PLANNED INTERVENTIONS: 97164- PT Re-evaluation, 97110-Therapeutic exercises, 97530- Therapeutic activity, 97112- Neuromuscular re-education, 97535- Self Care, 25366- Manual therapy, [phone]- Gait training, Patient/Family education, Balance training, Stair training, Joint mobilization, Cryotherapy, and Moist heat  PLAN FOR NEXT SESSION: [facility]? continue with gait and balance, try Ground reaction AFO on L foot with gait. Hip flexion/abduction strength, endurance, single leg stability, add ankle to HEP   Koltyn Kelsay M Sabien Umland,  SPT 03/31/2023, 10:02 AM

## 2023-04-03 ENCOUNTER — Ambulatory Visit: Payer: Medicaid Other | Admitting: Neurology

## 2023-04-06 ENCOUNTER — Telehealth: Payer: Self-pay

## 2023-04-06 ENCOUNTER — Ambulatory Visit: Payer: Medicaid Other

## 2023-04-06 ENCOUNTER — Ambulatory Visit: Payer: Medicaid Other | Admitting: Occupational Therapy

## 2023-04-06 VITALS — BP 160/100 | HR 86

## 2023-04-06 DIAGNOSIS — M5416 Radiculopathy, lumbar region: Secondary | ICD-10-CM

## 2023-04-06 DIAGNOSIS — M6281 Muscle weakness (generalized): Secondary | ICD-10-CM

## 2023-04-06 DIAGNOSIS — R278 Other lack of coordination: Secondary | ICD-10-CM

## 2023-04-06 DIAGNOSIS — R2681 Unsteadiness on feet: Secondary | ICD-10-CM

## 2023-04-06 DIAGNOSIS — I639 Cerebral infarction, unspecified: Secondary | ICD-10-CM

## 2023-04-06 DIAGNOSIS — R2689 Other abnormalities of gait and mobility: Secondary | ICD-10-CM

## 2023-04-06 NOTE — Telephone Encounter (Signed)
Dr. Everlena Cooper,  Jonathan [name] is being treated by physical therapy for deficits s/p CVA and R lumbar radiculopathy.  An order was previously placed for an AFO; however, the order simply read "left" and needs to specify that it is a "left AFO". If you could please enter a new order stating "left AFO" or addend the current order.     Thank you, Westley Foots, PT, DPT, [facility] [address] [place], [place]  [address] Phone:  [phone] Fax:  [phone]

## 2023-04-06 NOTE — Telephone Encounter (Signed)
New order added

## 2023-04-06 NOTE — Therapy (Addendum)
OUTPATIENT PHYSICAL THERAPY NEURO TREATMENT   Patient Name: Jonathan [name] MRN: [id number] DOB:July 30, 1975, 47 y.o., male Today's Date: 04/06/2023   PCP: Gwinda Passe, NP REFERRING PROVIDER: Drema Dallas, DO  END OF SESSION:  PT End of Session - 04/06/23 1013     Visit Number 3    Number of Visits 13    Date for PT Re-Evaluation 05/03/23    Authorization Type [employer] Medicaid (auth required at 12 th visit after eval)    PT Start Time 1015    PT Stop Time 1032    PT Time Calculation (min) 17 min    Activity Tolerance Treatment limited secondary to medical complications (Comment)   BP too high              Past Medical History:  Diagnosis Date   Chest pain 12/21/2011   Hypertension    Insomnia    Lower back pain    History reviewed. No pertinent surgical history. Patient Active Problem List   Diagnosis Date Noted   Left spastic hemiparesis (HCC) 08/05/2022   HTN (hypertension) 07/08/2022   Acute renal failure superimposed on chronic kidney disease (HCC) 07/08/2022   Elevated lipids 07/08/2022   Right basal ganglia embolic stroke (HCC) 07/04/2022   Acute ischemic stroke (HCC) 06/30/2022   Noncompliance with medication regimen 06/30/2022   Essential hypertension 11/03/2021   Tobacco abuse 12/21/2011   Alcoholism (HCC) 12/21/2011   VERTIGO NOS OR DIZZINESS 07/13/2006    ONSET DATE: 02/22/2023  REFERRING DIAG: W09.811 (ICD-10-CM) - Hemiparesis affecting left side as late effect of cerebrovascular accident (HCC) M54.16 (ICD-10-CM) - Right lumbar radiculopathy  THERAPY DIAG:  Muscle weakness (generalized)  Unsteadiness on feet  Other lack of coordination  Other abnormalities of gait and mobility  Rationale for Evaluation and Treatment: Rehabilitation  SUBJECTIVE:                                                                                                                                                                                              SUBJECTIVE STATEMENT: Patient presented to physical therapy w/ SPC. No falls, one stumble over his dog but was able to catch himself. No headache at time of session, had one yesterday. Did "a few" of the HEP exercises. Reported he drove himself for the first time in 4-5 months.   Pt accompanied by: self  PERTINENT HISTORY: Hx of CVA (06/2022), HTN  PAIN:  Are you having pain? No  PRECAUTIONS: Fall  RED FLAGS: None   WEIGHT BEARING RESTRICTIONS: No  FALLS: Has patient fallen in last 6 months? Yes. Number of falls 7-8  LIVING ENVIRONMENT: Lives with:  fiancee and 2 young daughters Lives in: House/apartment Stairs: Yes: External: 4 steps; none Has following equipment at home: Single point cane  PLOF:  needs assistance with putting on shoes, putting on clothes, cooking cleaning.  PATIENT GOALS: "Try to get my strength back"  OBJECTIVE:  Note: Objective measures were completed at Evaluation unless otherwise noted.  DIAGNOSTIC FINDINGS: 06/30/22  IMPRESSION: Small acute infarct in the descending white matter tracks of the right basal ganglia and overlying frontal lobe.  COGNITION: Overall cognitive status: Within functional limits for tasks assessed LOWER EXTREMITY ROM:     Active  Right Eval Left Eval  Hip flexion    Hip extension    Hip abduction    Hip adduction    Hip internal rotation    Hip external rotation    Knee flexion    Knee extension    Ankle dorsiflexion    Ankle plantarflexion    Ankle inversion    Ankle eversion     (Blank rows = not tested)  LOWER EXTREMITY MMT:    MMT Right Eval Left Eval  Hip flexion    Hip extension    Hip abduction    Hip adduction    Hip internal rotation    Hip external rotation    Knee flexion    Knee extension    Ankle dorsiflexion    Ankle plantarflexion    Ankle inversion    Ankle eversion    (Blank rows = not tested)    TODAY'S TREATMENT:      Vitals: not appropriate for PT    148/102 150/100 160/100 (manual)   Faxed AFO order to [facility]  PATIENT EDUCATION: Education details: Updates to HEP, plan to fax AFO order.  Person educated: Patient Education method: Explanation, Demonstration, and Handouts Education comprehension: verbalized understanding, returned demonstration, and needs further education  HOME EXERCISE PROGRAM: 03/08/23: Walking program: start walking with cane and family member for 6' and progress to 20-30' of walking per day gradually over time.  Access Code: [email or web address] URL: [email or web address] Date: 03/24/2023 Prepared by: Alethia Berthold Plaster  Exercises - Side Stepping with Resistance at Thighs and Counter Support  - 1 x daily - 7 x weekly - 3 sets - 10 reps - Forward Monster Walk with Resistance at [employer] and Counter Support  - 1 x daily - 7 x weekly - 3 sets - 10 reps - Backward Monster Walk with Resistance at [employer] and Counter Support  - 1 x daily - 7 x weekly - 3 sets - 10 reps - Standing 3-Way Leg Reach with Resistance at Ankles and Counter Support  - 1 x daily - 7 x weekly - 2 sets - 10 reps - Calf stretch  - 1 x daily - 7 x weekly - 3 sets - 45-60 seconds hold   GOALS: Goals reviewed with patient? Yes  SHORT TERM GOALS: Target date: 04/05/2023    Patient will demo 50% compliance with walking program at home with family member Baseline: Goal status: INITIAL  2. Patient will be compliant with using st. Cane for his mobility 100% of the time.  Baseline: not fully compliant with cane  Goal status: MET   LONG TERM GOALS: Target date: 05/03/2023    Patient will demo gait speed of >0.80 m/s without AD to improve walking speed and reduce fall risk Baseline:  Goal status: INITIAL  2.  Pt will demo FGA score of >20/30 to reduce fall risk. Baseline: 7/30 (  03/08/23) Goal status: INITIAL  3.  Pt will be able to navigate steps up and down with [place] cane and without rail to be able to go in and out of  house at home Independently. Baseline: requires family member's assistance (03/08/23) Goal status: INITIAL   ASSESSMENT:  CLINICAL IMPRESSION: Arrived no charge due to BP being unsafe for PT  PT faxed AFO order to [facility]  Continue POC.   OBJECTIVE IMPAIRMENTS: Abnormal gait, decreased activity tolerance, decreased balance, decreased coordination, decreased endurance, decreased mobility, difficulty walking, decreased ROM, decreased strength, decreased safety awareness, hypomobility, increased muscle spasms, impaired flexibility, impaired sensation, impaired tone, impaired UE functional use, improper body mechanics, postural dysfunction, and pain.   ACTIVITY LIMITATIONS: carrying, lifting, bending, standing, squatting, stairs, transfers, and dressing  PARTICIPATION LIMITATIONS: meal prep, cleaning, driving, shopping, community activity, and yard work  PERSONAL FACTORS: Time since onset of injury/illness/exacerbation are also affecting patient's functional outcome.   REHAB POTENTIAL: Good  CLINICAL DECISION MAKING: Stable/uncomplicated  EVALUATION COMPLEXITY: Low  PLAN:  PT FREQUENCY: 2x/week  PT DURATION: 8 weeks  PLANNED INTERVENTIONS: 97164- PT Re-evaluation, 97110-Therapeutic exercises, 97530- Therapeutic activity, 97112- Neuromuscular re-education, 97535- Self Care, 64332- Manual therapy, [phone]- Gait training, Patient/Family education, Balance training, Stair training, Joint mobilization, Cryotherapy, and Moist heat  PLAN FOR NEXT SESSION: [facility]? continue with gait and balance, try Ground reaction AFO on L foot with gait. Hip flexion/abduction strength, endurance, single leg stability, add ankle to HEP   Miguelangel Korn M Lillie Portner, SPT 04/06/2023, 10:36 AM

## 2023-04-10 ENCOUNTER — Ambulatory Visit: Payer: Medicaid Other | Admitting: Neurology

## 2023-04-10 ENCOUNTER — Ambulatory Visit: Payer: Medicaid Other | Admitting: Physical Therapy

## 2023-04-11 ENCOUNTER — Ambulatory Visit (INDEPENDENT_AMBULATORY_CARE_PROVIDER_SITE_OTHER): Payer: Medicaid Other

## 2023-04-11 VITALS — BP 154/82

## 2023-04-11 DIAGNOSIS — F319 Bipolar disorder, unspecified: Secondary | ICD-10-CM

## 2023-04-11 NOTE — Progress Notes (Unsigned)
   Blood Pressure Recheck Visit  Name: Jonathan [name] MRN: [id number] Date of Birth: [birth year]  [name] presents today for Blood Pressure recheck with clinical support staff.   BP Readings from Last 3 Encounters:  04/11/23 (!) 154/82  04/06/23 (!) 160/100  03/31/23 (!) 154/90    Current Outpatient Medications  Medication Sig Dispense Refill   acetaminophen (TYLENOL) 325 MG tablet Take 1-2 tablets (325-650 mg total) by mouth every 4 (four) hours as needed for mild pain.     amLODipine (NORVASC) 10 MG tablet Take 1 tablet (10 mg total) by mouth daily. 90 tablet 1   aspirin (ASPIRIN LOW DOSE) 81 MG chewable tablet Chew 1 tablet (81 mg total) by mouth daily. 30 tablet 0   atorvastatin (LIPITOR) 20 MG tablet Take 1 tablet (20 mg total) by mouth daily. 30 tablet 0   Baclofen 5 MG TABS Take 1 tablet (5 mg total) by mouth 3 (three) times daily as needed. 90 tablet 5   carvedilol (COREG) 6.25 MG tablet Take 1 tablet (6.25 mg total) by mouth 2 (two) times daily with a meal. 180 tablet 1   ergocalciferol (VITAMIN D2) 1.25 MG (50000 UT) capsule Take 1 capsule (50,000 Units total) by mouth once a week. 8 capsule 0   gabapentin (NEURONTIN) 100 MG capsule Take 1 capsule (100 mg total) by mouth at bedtime. 90 capsule 1   irbesartan (AVAPRO) 75 MG tablet Take 1 tablet (75 mg total) by mouth daily. 90 tablet 1   Multiple Vitamins-Minerals (MULTIVITAMIN MEN) TABS Take 1 tablet by mouth daily with breakfast.     traZODone (DESYREL) 100 MG tablet Take 1 tablet (100 mg total) by mouth at bedtime. 90 tablet 1   No current facility-administered medications for this visit.    Hypertensive Medication Review: Patient states that they are taking all their hypertensive medications as prescribed and their last dose of hypertensive medications was this morning   Documentation of any medication adherence discrepancies: none  Provider Recommendation:  Spoke to [place] and she went into the  room with the patient and pt stated he has been out of his medication. Per provider have pt come back in 4 weeks to for bp check  Patient has been scheduled to follow up with ***   Patient has been given provider's recommendations and does not have any questions or concerns at this time. Patient will contact the office for any future questions or concerns.

## 2023-04-11 NOTE — Patient Instructions (Signed)
Please call [facility] to schedule colonoscopy PH# [phone]

## 2023-04-19 ENCOUNTER — Ambulatory Visit (INDEPENDENT_AMBULATORY_CARE_PROVIDER_SITE_OTHER): Payer: Medicaid Other | Admitting: Psychiatry

## 2023-04-19 ENCOUNTER — Encounter (HOSPITAL_COMMUNITY): Payer: Self-pay | Admitting: Psychiatry

## 2023-04-19 DIAGNOSIS — F1221 Cannabis dependence, in remission: Secondary | ICD-10-CM

## 2023-04-19 DIAGNOSIS — F1421 Cocaine dependence, in remission: Secondary | ICD-10-CM

## 2023-04-19 DIAGNOSIS — F1121 Opioid dependence, in remission: Secondary | ICD-10-CM

## 2023-04-19 DIAGNOSIS — F411 Generalized anxiety disorder: Secondary | ICD-10-CM | POA: Diagnosis not present

## 2023-04-19 DIAGNOSIS — F332 Major depressive disorder, recurrent severe without psychotic features: Secondary | ICD-10-CM

## 2023-04-19 MED ORDER — FLUOXETINE HCL 20 MG PO CAPS: 20.0000 mg | ORAL_CAPSULE | Freq: Every day | ORAL | 2 refills | Status: DC

## 2023-04-19 NOTE — Patient Instructions (Addendum)
Thank you for attending your appointment today.  -- START Prozac 20 mg daily -- START melatonin 3 mg at night -- Continue other medications as prescribed.  Please do not make any changes to medications without first discussing with your provider. If you are experiencing a psychiatric emergency, please call 911 or present to your nearest emergency department. Additional crisis, medication management, and therapy resources are included below.  [facility]  [address], [place], [place] [address] [phone] WALK-IN URGENT CARE 24/7 FOR ANYONE [address] [place], [place]  [phone] Fax: [phone] [email or web address] *Interpreters available *Accepts all insurance and uninsured for Urgent Care needs *Accepts Medicaid and uninsured for outpatient treatment (below)      ONLY FOR [facility]  Below:    Outpatient New Patient Assessment/Therapy Walk-ins:        Monday, Wednesday, and Thursday 8am until slots are full (first come, first served)                   New Patient Psychiatry/Medication Management        Monday-Friday 8am-11am (first come, first served)               For all walk-ins we ask that you arrive by 7:15am, because patients will be seen in the order of arrival.
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
## 2023-04-20 ENCOUNTER — Other Ambulatory Visit (INDEPENDENT_AMBULATORY_CARE_PROVIDER_SITE_OTHER): Payer: Self-pay | Admitting: Primary Care

## 2023-04-20 ENCOUNTER — Telehealth: Payer: Self-pay

## 2023-04-20 ENCOUNTER — Ambulatory Visit: Payer: Medicaid Other | Admitting: Occupational Therapy

## 2023-04-20 ENCOUNTER — Ambulatory Visit: Payer: Medicaid Other

## 2023-04-20 VITALS — BP 168/106 | HR 87

## 2023-04-20 DIAGNOSIS — R29818 Other symptoms and signs involving the nervous system: Secondary | ICD-10-CM | POA: Insufficient documentation

## 2023-04-20 DIAGNOSIS — M6281 Muscle weakness (generalized): Secondary | ICD-10-CM

## 2023-04-20 DIAGNOSIS — R2681 Unsteadiness on feet: Secondary | ICD-10-CM | POA: Insufficient documentation

## 2023-04-20 DIAGNOSIS — R278 Other lack of coordination: Secondary | ICD-10-CM

## 2023-04-20 DIAGNOSIS — I69354 Hemiplegia and hemiparesis following cerebral infarction affecting left non-dominant side: Secondary | ICD-10-CM | POA: Insufficient documentation

## 2023-04-20 DIAGNOSIS — R29898 Other symptoms and signs involving the musculoskeletal system: Secondary | ICD-10-CM | POA: Insufficient documentation

## 2023-04-20 DIAGNOSIS — R41842 Visuospatial deficit: Secondary | ICD-10-CM | POA: Insufficient documentation

## 2023-04-20 DIAGNOSIS — R296 Repeated falls: Secondary | ICD-10-CM | POA: Insufficient documentation

## 2023-04-20 DIAGNOSIS — R2689 Other abnormalities of gait and mobility: Secondary | ICD-10-CM | POA: Insufficient documentation

## 2023-04-20 NOTE — Telephone Encounter (Signed)
Good morning,   Jonathan [name] is being seen by PT and OT in regards to deficits s/p CVA. He has been very limited in his progress/ability to participate in therapies due to consistently elevated BP. He reports being compliant with HTN rx, but diastolic readings remain >100. I believe he would benefit from further management of his HTN to allow for full participation in therapy.   Thank you for your time,  Westley Foots, PT, DPT, CBIS

## 2023-04-20 NOTE — Therapy (Signed)
OUTPATIENT PHYSICAL THERAPY NEURO TREATMENT   Patient Name: Jonathan [name] MRN: [id number] DOB:[birth year], 47 y.o., male Today's Date: 04/20/2023   PCP: Gwinda Passe, NP REFERRING PROVIDER: Drema Dallas, DO  END OF SESSION:  PT End of Session - 04/20/23 1023     Visit Number 4    Number of Visits 13    Date for PT Re-Evaluation 05/03/23    Authorization Type [employer] (auth required at 12 th visit after eval)    PT Start Time 1023    PT Stop Time 1040    PT Time Calculation (min) 17 min    Activity Tolerance Treatment limited secondary to medical complications (Comment)   BP too high              Past Medical History:  Diagnosis Date   Anxiety    Chest pain 12/21/2011   Depression    Hypertension    Insomnia    Lower back pain    History reviewed. No pertinent surgical history. Patient Active Problem List   Diagnosis Date Noted   Generalized anxiety disorder 04/19/2023   Severe episode of recurrent major depressive disorder, without psychotic features (HCC) 04/19/2023   Left spastic hemiparesis (HCC) 08/05/2022   HTN (hypertension) 07/08/2022   Acute renal failure superimposed on chronic kidney disease (HCC) 07/08/2022   Elevated lipids 07/08/2022   Right basal ganglia embolic stroke (HCC) 07/04/2022   Acute ischemic stroke (HCC) 06/30/2022   Noncompliance with medication regimen 06/30/2022   Essential hypertension 11/03/2021   Tobacco abuse 12/21/2011   VERTIGO NOS OR DIZZINESS 07/13/2006    ONSET DATE: 02/22/2023  REFERRING DIAG: X91.478 (ICD-10-CM) - Hemiparesis affecting left side as late effect of cerebrovascular accident (HCC) M54.16 (ICD-10-CM) - Right lumbar radiculopathy  THERAPY DIAG:  Muscle weakness (generalized)  Unsteadiness on feet  Other lack of coordination  Other abnormalities of gait and mobility  Rationale for Evaluation and Treatment: Rehabilitation  SUBJECTIVE:                                                                                                                                                                                              SUBJECTIVE STATEMENT: Patient presented to physical therapy w/ fiance, no AD (lost his SPC). Denied falls. Reported BP readings at home have been "up and down".   Pt accompanied by: significant other  PERTINENT HISTORY: Hx of CVA (06/2022), HTN  PAIN:  Are you having pain? No  PRECAUTIONS: Fall  RED FLAGS: None   WEIGHT BEARING RESTRICTIONS: No  FALLS: Has patient fallen in last 6 months? Yes. Number of falls 7-8  LIVING ENVIRONMENT: Lives with:  fiancee and 2 young daughters Lives in: House/apartment Stairs: Yes: External: 4 steps; none Has following equipment at home: Single point cane  PLOF:  needs assistance with putting on shoes, putting on clothes, cooking cleaning.  PATIENT GOALS: "Try to get my strength back"  OBJECTIVE:  Note: Objective measures were completed at Evaluation unless otherwise noted.  DIAGNOSTIC FINDINGS: 06/30/22  IMPRESSION: Small acute infarct in the descending white matter tracks of the right basal ganglia and overlying frontal lobe.  COGNITION: Overall cognitive status: Within functional limits for tasks assessed LOWER EXTREMITY ROM:     Active  Right Eval Left Eval  Hip flexion    Hip extension    Hip abduction    Hip adduction    Hip internal rotation    Hip external rotation    Knee flexion    Knee extension    Ankle dorsiflexion    Ankle plantarflexion    Ankle inversion    Ankle eversion     (Blank rows = not tested)  LOWER EXTREMITY MMT:    MMT Right Eval Left Eval  Hip flexion    Hip extension    Hip abduction    Hip adduction    Hip internal rotation    Hip external rotation    Knee flexion    Knee extension    Ankle dorsiflexion    Ankle plantarflexion    Ankle inversion    Ankle eversion    (Blank rows = not tested)    TODAY'S TREATMENT:      Vitals:    04/20/23 1034 04/20/23 1038  BP: (!) 151/101 (!) 168/106  Pulse: 87    Not appropriate for PT   PATIENT EDUCATION: Education details: Updates to HEP, plan to fax AFO order.  Person educated: Patient Education method: Explanation, Demonstration, and Handouts Education comprehension: verbalized understanding, returned demonstration, and needs further education  HOME EXERCISE PROGRAM: 03/08/23: Walking program: start walking with cane and family member for 6' and progress to 20-30' of walking per day gradually over time.  Access Code: [email or web address] URL: [email or web address] Date: 03/24/2023 Prepared by: Alethia Berthold Plaster  Exercises - Side Stepping with Resistance at Thighs and Counter Support  - 1 x daily - 7 x weekly - 3 sets - 10 reps - Forward Monster Walk with Resistance at [employer] and Counter Support  - 1 x daily - 7 x weekly - 3 sets - 10 reps - Backward Monster Walk with Resistance at [employer] and Counter Support  - 1 x daily - 7 x weekly - 3 sets - 10 reps - Standing 3-Way Leg Reach with Resistance at Ankles and Counter Support  - 1 x daily - 7 x weekly - 2 sets - 10 reps - Calf stretch  - 1 x daily - 7 x weekly - 3 sets - 45-60 seconds hold   GOALS: Goals reviewed with patient? Yes  SHORT TERM GOALS: Target date: 04/05/2023    Patient will demo 50% compliance with walking program at home with family member Baseline: Goal status: INITIAL  2. Patient will be compliant with using st. Cane for his mobility 100% of the time.  Baseline: not fully compliant with cane  Goal status: MET   LONG TERM GOALS: Target date: 05/03/2023    Patient will demo gait speed of >0.80 m/s without AD to improve walking speed and reduce fall risk Baseline:  Goal status: INITIAL  2.  Pt will demo FGA score of >20/30 to  reduce fall risk. Baseline: 7/30 (03/08/23) Goal status: INITIAL  3.  Pt will be able to navigate steps up and down with st. Point cane and without rail to be  able to go in and out of house at home Independently. Baseline: requires family member's assistance (03/08/23) Goal status: INITIAL   ASSESSMENT:  CLINICAL IMPRESSION: Arrived no charge due to BP being unsafe for PT.   Diastolic consistently over 100. Sending message to PCP.   Continue POC.   OBJECTIVE IMPAIRMENTS: Abnormal gait, decreased activity tolerance, decreased balance, decreased coordination, decreased endurance, decreased mobility, difficulty walking, decreased ROM, decreased strength, decreased safety awareness, hypomobility, increased muscle spasms, impaired flexibility, impaired sensation, impaired tone, impaired UE functional use, improper body mechanics, postural dysfunction, and pain.   ACTIVITY LIMITATIONS: carrying, lifting, bending, standing, squatting, stairs, transfers, and dressing  PARTICIPATION LIMITATIONS: meal prep, cleaning, driving, shopping, community activity, and yard work  PERSONAL FACTORS: Time since onset of injury/illness/exacerbation are also affecting patient's functional outcome.   REHAB POTENTIAL: Good  CLINICAL DECISION MAKING: Stable/uncomplicated  EVALUATION COMPLEXITY: Low  PLAN:  PT FREQUENCY: 2x/week  PT DURATION: 8 weeks  PLANNED INTERVENTIONS: 97164- PT Re-evaluation, 97110-Therapeutic exercises, 97530- Therapeutic activity, 97112- Neuromuscular re-education, 97535- Self Care, 34742- Manual therapy, [phone]- Gait training, Patient/Family education, Balance training, Stair training, Joint mobilization, Cryotherapy, and Moist heat  PLAN FOR NEXT SESSION: [employer]? continue with gait and balance, try Ground reaction AFO on L foot with gait. Hip flexion/abduction strength, endurance, single leg stability, add ankle to HEP   Lakya Schrupp M Django Nguyen, SPT 04/20/2023, 10:42 AM

## 2023-04-20 NOTE — Therapy (Signed)
[facility] [address] [place], [place], [address] Phone: [phone]   Fax:  [phone]  Patient Details  Name: Jonathan [name] MRN: [id number] Date of Birth: [birth year] Referring Provider:  Grayce Sessions, NP  Encounter Date: 04/20/2023  Pt arrived for PT/OT sessions, however was sent home by PT d/t elevated BP with diastolic above 100. BP outside therapeutic parameters. See PT note for additional details.   Wynetta Emery, OT 04/20/2023, 11:11 AM  [facility] [address] [place], [place], [address] Phone: [phone]   Fax:  [phone]

## 2023-04-24 ENCOUNTER — Other Ambulatory Visit (HOSPITAL_COMMUNITY): Payer: Medicaid Other

## 2023-04-24 NOTE — Telephone Encounter (Signed)
Engineer, maintenance referral sent to [facility]

## 2023-04-24 NOTE — Telephone Encounter (Signed)
I called the patient and he is agreeable to a [employer] referral.

## 2023-04-25 ENCOUNTER — Telehealth (HOSPITAL_COMMUNITY): Payer: Self-pay | Admitting: Emergency Medicine

## 2023-04-25 NOTE — Telephone Encounter (Signed)
I called and a woman answered. She advised that she would have [facility] call back later.   Benson Setting EMT-P Community Paramedic  [phone]

## 2023-04-27 ENCOUNTER — Ambulatory Visit: Payer: Medicaid Other

## 2023-05-04 ENCOUNTER — Ambulatory Visit: Payer: Medicaid Other

## 2023-05-04 ENCOUNTER — Ambulatory Visit: Payer: Medicaid Other | Admitting: Occupational Therapy

## 2023-05-04 NOTE — Therapy (Signed)
[facility] [address] [place], [place], [address] Phone: [phone]   Fax:  [phone]  Patient Details  Name: Jonathan [name] MRN: [id number] Date of Birth: [birth year] Referring Provider:  No ref. provider found  Encounter Date: 05/04/2023  PHYSICAL THERAPY DISCHARGE SUMMARY  Visits from Start of Care: 3  Current functional level related to goals / functional outcomes: See eval   Remaining deficits: Fall risk, foot drop, impaired balance   Education / Equipment: PT POC, HEP, use of AFO, safe BP parameters   Patient agrees to discharge. Patient goals were  unable to be assessed as patient did not return after last appt . Patient is being discharged due to not returning since the last visit. Patient was severely limited in both his participation and progress due to uncontrolled HTN.   Westley Foots, PT Westley Foots, PT, DPT, CBIS  05/04/2023, 11:14 AM  [facility] [address] [place], [place], [address] Phone: [phone]   Fax:  [phone]

## 2023-05-05 ENCOUNTER — Encounter: Payer: Self-pay | Admitting: Occupational Therapy

## 2023-05-05 DIAGNOSIS — R41842 Visuospatial deficit: Secondary | ICD-10-CM

## 2023-05-05 DIAGNOSIS — R278 Other lack of coordination: Secondary | ICD-10-CM

## 2023-05-05 DIAGNOSIS — R29818 Other symptoms and signs involving the nervous system: Secondary | ICD-10-CM

## 2023-05-05 DIAGNOSIS — M6281 Muscle weakness (generalized): Secondary | ICD-10-CM

## 2023-05-05 DIAGNOSIS — R29898 Other symptoms and signs involving the musculoskeletal system: Secondary | ICD-10-CM

## 2023-05-05 NOTE — Therapy (Signed)
[facility] [address] [place], [place], [address] Phone: [phone]   Fax:  [phone]  Patient Details  Name: Jonathan [name] MRN: [id number] Date of Birth: 1975-11-27  Telephone Call: OT called pt's listed mobile number [phone]). OT reminded pt of upcoming OT appointment next week. However, pt reported he is out-of-town next week for holidays and therefore unable to come to OT appointment. OT educated pt that there were no additional f/u OT appointments scheduled and pt was D/C from PT yesterday. OT educated pt on option to D/C from OT to allow time to manage elevated BP. Pt verbalized understanding and was agreeable to OT D/C today. OT educated pt that pt could resume OT/PT services if another referral is received and once BP is stabilized. Pt verbalized understanding of all.  OCCUPATIONAL THERAPY DISCHARGE SUMMARY  Visits from Start of Care: 2  Current functional level related to goals / functional outcomes: Pt did not meet stated rehab goals d/t limited visits. Pt's participation in therapy sessions was frequently limited by elevated BP.  Remaining deficits: Pt has some remaining functional deficits or pain.   Education / Equipment: Pt has some needed materials and education. See tx notes for more details.    Patient goals were not met. Patient is being discharged due to not returning since the last visit and elevated BP limiting pt's participation in therapy sessions. Pt agreed to OT D/C per phone conversation (see above).  A new referral will be required to resume OT services once pt's BP is stabilized.   Wynetta Emery, OT 05/05/2023, 3:48 PM  [facility] [address] [place], [place], [address] Phone: [phone]   Fax:  [phone]

## 2023-05-08 ENCOUNTER — Other Ambulatory Visit (HOSPITAL_COMMUNITY): Payer: Medicaid Other

## 2023-05-11 ENCOUNTER — Ambulatory Visit: Payer: Medicaid Other | Admitting: Occupational Therapy

## 2023-05-11 ENCOUNTER — Telehealth (HOSPITAL_COMMUNITY): Payer: Self-pay | Admitting: Emergency Medicine

## 2023-05-11 NOTE — Telephone Encounter (Signed)
Attempted contacting [facility] today to again try to schedule initial home visit for paramedicine. Pt did not answer. I did leave a message regarding same leaving contact info and requesting a call back.  Will continue to follow up.   Benson Setting EMT-P Community Paramedic  [phone]

## 2023-05-12 ENCOUNTER — Other Ambulatory Visit (HOSPITAL_COMMUNITY): Payer: Self-pay | Admitting: Psychiatry

## 2023-05-15 ENCOUNTER — Other Ambulatory Visit (HOSPITAL_COMMUNITY): Payer: Medicaid Other

## 2023-05-23 ENCOUNTER — Telehealth (HOSPITAL_COMMUNITY): Payer: Self-pay

## 2023-05-23 NOTE — Telephone Encounter (Signed)
 Attempted to reach Mr. Mccravy in reference to paramedicine visits but no answer. I left a message for him to return my call.   Maralyn Sago, EMT-Paramedic [phone] 05/23/2023

## 2023-05-30 ENCOUNTER — Telehealth (HOSPITAL_COMMUNITY): Payer: Self-pay

## 2023-05-30 NOTE — Telephone Encounter (Signed)
 Spoke to Jonathan [name] and he agreed to home paramedicine visit on Monday at 1:30. I will follow up with home visit note.   Maralyn Sago, EMT-Paramedic [phone] 05/30/2023

## 2023-06-05 ENCOUNTER — Other Ambulatory Visit (HOSPITAL_COMMUNITY): Payer: Self-pay

## 2023-06-05 NOTE — Progress Notes (Signed)
Paramedicine Encounter    Patient ID: Jonathan [name], male    DOB: 1975/11/19, 48 y.o.   MRN: [id number]  Attempted home visit today- he agreed upon 1:30 visit but was not home at my arrival and after I left called saying he had left to go to the store and wanted to reschedule entirely due to having plans. I asked if we could do next week same time- he agreed. I will attempt follow up next week.   Maralyn Sago, EMT-Paramedic [phone] 06/05/2023      ACTION: Home visit completed

## 2023-06-13 NOTE — Progress Notes (Unsigned)
BH MD Outpatient Progress Note  06/14/2023 11:11 AM Jonathan [name]  MRN:  [id number]  Assessment:  Jonathan [name] presents for follow-up evaluation. Today, 06/14/23, patient reports he has tolerated start of Prozac well. Although he denies clear benefit from initiation of Prozac, he does report some improvement in persistence and intensity of depressive symptoms although continues to experience chronic irritability and agitation typically in face of interpersonal aggravations. Reassuringly, he denies any recent episodes of physical aggression or destruction to property and denies any recent substance use (outside of cannabis a few months ago). Will maintain Prozac at current dosing for time being while focusing on other supports and workup including establishing with therapy; obtaining updated labs; and patient starting CPAP.   RTC in 7 weeks by video.  Identifying Information: Jonathan [name] is a 48 y.o. 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  On initial evaluation, patient reported historical diagnosis of bipolar disorder made in childhood however upon further review it appears this was made in the setting of numerous behavioral issues and concurrent substance use. He denied history of discrete episodes of hypomania or mania outside the context of substance use and instead reported symptoms consistent with major depressive disorder predating CVA although certainly exacerbated by stroke. He denied any substance use since Feb 2024 and psychoeducation was provided on recommendation for ongoing cessation. Patient reports historically poor emotion regulation and impulse control with marked irritability leading to behavioral outbursts characterized by verbal aggression and physical  assaults to property (reports past aggression towards others but denies recently); these episodes are typically although not always followed by regret. Given impulsive nature of these episodes, concern is raised for intermittent explosive disorder however as patient also endorses periods of premeditated aggression cannot rule out other disorders of conduct at this time.   Plan:  # MDD  GAD # Consider intermittent explosive disorder Past medication trials:  Seroquel (helpful at first), Lexapro, Ritalin (childhood - felt like a zombie) Status of problem: mild improvement Interventions: -- Continue Prozac 20 mg daily -- R/o contributing medical conditions: TSH, free T4 ordered -- Patient scheduled for individual psychotherapy with Jonathan Needle Cozart LCSW Feb 2025  # Cannabis use disorder (last use approx. Dec 2024)  # Cocaine use disorder in [name] remission # Opioid use disorder in sustained remission Past medication trials: Suboxone Status of problem: improving Interventions: -- Continue to monitor and promote cessation   # Sleep dysregulation  OSA Past medication trials: melatonin Status of problem: chronic Interventions: -- Patient not requiring melatonin 3-5 mg qEvening -- Prescribed trazodone 100 mg nightly by PCP -- Prescribed gabapentin 100 mg nightly by PCP -- Sleep hygiene recommendations previously reviewed -- Patient recently diagnosed with OSA; working to obtain CPAP  Patient was given contact information for behavioral health clinic and was instructed to call 911 for emergencies.   Subjective:  Chief Complaint:  Chief Complaint  Patient presents with   Medication Management    Interval History:   Jonathan [name] reports he is doing "alright" and has been taking Prozac. Tolerating well and denies any adverse effects. Reports may miss a day occasionally but otherwise taking as prescribed. Describes mood overall as "not too moody" and perhaps has been more even. However continues to  feel irritable in response to overstimulation or relational stressors; tries to walk away from  others when this happens. Denies physical aggression or destruction to property. Continues to feel low about 3 days out of the week. Spends his days mostly at home - sleeping and watching TV both day and night. Sleeping about 6-7 hours nightly with additional naps. Was recently diagnosed with OSA and working to get CPAP. Appetite has been stable. Denies passive/active SI. Denies HI but can have thoughts of aggression towards others when "pissed off" but denies acting due to commitment to fiance. Denies any recent substance use outside of cannabis use (1-2 hits) every few months.    Amenable to focusing on other forms of support including establishing in therapy, obtaining updated labs, and following up with treatment for OSA and continuing Prozac as prescribed.   Visit Diagnosis:    ICD-10-CM   1. Severe episode of recurrent major depressive disorder, without psychotic features (HCC)  F33.2     2. Generalized anxiety disorder  F41.1     3. Cocaine use disorder, severe, in [name] remission (HCC)  F14.21     4. Opioid use disorder, severe, in sustained remission (HCC)  F11.21     5. Cannabis use disorder  F12.90       Past Psychiatric History:  Diagnoses: reports diagnosis of bipolar disorder made in childhood for "getting into trouble"; cocaine use disorder; opioid use disorder; cannabis use disorder Medication trials: Seroquel (helpful at first), Lexapro, Ritalin (childhood - felt like a zombie) Previous psychiatrist/therapist: denies recently Hospitalizations: denies Suicide attempts: denies SIB: denies Hx of violence towards others: yes - denies recently Current access to guns: denies Hx of trauma/abuse: denies Legal: prison for 8 months for drug related charges - released in 2011 Substance use:              -- Cocaine: last used Feb 2024; previously used "off and on" from 48 yo-48 yo;  reports periods in which was using daily up to 3-6 grams per week; route: intranasal             -- Percocet: last used in 2019; previously used for period of 7 years; using 20-30 tablets per day                         -- Rehab x2 - 2015, 2017                         -- Prescribed suboxone years ago but did not find it helpful             -- Cannabis: last used 2-3 months ago - 1-2 hits; smoking 0.5 oz per day for a few years             -- Denies use of BZDs, MDMA, hallucinogens             -- Denies history of IVDU             -- Etoh: denies; denies history of excessive use in the past             -- Tobacco: smokes 4-5 cigarettes/day    Past Medical History:  Past Medical History:  Diagnosis Date   Anxiety    Chest pain 12/21/2011   Depression    Hypertension    Insomnia    Lower back pain    History reviewed. No pertinent surgical history.  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 Has siblings who live locally.   Social History   Socioeconomic History   Marital status: Divorced    Spouse name: Not on file   Number of children: Not on file   Years of education: Not on file   Highest education level: Not on file  Occupational History   Not on file  Tobacco Use   Smoking status: Every Day    Current packs/day: 0.25    Types: Cigarettes   Smokeless tobacco: Never  Vaping Use   Vaping status: Never Used  Substance and Sexual Activity   Alcohol use: Not Currently   Drug use: Not Currently    Types: Marijuana, Cocaine, Oxycodone    Comment: occasional use of cannabis (every few months); last cocaine  use Feb 2024; last illicit Percocet use 2019   Sexual activity: Not on file  Other Topics Concern   Not on file  Social History Narrative   Not on file   Social Drivers of Health   Financial Resource Strain: Not on file  Food Insecurity: No Food Insecurity (06/30/2022)   Hunger Vital Sign    Worried About Running Out of Food in the Last Year: Never true    Ran Out of Food in the Last Year: Never true  Transportation Needs: No Transportation Needs (06/30/2022)   PRAPARE - Administrator, Civil Service (Medical): No    Lack of Transportation (Non-Medical): No  Physical Activity: Not on file  Stress: Not on file  Social Connections: Not on file    Allergies: No Known Allergies  Current Medications: Current Outpatient Medications  Medication Sig Dispense Refill   gabapentin (NEURONTIN) 100 MG capsule Take 1 capsule (100 mg total) by mouth at bedtime. 90 capsule 1   traZODone (DESYREL) 100 MG tablet Take 1 tablet (100 mg total) by mouth at bedtime. 90 tablet 1   acetaminophen (TYLENOL) 325 MG tablet Take 1-2 tablets (325-650 mg total) by mouth every 4 (four) hours as needed for mild pain.     amLODipine (NORVASC) 10 MG tablet Take 1 tablet (10 mg total) by mouth daily. 90 tablet 1   aspirin (ASPIRIN LOW DOSE) 81 MG chewable tablet Chew 1 tablet (81 mg total) by mouth daily. 30 tablet 0   atorvastatin (LIPITOR) 20 MG tablet Take 1 tablet (20 mg total) by mouth daily. 30 tablet 0   Baclofen 5 MG TABS Take 1 tablet (5 mg total) by mouth 3 (three) times daily as needed. 90 tablet 5   carvedilol (COREG) 6.25 MG tablet Take 1 tablet (6.25 mg total) by mouth 2 (two) times daily with a meal. 180 tablet 1   ergocalciferol (VITAMIN D2) 1.25 MG (50000 UT) capsule Take 1 capsule (50,000 Units total) by mouth once a week. 8 capsule 0   FLUoxetine (PROZAC) 20 MG capsule Take 1 capsule (20 mg total) by mouth daily. 30 capsule 2   irbesartan (AVAPRO) 75 MG tablet Take 1 tablet (75 mg total)  by mouth daily. 90 tablet 1   Multiple Vitamins-Minerals (MULTIVITAMIN MEN) TABS Take 1 tablet by mouth daily with breakfast.     No current facility-administered  medications for this visit.    ROS: Does not endorse any physical complaints today  Objective:  Psychiatric Specialty Exam: There were no vitals taken for this visit.There is no height or weight on file to calculate BMI.  General Appearance: Casual and somewhat disheveled; not wearing shirt  Eye Contact:  Good  Speech:  Clear and Coherent and Normal Rate  Volume:  Normal  Mood:   "alright"  Affect:   Euthymic; calm; blunted  Thought Content:  Denies AVH; no overt delusional thought content on interview      Suicidal Thoughts:  No  Homicidal Thoughts:  No  Thought Process:  Goal Directed and Linear  Orientation:  Full (Time, Place, and Person)    Memory:  Grossly intact  Judgment:  Fair  Insight:  Fair  Concentration:  Concentration: Good  Recall:  not formally assessed  Fund of Knowledge: Good  Language: Good  Psychomotor Activity:  Normal  Akathisia:  NA  AIMS (if indicated): NA  Assets:  Communication Skills Desire for Improvement Housing Intimacy Leisure Time Social Support Transportation  ADL's:  Intact  Cognition: WNL  Sleep:   dysregulated   PE: General: sits comfortably in view of camera; no acute distress  Pulm: no increased work of breathing on room air  MSK: all extremity movements appear intact  Neuro: no focal neurological deficits observed  Gait & Station: unable to assess by video    Metabolic Disorder Labs: Lab Results  Component Value Date   HGBA1C 5.9 (H) 06/30/2022   MPG 122.63 06/30/2022   No results found for: "PROLACTIN" Lab Results  Component Value Date   CHOL 159 11/28/2022   TRIG 120 11/28/2022   HDL 36 (L) 11/28/2022   CHOLHDL 4.4 11/28/2022   VLDL 32 07/01/2022   LDLCALC 101 (H) 11/28/2022   LDLCALC 81 07/01/2022   No results found for: "TSH"  Therapeutic  Level Labs: No results found for: "LITHIUM" No results found for: "VALPROATE" No results found for: "CBMZ"  Screenings:  GAD-7    Flowsheet Row Office Visit from 12/19/2022 in [facility] Office Visit from 11/28/2022 in [facility]  Total GAD-7 Score 10 8      PHQ2-9    Flowsheet Row Office Visit from 12/19/2022 in [facility] Office Visit from 11/28/2022 in [facility] Office Visit from 08/05/2022 in [facility]  PHQ-2 Total Score 4 2 2   PHQ-9 Total Score 19 18 8       Flowsheet Row Admission (Discharged) from 07/04/2022 in [facility] ED to Hosp-Admission (Discharged) from 06/30/2022 in [facility] from 01/03/2022 in [facility] RISK CATEGORY No Risk No Risk No Risk       Collaboration of Care: Collaboration of Care: Medication Management AEB active medication management, Psychiatrist AEB established with this provider, and Referral or follow-up with counselor/therapist AEB scheduled for individual psychotherapy  Patient/Guardian was advised Release of Information must be obtained prior to any record release in order to collaborate their care with an outside provider. Patient/Guardian was advised if they have not already done so to contact the registration department to sign all necessary forms in order for [place] to release information regarding their care.   Consent: Patient/Guardian gives verbal consent for treatment and assignment of benefits for services provided during this visit. Patient/Guardian expressed understanding and agreed  to proceed.   Televisit via video: I connected with patient on 06/14/23 at 10:30 AM EST by a video enabled telemedicine application and verified that I am speaking with the correct person using two  identifiers.  Location: Patient: home address in [place] Provider: remote office in [place]   I discussed the limitations of evaluation and management by telemedicine and the availability of in person appointments. The patient expressed understanding and agreed to proceed.  I discussed the assessment and treatment plan with the patient. The patient was provided an opportunity to ask questions and all were answered. The patient agreed with the plan and demonstrated an understanding of the instructions.   The patient was advised to call back or seek an in-person evaluation if the symptoms worsen or if the condition fails to improve as anticipated.  I provided 20 minutes dedicated to the care of this patient via video on the date of this encounter to include chart review, face-to-face time with the patient, medication management/counseling, documentation.  Jonathan [name] 06/14/2023, 11:11 AM

## 2023-06-14 ENCOUNTER — Encounter (HOSPITAL_COMMUNITY): Payer: Self-pay | Admitting: Psychiatry

## 2023-06-14 ENCOUNTER — Telehealth (HOSPITAL_COMMUNITY): Payer: Self-pay

## 2023-06-14 ENCOUNTER — Telehealth (HOSPITAL_COMMUNITY): Payer: Medicaid Other | Admitting: Psychiatry

## 2023-06-14 DIAGNOSIS — F411 Generalized anxiety disorder: Secondary | ICD-10-CM | POA: Diagnosis not present

## 2023-06-14 DIAGNOSIS — F1421 Cocaine dependence, in remission: Secondary | ICD-10-CM | POA: Diagnosis not present

## 2023-06-14 DIAGNOSIS — F1121 Opioid dependence, in remission: Secondary | ICD-10-CM | POA: Diagnosis not present

## 2023-06-14 DIAGNOSIS — F332 Major depressive disorder, recurrent severe without psychotic features: Secondary | ICD-10-CM

## 2023-06-14 DIAGNOSIS — F1290 Cannabis use, unspecified, uncomplicated: Secondary | ICD-10-CM | POA: Insufficient documentation

## 2023-06-14 MED ORDER — FLUOXETINE HCL 20 MG PO CAPS: 20.0000 mg | ORAL_CAPSULE | Freq: Every day | ORAL | 2 refills | Status: AC

## 2023-06-14 NOTE — Telephone Encounter (Signed)
Spoke to [place] to schedule a home paramedicine visit. He reports he is sick with a cold and agreed to a home visit next week on Wednesday at 230. Call complete.   Maralyn Sago, EMT-Paramedic [phone] 06/14/2023

## 2023-06-14 NOTE — Patient Instructions (Signed)
Thank you for attending your appointment today.  -- We did not make any medication changes today. Please continue medications as prescribed.  Please do not make any changes to medications without first discussing with your provider. If you are experiencing a psychiatric emergency, please call 911 or present to your nearest emergency department. Additional crisis, medication management, and therapy resources are included below.  [facility]  [address] [place], [place] [address] [phone] WALK-IN URGENT CARE 24/7 FOR ANYONE [address] [place], [place]  [phone] Fax: [phone] [email or web address] *Interpreters available *Accepts all insurance and uninsured for Urgent Care needs *Accepts Medicaid and uninsured for outpatient treatment (below)      ONLY FOR [facility]  Below:    Outpatient New Patient Assessment/Therapy Walk-ins:        Monday, Wednesday, and Thursday 8am until slots are full (first come, first served)                   New Patient Psychiatry/Medication Management        Monday-Friday 8am-11am (first come, first served)               For all walk-ins we ask that you arrive by 7:15am, because patients will be seen in the order of arrival.

## 2023-06-19 ENCOUNTER — Other Ambulatory Visit (HOSPITAL_COMMUNITY): Payer: Medicaid Other

## 2023-06-21 ENCOUNTER — Telehealth (HOSPITAL_COMMUNITY): Payer: Self-pay

## 2023-06-21 NOTE — Telephone Encounter (Signed)
 Que canceled our home visit for today- will follow up.   Roberts Ching, EMT-Paramedic [phone] 06/21/2023

## 2023-06-22 ENCOUNTER — Telehealth (HOSPITAL_COMMUNITY): Payer: Self-pay

## 2023-06-22 ENCOUNTER — Other Ambulatory Visit: Payer: Self-pay

## 2023-06-22 NOTE — Progress Notes (Signed)
 Paramedicine Encounter    Patient ID: Jonathan [name], male    DOB: Apr 04, 1976, 48 y.o.   MRN: [id number]    Attempted to meet with patient in the home- he was not home, did not answer any calls or text. I waited at the home for 20 mins and no responses. I made Slater aware and will route to PCP to make her aware as well of the unsuccessful attempts to meet for initial paramedicine visit. I will try to meet him at his upcoming appointment in the office.   Powell Mirza, EMT-Paramedic [phone] 06/22/2023    Patient Care Team: Celestia Rosaline SQUIBB, NP as PCP - General (Internal Medicine)  Patient Active Problem List   Diagnosis Date Noted   Cannabis use disorder 06/14/2023   Generalized anxiety disorder 04/19/2023   Severe episode of recurrent major depressive disorder, without psychotic features (HCC) 04/19/2023   Left spastic hemiparesis (HCC) 08/05/2022   HTN (hypertension) 07/08/2022   Acute renal failure superimposed on chronic kidney disease (HCC) 07/08/2022   Elevated lipids 07/08/2022   Right basal ganglia embolic stroke (HCC) 07/04/2022   Acute ischemic stroke (HCC) 06/30/2022   Noncompliance with medication regimen 06/30/2022   Essential hypertension 11/03/2021   Tobacco abuse 12/21/2011   VERTIGO NOS OR DIZZINESS 07/13/2006    Current Outpatient Medications:    acetaminophen  (TYLENOL ) 325 MG tablet, Take 1-2 tablets (325-650 mg total) by mouth every 4 (four) hours as needed for mild pain., Disp: , Rfl:    amLODipine  (NORVASC ) 10 MG tablet, Take 1 tablet (10 mg total) by mouth daily., Disp: 90 tablet, Rfl: 1   aspirin  (ASPIRIN  LOW DOSE) 81 MG chewable tablet, Chew 1 tablet (81 mg total) by mouth daily., Disp: 30 tablet, Rfl: 0   atorvastatin  (LIPITOR) 20 MG tablet, Take 1 tablet (20 mg total) by mouth daily., Disp: 30 tablet, Rfl: 0   Baclofen  5 MG TABS, Take 1 tablet (5 mg total) by mouth 3 (three) times daily as needed., Disp: 90 tablet, Rfl: 5   carvedilol   (COREG ) 6.25 MG tablet, Take 1 tablet (6.25 mg total) by mouth 2 (two) times daily with a meal., Disp: 180 tablet, Rfl: 1   ergocalciferol  (VITAMIN D2) 1.25 MG (50000 UT) capsule, Take 1 capsule (50,000 Units total) by mouth once a week., Disp: 8 capsule, Rfl: 0   FLUoxetine  (PROZAC ) 20 MG capsule, Take 1 capsule (20 mg total) by mouth daily., Disp: 30 capsule, Rfl: 2   gabapentin  (NEURONTIN ) 100 MG capsule, Take 1 capsule (100 mg total) by mouth at bedtime., Disp: 90 capsule, Rfl: 1   irbesartan  (AVAPRO ) 75 MG tablet, Take 1 tablet (75 mg total) by mouth daily., Disp: 90 tablet, Rfl: 1   Multiple Vitamins-Minerals (MULTIVITAMIN MEN) TABS, Take 1 tablet by mouth daily with breakfast., Disp: , Rfl:    traZODone  (DESYREL ) 100 MG tablet, Take 1 tablet (100 mg total) by mouth at bedtime., Disp: 90 tablet, Rfl: 1 No Known Allergies   Social History   Socioeconomic History   Marital status: Divorced    Spouse name: Not on file   Number of children: Not on file   Years of education: Not on file   Highest education level: Not on file  Occupational History   Not on file  Tobacco Use   Smoking status: Every Day    Current packs/day: 0.25    Types: Cigarettes   Smokeless tobacco: Never  Vaping Use   Vaping status: Never Used  Substance and Sexual  Activity   Alcohol use: Not Currently   Drug use: Not Currently    Types: Marijuana, Cocaine, Oxycodone    Comment: occasional use of cannabis (every few months); last cocaine use Feb 2024; last illicit Percocet use 2019   Sexual activity: Not on file  Other Topics Concern   Not on file  Social History Narrative   Not on file   Social Drivers of Health   Financial Resource Strain: Not on file  Food Insecurity: No Food Insecurity (06/30/2022)   Hunger Vital Sign    Worried About Running Out of Food in the Last Year: Never true    Ran Out of Food in the Last Year: Never true  Transportation Needs: No Transportation Needs (06/30/2022)   PRAPARE  - Administrator, Civil Service (Medical): No    Lack of Transportation (Non-Medical): No  Physical Activity: Not on file  Stress: Not on file  Social Connections: Not on file  Intimate Partner Violence: Not At Risk (06/30/2022)   Humiliation, Afraid, Rape, and Kick questionnaire    Fear of Current or Ex-Partner: No    Emotionally Abused: No    Physically Abused: No    Sexually Abused: No    Physical Exam      Future Appointments  Date Time Provider Department Center  07/03/2023 10:00 AM Cozart, Carlyon GRADE, LCSW [facility] None  07/12/2023 10:30 AM Celestia Rosaline SQUIBB, NP [facility] None  08/02/2023 11:00 AM Bahraini, Lauraine LABOR [facility] None  08/23/2023  9:50 AM Skeet Juliene SAUNDERS, DO [facility] None

## 2023-06-22 NOTE — Telephone Encounter (Signed)
 Attempted to meet with patient in the home- he was not home, did not answer any calls or text. I waited at the home for 20 mins and no responses. I made [place] aware and will route to PCP to make her aware as well of the unsuccessful attempts to meet for initial paramedicine visit. I will try to meet him at his upcoming appointment in the office.   Powell Mirza, EMT-Paramedic [phone] 06/22/2023

## 2023-06-23 ENCOUNTER — Encounter (INDEPENDENT_AMBULATORY_CARE_PROVIDER_SITE_OTHER): Payer: Self-pay | Admitting: Primary Care

## 2023-06-26 NOTE — Telephone Encounter (Signed)
 noted

## 2023-07-03 ENCOUNTER — Ambulatory Visit (HOSPITAL_COMMUNITY): Payer: Medicaid Other | Admitting: Clinical

## 2023-07-03 DIAGNOSIS — F331 Major depressive disorder, recurrent, moderate: Secondary | ICD-10-CM

## 2023-07-03 NOTE — Progress Notes (Unsigned)
Comprehensive Clinical Assessment (CCA) Note  07/03/2023 Jonathan [name] [id number] Virtual Visit via Video Note  I connected with Jonathan [name] on 07/03/2023 at 10:00 AM EST by a video enabled telemedicine application and verified that I am speaking with the correct person using two identifiers.  Location: Patient: home Provider: office   I discussed the limitations of evaluation and management by telemedicine and the availability of in person appointments. The patient expressed understanding and agreed to proceed.   Follow Up Instructions: I discussed the assessment and treatment plan with the patient. The patient was provided an opportunity to ask questions and all were answered. The patient agreed with the plan and demonstrated an understanding of the instructions.   The patient was advised to call back or seek an in-person evaluation if the symptoms worsen or if the condition fails to improve as anticipated.  I provided 25 minutes of non-face-to-face time during this encounter.   Loree Fee, LCSW   Chief Complaint:  Chief Complaint  Patient presents with   Anxiety   Depression   Visit Diagnosis:   Major depressive disorder, recurrent episode, moderate with anxious distress  Interpretive Summary: Client is a 48 year old male presenting to the [facility] to establish with outpatient counseling services. Client recently established with a [facility] psychiatrist for the treatment of major depressive disorder and generalized anxiety disorder. Client reported he has been managing well with his medication regimen with no issues. Client reported last having a therapist years ago. Client reported depressive symptoms of low mood and lack of interests persists at least 5 days out of the week. Client reported his symptoms do not include suicidal ideations. Client reported having irritability about half the week. Client reported no property  destruction or confrontational behaviors included with that. Client reported last experiencing AVH depicted as seeing images in his peripheral 4 months ago. Client reported he would like help working on his mood swings and processing grief from his mother passing in 2024. Client reported no illicit substance use. Client presented oriented times five, appropriately dressed and friendly. Client denied hallucinations, delusions, suicidal and homicidal ideations. Client was screened for pain, nutrition, [place] suicide severity and the following SDOH:    07/03/2023   10:13 AM 12/19/2022   10:10 AM 11/28/2022    9:03 AM  GAD 7 : Generalized Anxiety Score  Nervous, Anxious, on Edge 1 1 1   Control/stop worrying 1 2 0  Worry too much - different things 1 2 1   Trouble relaxing 1 2 2   Restless 0 0 1  Easily annoyed or irritable 2 3 3   Afraid - awful might happen 1 0 0  Total GAD 7 Score 7 10 8   Anxiety Difficulty Somewhat difficult       Flowsheet Row Counselor from 07/03/2023 in [facility]  PHQ-9 Total Score 8       Treatment Recommendations: Therapy and psychiatry   CCA Biopsychosocial Intake/Chief Complaint:  client reported he recently met with the psychaitrist for symptoms of depression and anxiety  Current Symptoms/Problems: client reported the medication regimen is working okay.client reported he has been feeling alright lately and irritable at times. client reported the irritablity he has been able to manage on his own. client reported depression is at a 5 out of 10 currently.  Patient Reported Schizophrenia/Schizoaffective Diagnosis in Past: No  Strengths: voluntarily engaging in services  Preferences: counseling and medication management  Abilities: vocal about problems and needs  Type of Services Patient Feels are Needed: No data recorded  Initial Clinical Notes/Concerns: client reported he had a therapist years ago. client reported he is not  sleeping well duing the night. client reported 4 months ago seeing things out the side of his perreiphreal. client reported no issues with thoughts of wanting to harm himself.client reported he started therapy at age 11 for his symptoms.  Mental Health Symptoms Depression:  Change in energy/activity   Duration of Depressive symptoms: Greater than two weeks   Mania:  None   Anxiety:   Irritability   Psychosis:  None   Duration of Psychotic symptoms: No data recorded  Trauma:  None   Obsessions:  None   Compulsions:  None   Inattention:  None   Hyperactivity/Impulsivity:  None   Oppositional/Defiant Behaviors:  None   Emotional Irregularity:  None   Other Mood/Personality Symptoms:  No data recorded   Mental Status Exam Appearance and self-care  Stature:  Average   Weight:  Average weight   Clothing:  Casual   Grooming:  Normal   Cosmetic use:  Age appropriate   Posture/gait:  Normal   Motor activity:  Not Remarkable   Sensorium  Attention:  Normal   Concentration:  Normal   Orientation:  X5   Recall/memory:  Normal   Affect and Mood  Affect:  Congruent   Mood:  Euthymic   Relating  Eye contact:  Normal   Facial expression:  Responsive   Attitude toward examiner:  Cooperative   Thought and Language  Speech flow: Clear and Coherent   Thought content:  Appropriate to Mood and Circumstances   Preoccupation:  None   Hallucinations:  No data recorded  Organization:  No data recorded  Company secretary of Knowledge:  Good   Intelligence:  Average   Abstraction:  Normal   Judgement:  Good   Reality Testing:  Adequate   Insight:  Good   Decision Making:  Normal   Social Functioning  Social Maturity:  Responsible   Social Judgement:  No data recorded  Stress  Stressors:  Office manager Ability:  Normal   Skill Deficits:  Activities of daily living   Supports:  Family     Religion: Religion/Spirituality Are  You A Religious Person?: No  Leisure/Recreation: Leisure / Recreation Do You Have Hobbies?: No  Exercise/Diet: Exercise/Diet Do You Exercise?: No Have You Gained or Lost A Significant Amount of Weight in the Past Six Months?: No Do You Follow a Special Diet?: No Do You Have Any Trouble Sleeping?: Yes   CCA Employment/Education Employment/Work Situation: Employment / Work Situation Employment Situation: Unemployed Patient's Job has Been Impacted by Current Illness: No  Education: Education Is Patient Currently Attending School?: No Last Grade Completed: 9 Did Garment/textile technologist From [employer]?: No   CCA Family/Childhood History Family and Relationship History: Family history Marital status: Single Does patient have children?: Yes How many children?: 8  Childhood History:  Childhood History Additional childhood history information: client reported he was born and raised in [place]. client reported he was raised by his mother and grandmother. client reported his childhood was average. Does patient have siblings?: Yes Number of Siblings: 4 Description of patient's current relationship with siblings: client reported he has 3 sisters and 1 brother. client reported they are close in relationship. Did patient suffer any verbal/emotional/physical/sexual abuse as a child?: No Did patient suffer from severe childhood neglect?: No Has patient ever been sexually abused/assaulted/raped  as an adolescent or adult?: No Was the patient ever a victim of a crime or a disaster?: No Witnessed domestic violence?: No Has patient been affected by domestic violence as an adult?: No  Child/Adolescent Assessment:     CCA Substance Use Alcohol/Drug Use: Alcohol / Drug Use History of alcohol / drug use?: No history of alcohol / drug abuse                         ASAM's:  Six Dimensions of Multidimensional Assessment  Dimension 1:  Acute Intoxication and/or Withdrawal  Potential:      Dimension 2:  Biomedical Conditions and Complications:      Dimension 3:  Emotional, Behavioral, or Cognitive Conditions and Complications:     Dimension 4:  Readiness to Change:     Dimension 5:  Relapse, Continued use, or Continued Problem Potential:     Dimension 6:  Recovery/Living Environment:     ASAM Severity Score:    ASAM Recommended Level of Treatment:     Substance use Disorder (SUD)    Recommendations for Services/Supports/Treatments: Recommendations for Services/Supports/Treatments Recommendations For Services/Supports/Treatments: Medication Management, Individual Therapy  DSM5 Diagnoses: Patient Active Problem List   Diagnosis Date Noted   Cannabis use disorder 06/14/2023   Generalized anxiety disorder 04/19/2023   Severe episode of recurrent major depressive disorder, without psychotic features (HCC) 04/19/2023   Left spastic hemiparesis (HCC) 08/05/2022   HTN (hypertension) 07/08/2022   Acute renal failure superimposed on chronic kidney disease (HCC) 07/08/2022   Elevated lipids 07/08/2022   Right basal ganglia embolic stroke (HCC) 07/04/2022   Acute ischemic stroke (HCC) 06/30/2022   Noncompliance with medication regimen 06/30/2022   Essential hypertension 11/03/2021   Tobacco abuse 12/21/2011   VERTIGO NOS OR DIZZINESS 07/13/2006    Patient Centered Plan: Patient is on the following Treatment Plan(s):  Depression   Referrals to Alternative Service(s): Referred to Alternative Service(s):   Place:   Date:   Time:    Referred to Alternative Service(s):   Place:   Date:   Time:    Referred to Alternative Service(s):   Place:   Date:   Time:    Referred to Alternative Service(s):   Place:   Date:   Time:      Collaboration of Care: Referral or follow-up with counselor/therapist [facility]  Patient/Guardian was advised Release of Information must be obtained prior to any record release in order to collaborate their care with an outside  provider. Patient/Guardian was advised if they have not already done so to contact the registration department to sign all necessary forms in order for [place] to release information regarding their care.   Consent: Patient/Guardian gives verbal consent for treatment and assignment of benefits for services provided during this visit. Patient/Guardian expressed understanding and agreed to proceed.   Neena Rhymes Kajuana Shareef, LCSW

## 2023-07-12 ENCOUNTER — Telehealth (INDEPENDENT_AMBULATORY_CARE_PROVIDER_SITE_OTHER): Payer: Self-pay | Admitting: Primary Care

## 2023-07-12 ENCOUNTER — Telehealth (HOSPITAL_COMMUNITY): Payer: Self-pay

## 2023-07-12 ENCOUNTER — Ambulatory Visit (INDEPENDENT_AMBULATORY_CARE_PROVIDER_SITE_OTHER): Payer: Medicaid Other | Admitting: Primary Care

## 2023-07-12 NOTE — Telephone Encounter (Signed)
 Called to see if pt wanted to reschedule atp. Pt did not answer but VM was left for pt.

## 2023-07-12 NOTE — Telephone Encounter (Signed)
 Unable to reach Jonathan [name] after several attempts. He also no showed to PCP appointment today. Provider and RN CM in agreement to discharge from paramedicine program at this time.   Maralyn Sago, EMT-Paramedic [phone] 07/12/2023

## 2023-07-31 NOTE — Progress Notes (Unsigned)
 Patient did not connect for virtual psychiatric medication management appointment on 08/02/23 at 11AM. Sent secure video link with no response. Called phone with no answer; left VM with callback number to reschedule.  Daine Gip, MD 08/02/23

## 2023-08-02 ENCOUNTER — Encounter (HOSPITAL_COMMUNITY): Payer: Self-pay

## 2023-08-02 ENCOUNTER — Encounter (HOSPITAL_COMMUNITY): Payer: Medicaid Other | Admitting: Psychiatry

## 2023-08-14 ENCOUNTER — Ambulatory Visit (HOSPITAL_COMMUNITY): Payer: Medicaid Other | Admitting: Clinical

## 2023-08-14 ENCOUNTER — Encounter (HOSPITAL_COMMUNITY): Payer: Self-pay

## 2023-08-22 NOTE — Progress Notes (Deleted)
 NEUROLOGY FOLLOW UP OFFICE NOTE  Jonathan [name] [id number]  Assessment/Plan:   Right basal ganglia infarct secondary to small vessel disease Hypertension Hyperlipidemia Prediabetes Obstructive sleep apnea Tobacco use disorder/smoker Right lumbar radiculopathy Cocaine use, history of   Refer to PT/OT: To restart treatment of left sided weakness For treatment of right lumbar radiculopathy Secondary stroke prevention as managed by PCP: ASA 81mg  daily Statin.  LDL goal less than 70 Normotensive blood pressure Hgb A1c goal less than 7 Lifestyle modification: Smoking and drug use cessation CPAP Mediterranean diet Cardiovascular exercise Follow up 6 months.  Total time spent reviewing records, imaging and face to face with patient:  46 minutes.   Subjective:  Jonathan [name] is a 48 year old right-handed male with hypertension and smoking who presents follows up for stroke.  UPDATE: Current medication:  ASA 81mg  daily, atorvastatin 20mg  daily, carvedilol, amlodipine, irbesartan, gabapentin 100mg  at bedtime, fluoxetine 20mg  daily, baclofen 5mg  TID PRN  Referred to PT/OT for residual post-stroke left sided weakness and for right lumbar radiculopathy.  ***  HISTORY:  On 06/30/2022, he developed left upper and lower extremity weakness and numbness.  Presented to [facility].  NIHSS was 3.  Outside therapeutic window for tPA.  CT head showed small acute right basal ganglia infarct confirmed on subsequent MRI.  CTA head and neck showed mild atheromatous changes but no LVO or hemodynamically significant stenosis.  2D eco showed EF 55-60% without atrial level shunt or other cardiac source of emboli.Jonathan [name]  LDL was 81 and Hgb A1c was 5.9.  Urine drug screen was positive for cocaine.  Started and discharged on ASA and Plavix DAPT for 3 weeks followed by Plavix alone.  He was also started on atorvastatin 20mg  daily.   He has gone to PT/OT.  Still has residual left arm and  leg weakness and ambulates with cane.  He has since cut down his smoking cigarettes to 5 a day.  He no longer uses illicit drugs.    However, he started experiencing numbness in the right leg about in late 2023.  It involves the lateral and anterior thigh.  He has shooting pain and spasms along the lateral thigh down to the ankle.  No back pain.  Pain is worse when he stretches the leg.  No associated weakness.  Takes gabapentin 100mg  at bedtime.    PAST MEDICAL HISTORY: Past Medical History:  Diagnosis Date   Anxiety    Chest pain 12/21/2011   Depression    Hypertension    Insomnia    Lower back pain     MEDICATIONS: Current Outpatient Medications on File Prior to Visit  Medication Sig Dispense Refill   acetaminophen (TYLENOL) 325 MG tablet Take 1-2 tablets (325-650 mg total) by mouth every 4 (four) hours as needed for mild pain.     amLODipine (NORVASC) 10 MG tablet Take 1 tablet (10 mg total) by mouth daily. 90 tablet 1   aspirin (ASPIRIN LOW DOSE) 81 MG chewable tablet Chew 1 tablet (81 mg total) by mouth daily. 30 tablet 0   atorvastatin (LIPITOR) 20 MG tablet Take 1 tablet (20 mg total) by mouth daily. 30 tablet 0   Baclofen 5 MG TABS Take 1 tablet (5 mg total) by mouth 3 (three) times daily as needed. 90 tablet 5   carvedilol (COREG) 6.25 MG tablet Take 1 tablet (6.25 mg total) by mouth 2 (two) times daily with a meal. 180 tablet 1   ergocalciferol (VITAMIN D2) 1.25  MG (50000 UT) capsule Take 1 capsule (50,000 Units total) by mouth once a week. 8 capsule 0   FLUoxetine (PROZAC) 20 MG capsule Take 1 capsule (20 mg total) by mouth daily. 30 capsule 2   gabapentin (NEURONTIN) 100 MG capsule Take 1 capsule (100 mg total) by mouth at bedtime. 90 capsule 1   irbesartan (AVAPRO) 75 MG tablet Take 1 tablet (75 mg total) by mouth daily. 90 tablet 1   Multiple Vitamins-Minerals (MULTIVITAMIN MEN) TABS Take 1 tablet by mouth daily with breakfast.     traZODone (DESYREL) 100 MG tablet Take 1  tablet (100 mg total) by mouth at bedtime. 90 tablet 1   No current facility-administered medications on file prior to visit.    ALLERGIES: No Known Allergies  FAMILY HISTORY: Family History  Problem Relation Age of Onset   Migraines Mother    Cancer Mother    Diabetes Maternal Grandmother    Hypertension Other       Objective:  *** General: No acute distress.  Patient appears ***-groomed.   Head:  Normocephalic/atraumatic Eyes:  Fundi examined but not visualized Neck: supple, no paraspinal tenderness, full range of motion Heart:  Regular rate and rhythm Lungs:  Clear to auscultation bilaterally Back: No paraspinal tenderness Neurological Exam: alert and oriented.  Speech fluent and not dysarthric, language intact.  CN II-XII intact. Bulk and tone normal, muscle strength 5/5 throughout.  Sensation to light touch intact.  Deep tendon reflexes 2+ throughout, toes downgoing.  Finger to nose testing intact.  Gait normal, Romberg negative.   Jonathan Millet, DO  CC: ***

## 2023-08-23 ENCOUNTER — Encounter: Payer: Self-pay | Admitting: Neurology

## 2023-08-23 ENCOUNTER — Ambulatory Visit: Payer: Medicaid Other | Admitting: Neurology

## 2023-09-04 ENCOUNTER — Ambulatory Visit (HOSPITAL_COMMUNITY): Payer: Medicaid Other | Admitting: Clinical

## 2023-09-06 ENCOUNTER — Telehealth (INDEPENDENT_AMBULATORY_CARE_PROVIDER_SITE_OTHER): Payer: Self-pay | Admitting: Primary Care

## 2023-09-06 NOTE — Telephone Encounter (Signed)
 Called pt to confirm appt. LVM for pt

## 2023-09-13 ENCOUNTER — Ambulatory Visit (INDEPENDENT_AMBULATORY_CARE_PROVIDER_SITE_OTHER): Admitting: Primary Care

## 2023-09-13 ENCOUNTER — Encounter (INDEPENDENT_AMBULATORY_CARE_PROVIDER_SITE_OTHER): Payer: Self-pay | Admitting: Primary Care

## 2023-09-13 VITALS — BP 146/88 | HR 87 | Resp 16 | Ht 72.0 in | Wt 251.8 lb

## 2023-09-13 DIAGNOSIS — I10 Essential (primary) hypertension: Secondary | ICD-10-CM

## 2023-09-13 DIAGNOSIS — E785 Hyperlipidemia, unspecified: Secondary | ICD-10-CM | POA: Diagnosis not present

## 2023-09-13 DIAGNOSIS — R7309 Other abnormal glucose: Secondary | ICD-10-CM | POA: Diagnosis not present

## 2023-09-13 DIAGNOSIS — E559 Vitamin D deficiency, unspecified: Secondary | ICD-10-CM

## 2023-09-13 DIAGNOSIS — Z1211 Encounter for screening for malignant neoplasm of colon: Secondary | ICD-10-CM

## 2023-09-13 DIAGNOSIS — R202 Paresthesia of skin: Secondary | ICD-10-CM

## 2023-09-13 IMAGING — DX DG HIP (WITH OR WITHOUT PELVIS) 2-3V*L*
4 series · 4 of 4 positions shown · non-contrast
Comparison: None Available.

CLINICAL DATA: [id number]

EXAM:
DG HIP (WITH OR WITHOUT PELVIS) 2-3V LEFT

[pelvis ap]
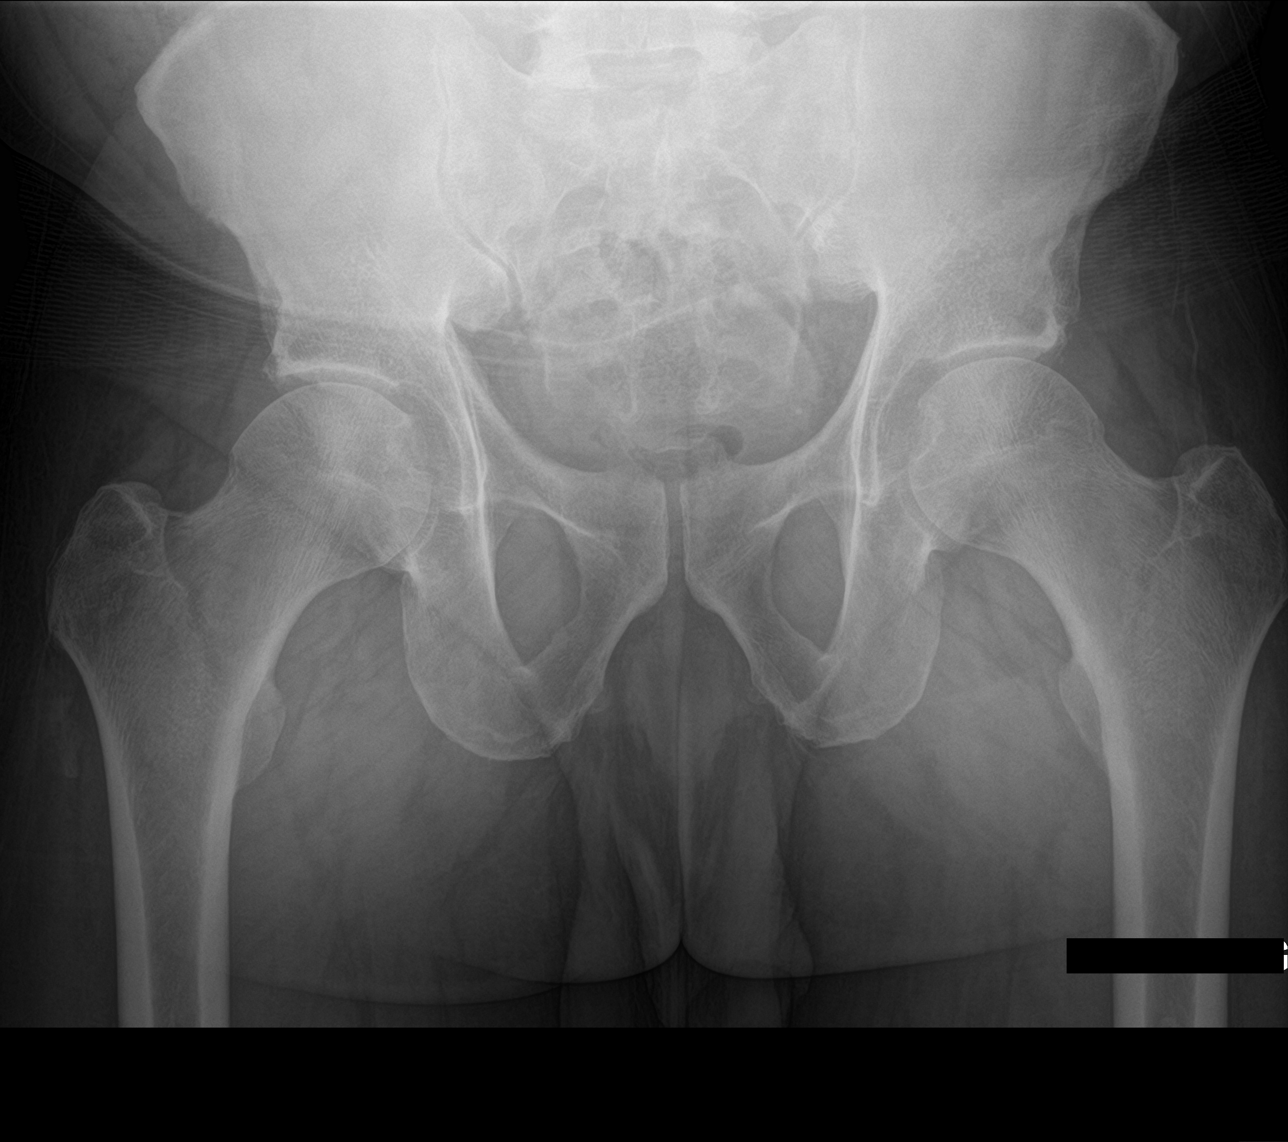

[hip ap (1 of 2)]
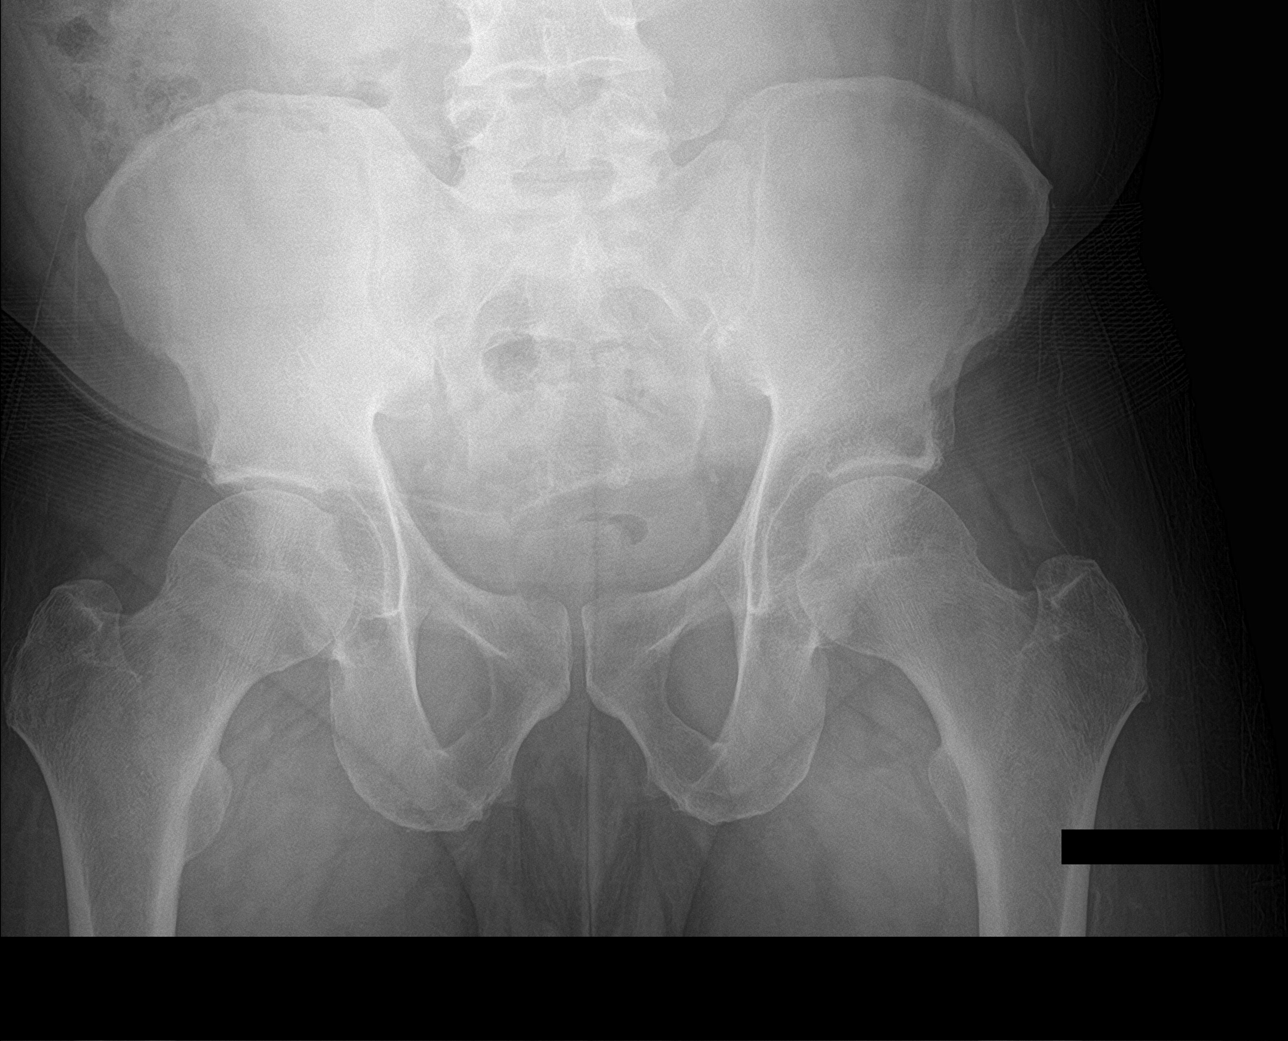

[hip lat]
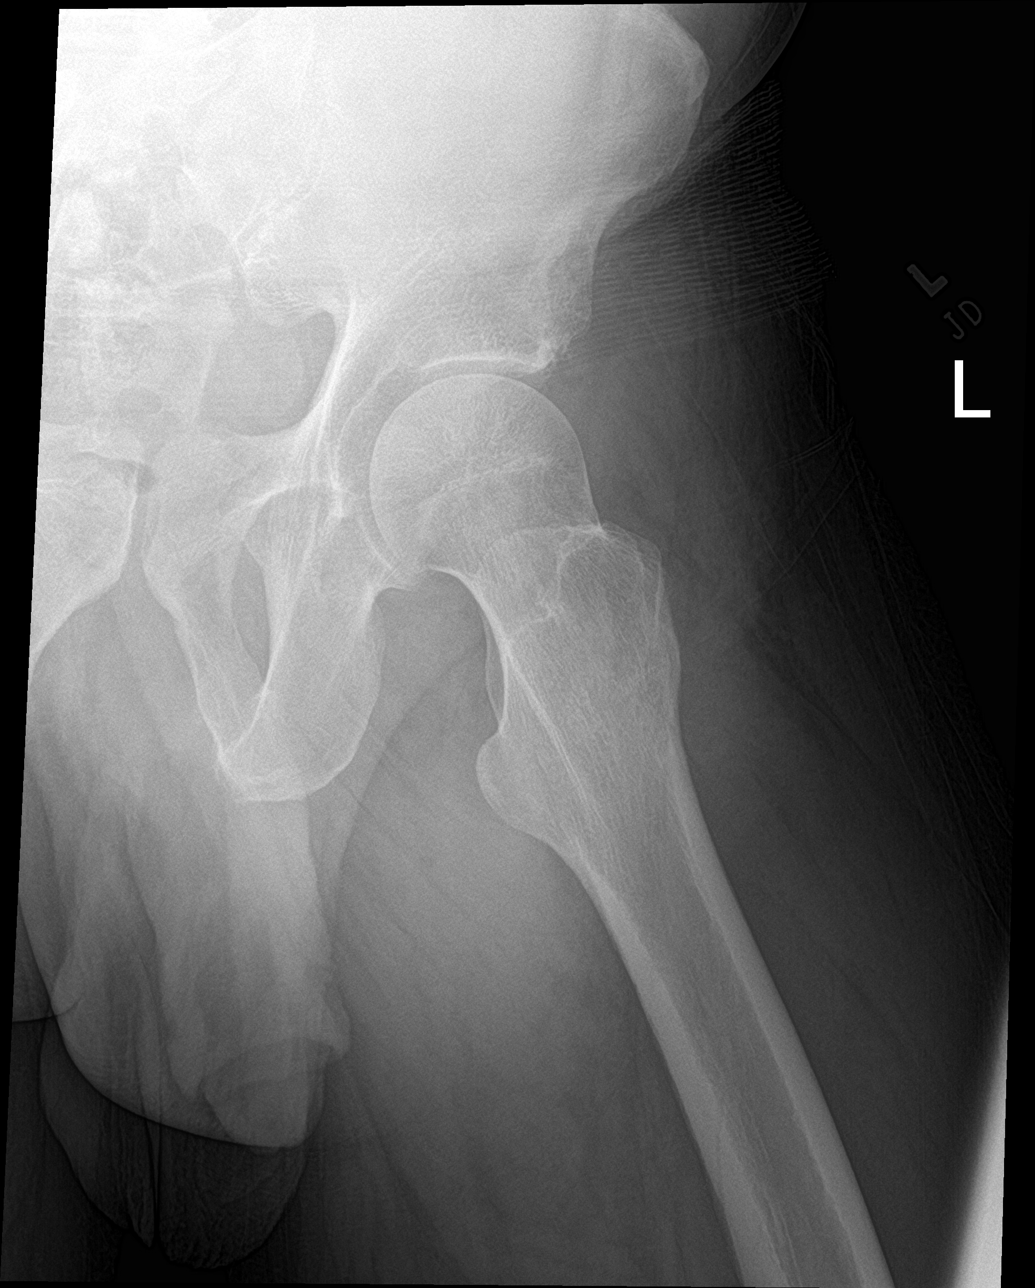

[hip ap (2 of 2)]
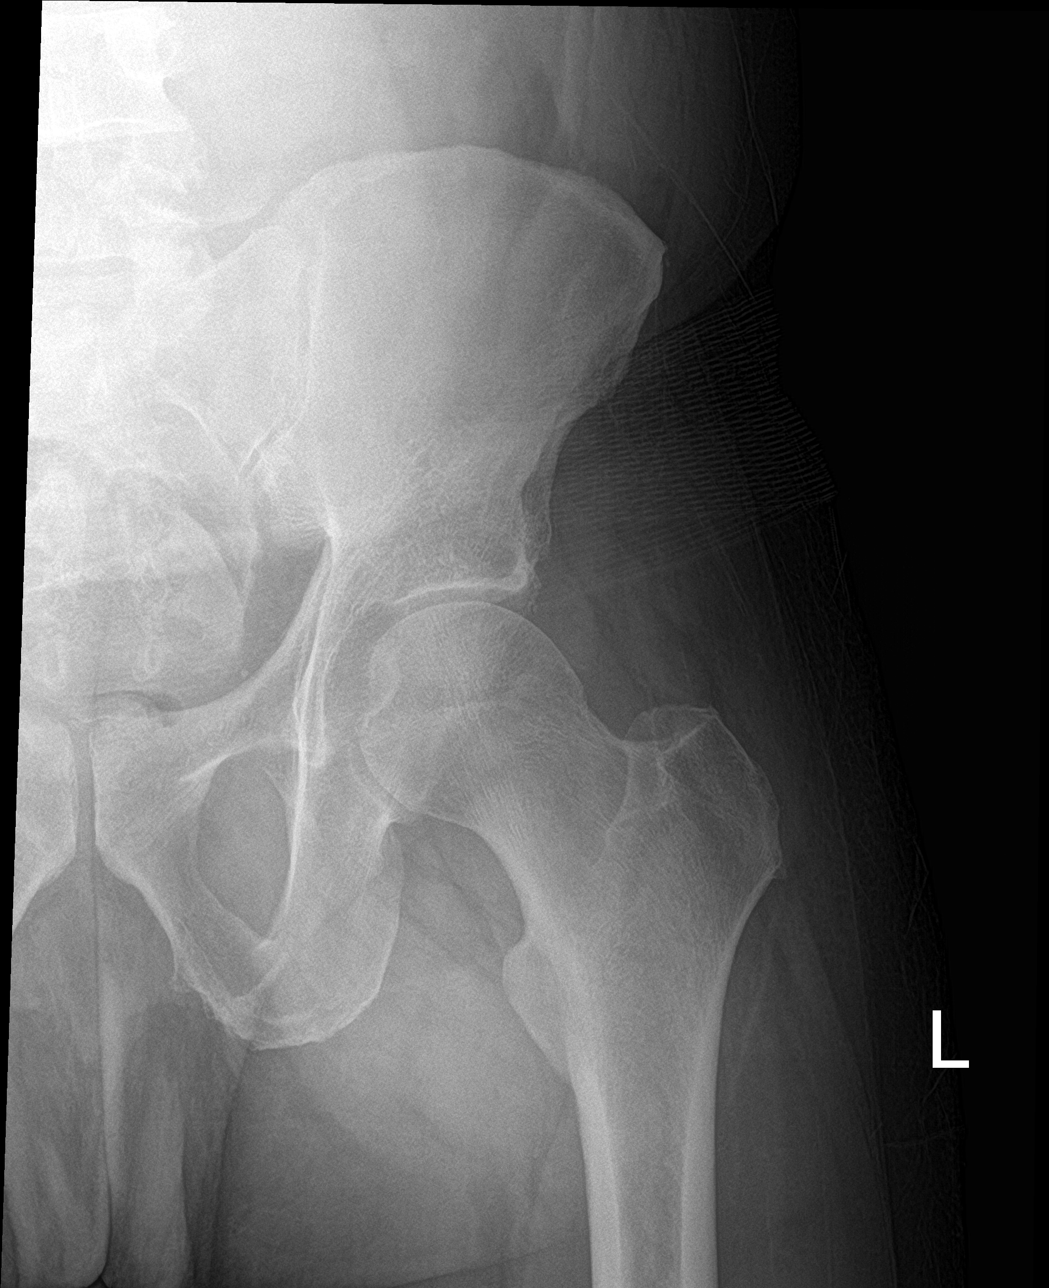

[4 of 4 positions shown; findings below may reference images not displayed]

FINDINGS: There is no evidence of hip fracture or dislocation. There is no
evidence of arthropathy or other focal bone abnormality.
IMPRESSION: Negative.

## 2023-09-13 MED ORDER — CLONIDINE HCL 0.1 MG PO TABS
0.1000 mg | ORAL_TABLET | Freq: Once | ORAL | Status: AC
  Administered 2023-09-13: 0.1 mg via ORAL

## 2023-09-13 NOTE — Progress Notes (Signed)
 [facility]  Jonathan [name], is a 48 y.o. male  [id number]  [id number]  DOB - 09-05-1975  Chief Complaint  Patient presents with   Hypertension    Didn't take bp medication this morning    Eye Problem    Started 3 weeks ago Watery Goggey Crust        Subjective:   Jonathan [name] is a 48 y.o. male here today for management of hypertension.  Patient worked all night presented to his office visit did not take his medication blood pressure is 167/106.  He has a headache, right arm numbness and tingling.  Denies chest pain, shortness of breath or any radiating factors.  Patient also is concerned about drainage from eyes which started approximately 3 weeks ago watery, goopy stuff in the corner of Eyes and Crusty.  Negative for phalen and tinel test no problem with grasping monitor.  No problems updated.  Comprehensive ROS Pertinent positive and negative noted in HPI   No Known Allergies  Past Medical History:  Diagnosis Date   Anxiety    Chest pain 12/21/2011   Depression    Hypertension    Insomnia    Lower back pain     Current Outpatient Medications on File Prior to Visit  Medication Sig Dispense Refill   acetaminophen  (TYLENOL ) 325 MG tablet Take 1-2 tablets (325-650 mg total) by mouth every 4 (four) hours as needed for mild pain.     amLODipine  (NORVASC ) 10 MG tablet Take 1 tablet (10 mg total) by mouth daily. 90 tablet 1   aspirin  (ASPIRIN  LOW DOSE) 81 MG chewable tablet Chew 1 tablet (81 mg total) by mouth daily. 30 tablet 0   atorvastatin  (LIPITOR) 20 MG tablet Take 1 tablet (20 mg total) by mouth daily. 30 tablet 0   Baclofen  5 MG TABS Take 1 tablet (5 mg total) by mouth 3 (three) times daily as needed. 90 tablet 5   carvedilol  (COREG ) 6.25 MG tablet Take 1 tablet (6.25 mg total) by mouth 2 (two) times daily with a meal. 180 tablet 1   ergocalciferol  (VITAMIN D2) 1.25 MG (50000 UT) capsule Take 1 capsule (50,000 Units total) by mouth  once a week. 8 capsule 0   FLUoxetine  (PROZAC ) 20 MG capsule Take 1 capsule (20 mg total) by mouth daily. 30 capsule 2   gabapentin  (NEURONTIN ) 100 MG capsule Take 1 capsule (100 mg total) by mouth at bedtime. 90 capsule 1   irbesartan  (AVAPRO ) 75 MG tablet Take 1 tablet (75 mg total) by mouth daily. 90 tablet 1   Multiple Vitamins-Minerals (MULTIVITAMIN MEN) TABS Take 1 tablet by mouth daily with breakfast.     traZODone  (DESYREL ) 100 MG tablet Take 1 tablet (100 mg total) by mouth at bedtime. 90 tablet 1   No current facility-administered medications on file prior to visit.   Health Maintenance  Topic Date Due   Pneumococcal Vaccination (1 of 2 - PCV) Never done   Colon Cancer Screening  Never done   COVID-19 Vaccine (1 - 2024-25 season) Never done   DTaP/Tdap/Td vaccine (1 - Tdap) 03/20/2024*   Flu Shot  12/15/2023   Hepatitis C Screening  Completed   HIV Screening  Completed   HPV Vaccine  Aged Out   Meningitis B Vaccine  Aged Out  *Topic was postponed. The date shown is not the original due date.    Objective:   Vitals:   09/13/23 0931 09/13/23 1005  BP: (!) 168/105 (!) 167/106  Pulse:  87   Resp: 16   SpO2: 99%   Weight: 251 lb 12.8 oz (114.2 kg)   Height: 6' (1.829 m)    BP Readings from Last 3 Encounters:  09/13/23 (!) 167/106  04/20/23 (!) 168/106  04/11/23 (!) 154/82      Physical Exam Vitals reviewed.  Constitutional:      Appearance: He is obese.  HENT:     Head: Normocephalic.     Right Ear: Tympanic membrane and external ear normal.     Left Ear: Tympanic membrane and external ear normal.     Ears:     Comments: Canals red uses car keys, finger anything he can fine d/w ear drum damage loss of hearing    Nose: Nose normal.  Eyes:     Extraocular Movements: Extraocular movements intact.  Cardiovascular:     Rate and Rhythm: Normal rate and regular rhythm.  Pulmonary:     Effort: Pulmonary effort is normal.     Breath sounds: Normal breath sounds.   Abdominal:     General: Bowel sounds are normal. There is distension.     Palpations: Abdomen is soft.  Musculoskeletal:        General: Normal range of motion.     Cervical back: Normal range of motion and neck supple.  Skin:    General: Skin is warm and dry.  Neurological:     Mental Status: He is oriented to person, place, and time.  Psychiatric:        Mood and Affect: Mood normal.        Behavior: Behavior normal.       Assessment & Plan  Jonathan "Jonathan [name]" was seen today for hypertension and eye problem.  Diagnoses and all orders for this visit:  Elevated glucose -     CBC with Differential/Platelet -     Hemoglobin A1c  Essential hypertension BP goal - < 130/80 Explained that having normal blood pressure is the goal and medications are helping to get to goal and maintain normal blood pressure. DIET: Limit salt intake, read nutrition labels to check salt content, limit fried and high fatty foods  Avoid using multisymptom OTC cold preparations that generally contain sudafed which can rise BP. Consult with pharmacist on best cold relief products to use for persons with HTN EXERCISE Discussed incorporating exercise such as walking - 30 minutes most days of the week and can do in 10 minute intervals    -     CMP14+EGFR  Vitamin D  deficiency Vitamin D  is needed to make and keep bones strong. Patient only took prescription strength vitamin D  tablet once weekly.  Never purchased the 2000 daily.  -     Vitamin D , 25-hydroxy  Elevated lipids -     Lipid panel  Colon cancer screening -     Cologuard  Right hand paresthesia History of a right sided stroke unclear of etiology numbness and tingling in right hand good capillary refills Monitor and reevaluate after review of labs.    Patient have been counseled extensively about nutrition and exercise. Other issues discussed during this visit include: low cholesterol diet, weight control and daily exercise, foot care, annual  eye examinations at Ophthalmology, importance of adherence with medications and regular follow-up. We also discussed long term complications of uncontrolled diabetes and hypertension.   Patient is to return for blood pressure check up on medications not leaving work and having a good night rest of sleep.  He will make that  appointment when he leaves.  The patient was given clear instructions to go to ER or return to medical center if symptoms don't improve, worsen or new problems develop. The patient verbalized understanding. The patient was told to call to get lab results if they haven't heard anything in the next week.   This note has been created with Education officer, environmental. Any transcriptional errors are unintentional.   Marius Siemens, NP 09/13/2023, 10:26 AM

## 2023-09-13 NOTE — Addendum Note (Signed)
 Addended by: Kathryne Parisian on: 09/13/2023 06:22 PM   Modules accepted: Orders

## 2023-09-15 LAB — LIPID PANEL
Chol/HDL Ratio: 3.7 ratio (ref 0.0–5.0)
Cholesterol, Total: 156 mg/dL (ref 100–199)
HDL: 42 mg/dL (ref >39)
LDL Chol Calc (NIH): 96 mg/dL (ref 0–99)
Triglycerides: 95 mg/dL (ref 0–149)
VLDL Cholesterol Cal: 18 mg/dL (ref 5–40)

## 2023-09-15 LAB — CMP14+EGFR
ALT: 15 IU/L (ref 0–44)
AST: 15 IU/L (ref 0–40)
Albumin: 4.3 g/dL (ref 4.1–5.1)
Alkaline Phosphatase: 100 IU/L (ref 44–121)
BUN/Creatinine Ratio: 9 (ref 9–20)
BUN: 13 mg/dL (ref 6–24)
Bilirubin Total: 0.2 mg/dL (ref 0.0–1.2)
CO2: 23 mmol/L (ref 20–29)
Calcium: 9.6 mg/dL (ref 8.7–10.2)
Chloride: 106 mmol/L (ref 96–106)
Creatinine, Ser: 1.37 mg/dL — ABNORMAL HIGH (ref 0.76–1.27)
Globulin, Total: 3.1 g/dL (ref 1.5–4.5)
Glucose: 112 mg/dL — ABNORMAL HIGH (ref 70–99)
Potassium: 4.3 mmol/L (ref 3.5–5.2)
Sodium: 143 mmol/L (ref 134–144)
Total Protein: 7.4 g/dL (ref 6.0–8.5)
eGFR: 64 mL/min/{1.73_m2} (ref >59)

## 2023-09-15 LAB — CBC WITH DIFFERENTIAL/PLATELET
Basophils Absolute: 0 10*3/uL (ref 0.0–0.2)
Basos: 0 %
EOS (ABSOLUTE): 0.1 10*3/uL (ref 0.0–0.4)
Eos: 1 %
Hematocrit: 46.6 % (ref 37.5–51.0)
Hemoglobin: 14.6 g/dL (ref 13.0–17.7)
Immature Grans (Abs): 0 10*3/uL (ref 0.0–0.1)
Immature Granulocytes: 0 %
Lymphocytes Absolute: 1.8 10*3/uL (ref 0.7–3.1)
Lymphs: 24 %
MCH: 30.2 pg (ref 26.6–33.0)
MCHC: 31.3 g/dL — ABNORMAL LOW (ref 31.5–35.7)
MCV: 96 fL (ref 79–97)
Monocytes Absolute: 0.5 10*3/uL (ref 0.1–0.9)
Monocytes: 7 %
Neutrophils Absolute: 4.9 10*3/uL (ref 1.4–7.0)
Neutrophils: 68 %
Platelets: 263 10*3/uL (ref 150–450)
RBC: 4.84 x10E6/uL (ref 4.14–5.80)
RDW: 13.6 % (ref 11.6–15.4)
WBC: 7.3 10*3/uL (ref 3.4–10.8)

## 2023-09-15 LAB — HEMOGLOBIN A1C
Est. average glucose Bld gHb Est-mCnc: 137 mg/dL
Hgb A1c MFr Bld: 6.4 % — ABNORMAL HIGH (ref 4.8–5.6)

## 2023-09-15 LAB — VITAMIN D 25 HYDROXY (VIT D DEFICIENCY, FRACTURES): Vit D, 25-Hydroxy: 16.7 ng/mL — ABNORMAL LOW (ref 30.0–100.0)

## 2023-09-18 ENCOUNTER — Encounter: Payer: Self-pay | Admitting: Nurse Practitioner

## 2023-09-18 ENCOUNTER — Other Ambulatory Visit: Payer: Self-pay | Admitting: Nurse Practitioner

## 2023-09-18 DIAGNOSIS — E559 Vitamin D deficiency, unspecified: Secondary | ICD-10-CM

## 2023-09-18 MED ORDER — ERGOCALCIFEROL 1.25 MG (50000 UT) PO CAPS: 50000.0000 [IU] | ORAL_CAPSULE | ORAL | 0 refills | Status: AC

## 2023-11-30 ENCOUNTER — Other Ambulatory Visit (INDEPENDENT_AMBULATORY_CARE_PROVIDER_SITE_OTHER): Payer: Self-pay | Admitting: Primary Care

## 2023-12-01 NOTE — Telephone Encounter (Signed)
 Requested medications are due for refill today.  no  Requested medications are on the active medications list.  no  Last refill. never  Future visit scheduled.   no  Notes to clinic.  Refill not delegated.    Requested Prescriptions  Pending Prescriptions Disp Refills   amoxicillin  (AMOXIL ) 500 MG capsule [Pharmacy Med Name: AMOXICILLIN  500 MG CAPSULE] 30 capsule     Sig: TAKE 1 CAPSULE BY MOUTH THREE TIMES A DAY FOR 10 DAYS     Off-Protocol Failed - 12/01/2023  2:12 PM      Failed - Medication not assigned to a protocol, review manually.      Passed - Valid encounter within last 12 months    Recent Outpatient Visits           2 months ago Elevated glucose   [place] [facility] Celestia Rosaline SQUIBB, NP   8 months ago Influenza vaccination declined   [place] [facility] Celestia Rosaline SQUIBB, NP   11 months ago Essential hypertension   [place] [facility] Celestia Rosaline SQUIBB, NP   1 year ago Essential hypertension   [place] [facility] Celestia Rosaline SQUIBB, NP

## 2023-12-07 ENCOUNTER — Other Ambulatory Visit: Payer: Self-pay | Admitting: Nurse Practitioner

## 2023-12-07 DIAGNOSIS — E559 Vitamin D deficiency, unspecified: Secondary | ICD-10-CM

## 2024-02-19 ENCOUNTER — Other Ambulatory Visit (INDEPENDENT_AMBULATORY_CARE_PROVIDER_SITE_OTHER): Payer: Self-pay | Admitting: Primary Care

## 2024-02-19 DIAGNOSIS — I10 Essential (primary) hypertension: Secondary | ICD-10-CM

## 2024-02-20 NOTE — Telephone Encounter (Signed)
 Requested Prescriptions  Pending Prescriptions Disp Refills   gabapentin  (NEURONTIN ) 100 MG capsule [Pharmacy Med Name: GABAPENTIN  100 MG CAPSULE] 90 capsule 0    Sig: TAKE 1 CAPSULE BY MOUTH AT BEDTIME.     Neurology: Anticonvulsants - gabapentin  Failed - 02/20/2024 12:49 PM      Failed - Cr in normal range and within 360 days    Creatinine, Ser  Date Value Ref Range Status  09/13/2023 1.37 (H) 0.76 - 1.27 mg/dL Final         Passed - Completed PHQ-2 or PHQ-9 in the last 360 days      Passed - Valid encounter within last 12 months    Recent Outpatient Visits           5 months ago Elevated glucose   [place] [facility] Jonathan Rosaline SQUIBB, NP   11 months ago Influenza vaccination declined   [place] [facility] Jonathan Rosaline SQUIBB, NP   1 year ago Essential hypertension   [place] [facility] Jonathan Rosaline SQUIBB, NP   1 year ago Essential hypertension   [place] [facility] Jonathan Rosaline SQUIBB, NP               irbesartan  (AVAPRO ) 75 MG tablet [Pharmacy Med Name: IRBESARTAN  75 MG TABLET] 90 tablet 0    Sig: TAKE 1 TABLET BY MOUTH EVERY DAY     Cardiovascular:  Angiotensin Receptor Blockers Failed - 02/20/2024 12:49 PM      Failed - Cr in normal range and within 180 days    Creatinine, Ser  Date Value Ref Range Status  09/13/2023 1.37 (H) 0.76 - 1.27 mg/dL Final         Failed - Last BP in normal range    BP Readings from Last 1 Encounters:  09/13/23 (!) 146/88         Passed - K in normal range and within 180 days    Potassium  Date Value Ref Range Status  09/13/2023 4.3 3.5 - 5.2 mmol/L Final         Passed - Patient is not pregnant      Passed - Valid encounter within last 6 months    Recent Outpatient Visits           5 months ago Elevated glucose   [place] [facility] Jonathan Rosaline SQUIBB, NP   11 months ago Influenza vaccination declined   [place]  [facility] Jonathan Rosaline SQUIBB, NP   1 year ago Essential hypertension   [place] [facility] Jonathan Rosaline SQUIBB, NP   1 year ago Essential hypertension   [place] [facility] Jonathan Rosaline SQUIBB, NP               carvedilol  (COREG ) 6.25 MG tablet [Pharmacy Med Name: CARVEDILOL  6.25 MG TABLET] 180 tablet 0    Sig: TAKE 1 TABLET BY MOUTH 2 TIMES DAILY WITH A MEAL.     Cardiovascular: Beta Blockers 3 Failed - 02/20/2024 12:49 PM      Failed - Cr in normal range and within 360 days    Creatinine, Ser  Date Value Ref Range Status  09/13/2023 1.37 (H) 0.76 - 1.27 mg/dL Final         Failed - Last BP in normal range    BP Readings from Last 1 Encounters:  09/13/23 (!) 146/88         Passed -  AST in normal range and within 360 days    AST  Date Value Ref Range Status  09/13/2023 15 0 - 40 IU/L Final         Passed - ALT in normal range and within 360 days    ALT  Date Value Ref Range Status  09/13/2023 15 0 - 44 IU/L Final         Passed - Last Heart Rate in normal range    Pulse Readings from Last 1 Encounters:  09/13/23 87         Passed - Valid encounter within last 6 months    Recent Outpatient Visits           5 months ago Elevated glucose   [place] [facility] Jonathan Rosaline SQUIBB, NP   11 months ago Influenza vaccination declined   [place] [facility] Jonathan Rosaline SQUIBB, NP   1 year ago Essential hypertension   [place] [facility] Jonathan Rosaline SQUIBB, NP   1 year ago Essential hypertension    [facility] Jonathan Rosaline SQUIBB, NP

## 2024-04-29 ENCOUNTER — Other Ambulatory Visit (INDEPENDENT_AMBULATORY_CARE_PROVIDER_SITE_OTHER): Payer: Self-pay

## 2024-04-29 DIAGNOSIS — I10 Essential (primary) hypertension: Secondary | ICD-10-CM

## 2024-05-03 ENCOUNTER — Other Ambulatory Visit (INDEPENDENT_AMBULATORY_CARE_PROVIDER_SITE_OTHER): Payer: Self-pay

## 2024-05-03 DIAGNOSIS — I10 Essential (primary) hypertension: Secondary | ICD-10-CM

## 2024-05-05 ENCOUNTER — Other Ambulatory Visit (INDEPENDENT_AMBULATORY_CARE_PROVIDER_SITE_OTHER): Payer: Self-pay | Admitting: Primary Care

## 2024-05-05 DIAGNOSIS — I10 Essential (primary) hypertension: Secondary | ICD-10-CM

## 2024-05-05 MED ORDER — IRBESARTAN 75 MG PO TABS: 75.0000 mg | ORAL_TABLET | Freq: Every day | ORAL | 1 refills | Status: AC

## 2024-05-14 ENCOUNTER — Other Ambulatory Visit (INDEPENDENT_AMBULATORY_CARE_PROVIDER_SITE_OTHER): Payer: Self-pay

## 2024-05-14 DIAGNOSIS — I10 Essential (primary) hypertension: Secondary | ICD-10-CM

## 2024-05-14 MED ORDER — AMLODIPINE BESYLATE 10 MG PO TABS: 10.0000 mg | ORAL_TABLET | Freq: Every day | ORAL | 0 refills | Status: AC

## 2024-05-14 MED ORDER — CARVEDILOL 6.25 MG PO TABS: 6.2500 mg | ORAL_TABLET | Freq: Two times a day (BID) | ORAL | 0 refills | Status: AC
